# Patient Record
Sex: Female | Born: 1945 | Race: Black or African American
Health system: Southern US, Community
[De-identification: ages and names within clinical notes are randomized; demographics above are authoritative.]

## PROBLEM LIST (undated history)

## (undated) DIAGNOSIS — D649 Anemia, unspecified: Secondary | ICD-10-CM

## (undated) DIAGNOSIS — I499 Cardiac arrhythmia, unspecified: Secondary | ICD-10-CM

## (undated) DIAGNOSIS — J302 Other seasonal allergic rhinitis: Secondary | ICD-10-CM

## (undated) DIAGNOSIS — N289 Disorder of kidney and ureter, unspecified: Secondary | ICD-10-CM

## (undated) DIAGNOSIS — E119 Type 2 diabetes mellitus without complications: Secondary | ICD-10-CM

## (undated) DIAGNOSIS — F419 Anxiety disorder, unspecified: Secondary | ICD-10-CM

## (undated) DIAGNOSIS — M069 Rheumatoid arthritis, unspecified: Secondary | ICD-10-CM

## (undated) DIAGNOSIS — G629 Polyneuropathy, unspecified: Secondary | ICD-10-CM

## (undated) DIAGNOSIS — E079 Disorder of thyroid, unspecified: Secondary | ICD-10-CM

## (undated) DIAGNOSIS — I272 Pulmonary hypertension, unspecified: Secondary | ICD-10-CM

## (undated) DIAGNOSIS — E785 Hyperlipidemia, unspecified: Secondary | ICD-10-CM

## (undated) DIAGNOSIS — J349 Unspecified disorder of nose and nasal sinuses: Secondary | ICD-10-CM

## (undated) DIAGNOSIS — M13811 Other specified arthritis, right shoulder: Secondary | ICD-10-CM

## (undated) DIAGNOSIS — H9390 Unspecified disorder of ear, unspecified ear: Secondary | ICD-10-CM

## (undated) DIAGNOSIS — I10 Essential (primary) hypertension: Secondary | ICD-10-CM

## (undated) DIAGNOSIS — J392 Other diseases of pharynx: Secondary | ICD-10-CM

## (undated) DIAGNOSIS — E039 Hypothyroidism, unspecified: Secondary | ICD-10-CM

## (undated) DIAGNOSIS — F329 Major depressive disorder, single episode, unspecified: Secondary | ICD-10-CM

## (undated) DIAGNOSIS — F32A Depression, unspecified: Secondary | ICD-10-CM

## (undated) HISTORY — DX: Polyneuropathy, unspecified: G62.9

## (undated) HISTORY — DX: Hypothyroidism, unspecified: E03.9

## (undated) HISTORY — DX: Disorder of thyroid, unspecified: E07.9

## (undated) HISTORY — PX: ROTATOR CUFF REPAIR: SHX139

## (undated) HISTORY — DX: Type 2 diabetes mellitus without complications: E11.9

## (undated) HISTORY — DX: Disorder of kidney and ureter, unspecified: N28.9

## (undated) HISTORY — DX: Pulmonary hypertension, unspecified: I27.20

## (undated) HISTORY — PX: EYE SURGERY: SHX253

## (undated) HISTORY — PX: FINGER SURGERY: SHX640

## (undated) HISTORY — DX: Essential (primary) hypertension: I10

## (undated) HISTORY — DX: Anemia, unspecified: D64.9

## (undated) HISTORY — DX: Anxiety disorder, unspecified: F41.9

## (undated) HISTORY — PX: SHOULDER SURGERY: SHX246

## (undated) HISTORY — DX: Hyperlipidemia, unspecified: E78.5

## (undated) HISTORY — DX: Unspecified disorder of ear, unspecified ear: H93.90

## (undated) HISTORY — DX: Cardiac arrhythmia, unspecified: I49.9

## (undated) HISTORY — DX: Rheumatoid arthritis, unspecified: M06.9

## (undated) HISTORY — DX: Other diseases of pharynx: J39.2

## (undated) HISTORY — DX: Other specified arthritis, right shoulder: M13.811

## (undated) HISTORY — DX: Unspecified disorder of nose and nasal sinuses: J34.9

## (undated) HISTORY — PX: HYSTERECTOMY: SHX81

## (undated) HISTORY — DX: Other seasonal allergic rhinitis: J30.2

## (undated) HISTORY — PX: ABDOMINAL HYSTERECTOMY: SHX81

---

## 1994-12-31 ENCOUNTER — Ambulatory Visit: Admission: RE | Admit: 1994-12-31 | Payer: Self-pay | Source: Ambulatory Visit | Admitting: Pain Medicine

## 1995-01-14 ENCOUNTER — Ambulatory Visit: Admission: RE | Admit: 1995-01-14 | Payer: Self-pay | Source: Ambulatory Visit | Admitting: Pain Medicine

## 1995-01-28 ENCOUNTER — Ambulatory Visit: Admission: RE | Admit: 1995-01-28 | Payer: Self-pay | Source: Ambulatory Visit | Admitting: Pain Medicine

## 1996-07-11 ENCOUNTER — Emergency Department: Admit: 1996-07-11 | Disposition: A | Discharge status: Home / Self Care | Payer: Self-pay

## 1998-10-20 ENCOUNTER — Ambulatory Visit: Admit: 1998-10-20 | Disposition: A | Discharge status: Home / Self Care | Payer: Self-pay

## 1999-06-01 ENCOUNTER — Ambulatory Visit: Admit: 1999-06-01 | Disposition: A | Discharge status: Home / Self Care | Payer: Self-pay

## 2000-04-28 ENCOUNTER — Emergency Department: Admit: 2000-04-28 | Payer: Self-pay | Admitting: Emergency Medicine

## 2005-02-10 ENCOUNTER — Ambulatory Visit
Admission: RE | Admit: 2005-02-10 | Disposition: A | Discharge status: Home / Self Care | Payer: Self-pay | Source: Ambulatory Visit | Admitting: Internal Medicine

## 2005-03-06 ENCOUNTER — Ambulatory Visit
Admission: RE | Admit: 2005-03-06 | Disposition: A | Discharge status: Home / Self Care | Payer: Self-pay | Source: Ambulatory Visit | Admitting: Internal Medicine

## 2014-03-09 ENCOUNTER — Emergency Department (HOSPITAL_BASED_OUTPATIENT_CLINIC_OR_DEPARTMENT_OTHER)
Admission: EM | Admit: 2014-03-09 | Discharge: 2014-03-09 | Disposition: A | Discharge status: Home / Self Care | Payer: Medicare Other | Attending: Emergency Medicine | Admitting: Emergency Medicine

## 2014-03-09 ENCOUNTER — Encounter (HOSPITAL_BASED_OUTPATIENT_CLINIC_OR_DEPARTMENT_OTHER): Payer: Self-pay | Admitting: Emergency Medicine

## 2014-03-09 DIAGNOSIS — E119 Type 2 diabetes mellitus without complications: Secondary | ICD-10-CM | POA: Insufficient documentation

## 2014-03-09 DIAGNOSIS — F329 Major depressive disorder, single episode, unspecified: Secondary | ICD-10-CM | POA: Insufficient documentation

## 2014-03-09 DIAGNOSIS — Z79899 Other long term (current) drug therapy: Secondary | ICD-10-CM | POA: Insufficient documentation

## 2014-03-09 DIAGNOSIS — E785 Hyperlipidemia, unspecified: Secondary | ICD-10-CM | POA: Insufficient documentation

## 2014-03-09 DIAGNOSIS — Z7982 Long term (current) use of aspirin: Secondary | ICD-10-CM | POA: Insufficient documentation

## 2014-03-09 DIAGNOSIS — F411 Generalized anxiety disorder: Secondary | ICD-10-CM | POA: Insufficient documentation

## 2014-03-09 DIAGNOSIS — M069 Rheumatoid arthritis, unspecified: Secondary | ICD-10-CM | POA: Insufficient documentation

## 2014-03-09 DIAGNOSIS — J0101 Acute recurrent maxillary sinusitis: Secondary | ICD-10-CM

## 2014-03-09 DIAGNOSIS — H109 Unspecified conjunctivitis: Secondary | ICD-10-CM | POA: Insufficient documentation

## 2014-03-09 DIAGNOSIS — F3289 Other specified depressive episodes: Secondary | ICD-10-CM | POA: Insufficient documentation

## 2014-03-09 DIAGNOSIS — Z880 Allergy status to penicillin: Secondary | ICD-10-CM | POA: Insufficient documentation

## 2014-03-09 DIAGNOSIS — E079 Disorder of thyroid, unspecified: Secondary | ICD-10-CM | POA: Insufficient documentation

## 2014-03-09 DIAGNOSIS — J0100 Acute maxillary sinusitis, unspecified: Secondary | ICD-10-CM | POA: Insufficient documentation

## 2014-03-09 DIAGNOSIS — Z792 Long term (current) use of antibiotics: Secondary | ICD-10-CM | POA: Insufficient documentation

## 2014-03-09 HISTORY — DX: Rheumatoid arthritis, unspecified: M06.9

## 2014-03-09 HISTORY — DX: Type 2 diabetes mellitus without complications: E11.9

## 2014-03-09 HISTORY — DX: Major depressive disorder, single episode, unspecified: F32.9

## 2014-03-09 HISTORY — DX: Hyperlipidemia, unspecified: E78.5

## 2014-03-09 HISTORY — DX: Depression, unspecified: F32.A

## 2014-03-09 HISTORY — DX: Disorder of thyroid, unspecified: E07.9

## 2014-03-09 HISTORY — DX: Anxiety disorder, unspecified: F41.9

## 2014-03-09 MED ORDER — TOBRAMYCIN 0.3 % OP SOLN: 2.0000 [drp] | OPHTHALMIC | Status: DC

## 2014-03-09 MED ORDER — CEPHALEXIN 500 MG PO CAPS: 500.0000 mg | ORAL_CAPSULE | Freq: Four times a day (QID) | ORAL | Status: DC

## 2014-03-09 NOTE — ED Provider Notes (Signed)
CSN: [id number]     Arrival date & time 03/09/14  2131 History   First MD Initiated Contact with Patient 03/09/14 2246     Chief Complaint  Patient presents with  . Eye Drainage     (Consider location/radiation/quality/duration/timing/severity/associated sxs/prior Treatment) Patient is a 68 y.o. female presenting with conjunctivitis. The history is provided by the patient. No language interpreter was used.  Conjunctivitis This is a new problem. The current episode started today. The problem occurs constantly. The problem has been unchanged. Nothing aggravates the symptoms. She has tried nothing for the symptoms. The treatment provided moderate relief.    Past Medical History  Diagnosis Date  . Diabetes mellitus without complication   . Rheumatoid arthritis   . Hyperlipidemia   . Anxiety   . Depression   . Thyroid disease    Past Surgical History  Procedure Laterality Date  . Abdominal hysterectomy     History reviewed. No pertinent family history. History  Substance Use Topics  . Smoking status: Never Smoker   . Smokeless tobacco: Not on file  . Alcohol Use: No   OB History   Grav Para Term Preterm Abortions TAB SAB Ect Mult Living                 Review of Systems  Eyes: Positive for pain, discharge, redness and itching.  All other systems reviewed and are negative.     Allergies  Penicillins and Sulfa antibiotics  Home Medications   Prior to Admission medications   Medication Sig Start Date End Date Taking? Authorizing Provider  aspirin 81 MG tablet Take 81 mg by mouth daily.   Yes Historical Provider, MD  DULoxetine (CYMBALTA) 60 MG capsule Take 60 mg by mouth daily.   Yes Historical Provider, MD  levothyroxine (SYNTHROID, LEVOTHROID) 25 MCG tablet Take 25 mcg by mouth daily before breakfast.   Yes Historical Provider, MD  LORazepam (ATIVAN) 1 MG tablet Take 1 mg by mouth every 6 (six) hours as needed for anxiety.   Yes Historical Provider, MD  Lorcaserin  HCl (BELVIQ) 10 MG TABS Take by mouth daily.   Yes Historical Provider, MD  losartan-hydrochlorothiazide (HYZAAR) 100-25 MG per tablet Take 1 tablet by mouth daily.   Yes Historical Provider, MD  metoprolol succinate (TOPROL-XL) 50 MG 24 hr tablet Take 50 mg by mouth daily. Take with or immediately following a meal.   Yes Historical Provider, MD  simvastatin (ZOCOR) 20 MG tablet Take 20 mg by mouth daily.   Yes Historical Provider, MD  cephALEXin (KEFLEX) 500 MG capsule Take 1 capsule (500 mg total) by mouth 4 (four) times daily. 03/09/14   Elson Areas, PA-C  tobramycin (TOBREX) 0.3 % ophthalmic solution Place 2 drops into the left eye every 4 (four) hours. 03/09/14   Elson Areas, PA-C   BP 173/71  Pulse 84  Temp(Src) 97.7 F (36.5 C) (Oral)  Resp 20  Ht 5\' 1"  (1.549 m)  Wt 155 lb (70.308 kg)  BMI 29.30 kg/m2 Physical Exam  Nursing note and vitals reviewed. Constitutional: She is oriented to person, place, and time. She appears well-developed and well-nourished.  HENT:  Head: Normocephalic and atraumatic.  Eyes: Conjunctivae and EOM are normal. Pupils are equal, round, and reactive to light.  Injected left eye.   Tender maxillary sinuses  Cardiovascular: Normal rate.   Pulmonary/Chest: Effort normal.  Neurological: She is alert and oriented to person, place, and time.  Skin: Skin is warm.  Psychiatric: She  has a normal mood and affect.    ED Course  Procedures (including critical care time) Labs Review Labs Reviewed - No data to display  Imaging Review No results found.   EKG Interpretation None      MDM   Final diagnoses:  Conjunctivitis of left eye  Acute recurrent maxillary sinusitis    Keflex tobrex opth solution    Elson Areas, PA-C 03/09/14 2306

## 2014-03-09 NOTE — Discharge Instructions (Signed)
Bacterial Conjunctivitis Bacterial conjunctivitis, commonly called pink eye, is an inflammation of the clear membrane that covers the white part of the eye (conjunctiva). The inflammation can also happen on the underside of the eyelids. The blood vessels in the conjunctiva become inflamed causing the eye to become red or pink. Bacterial conjunctivitis may spread easily from one eye to another and from person to person (contagious).  CAUSES  Bacterial conjunctivitis is caused by bacteria. The bacteria may come from your own skin, your upper respiratory tract, or from someone else with bacterial conjunctivitis. SYMPTOMS  The normally white color of the eye or the underside of the eyelid is usually pink or red. The pink eye is usually associated with irritation, tearing, and some sensitivity to light. Bacterial conjunctivitis is often associated with a thick, yellowish discharge from the eye. The discharge may turn into a crust on the eyelids overnight, which causes your eyelids to stick together. If a discharge is present, there may also be some blurred vision in the affected eye. DIAGNOSIS  Bacterial conjunctivitis is diagnosed by your caregiver through an eye exam and the symptoms that you report. Your caregiver looks for changes in the surface tissues of your eyes, which may point to the specific type of conjunctivitis. A sample of any discharge may be collected on a cotton-tip swab if you have a severe case of conjunctivitis, if your cornea is affected, or if you keep getting repeat infections that do not respond to treatment. The sample will be sent to a lab to see if the inflammation is caused by a bacterial infection and to see if the infection will respond to antibiotic medicines. TREATMENT   Bacterial conjunctivitis is treated with antibiotics. Antibiotic eyedrops are most often used. However, antibiotic ointments are also available. Antibiotics pills are sometimes used. Artificial tears or eye  washes may ease discomfort. HOME CARE INSTRUCTIONS   To ease discomfort, apply a cool, clean wash cloth to your eye for 10-20 minutes, 3-4 times a day.  Gently wipe away any drainage from your eye with a warm, wet washcloth or a cotton ball.  Wash your hands often with soap and water. Use paper towels to dry your hands.  Do not share towels or wash cloths. This may spread the infection.  Change or wash your pillow case every day.  You should not use eye makeup until the infection is gone.  Do not operate machinery or drive if your vision is blurred.  Stop using contacts lenses. Ask your caregiver how to sterilize or replace your contacts before using them again. This depends on the type of contact lenses that you use.  When applying medicine to the infected eye, do not touch the edge of your eyelid with the eyedrop bottle or ointment tube. SEEK IMMEDIATE MEDICAL CARE IF:   Your infection has not improved within 3 days after beginning treatment.  You had yellow discharge from your eye and it returns.  You have increased eye pain.  Your eye redness is spreading.  Your vision becomes blurred.  You have a fever or persistent symptoms for more than 2-3 days.  You have a fever and your symptoms suddenly get worse.  You have facial pain, redness, or swelling. MAKE SURE YOU:   Understand these instructions.  Will watch your condition.  Will get help right away if you are not doing well or get worse. Document Released: 08/23/2005 Document Revised: 05/17/2012 Document Reviewed: 01/24/2012 [facility] Patient Information 2015 [place], [place]. This information is  not intended to replace advice given to you by your health care provider. Make sure you discuss any questions you have with your health care provider. Sinusitis Sinusitis is redness, soreness, and swelling (inflammation) of the paranasal sinuses. Paranasal sinuses are air pockets within the bones of your face (beneath the  eyes, the middle of the forehead, or above the eyes). In healthy paranasal sinuses, mucus is able to drain out, and air is able to circulate through them by way of your nose. However, when your paranasal sinuses are inflamed, mucus and air can become trapped. This can allow bacteria and other germs to grow and cause infection. Sinusitis can develop quickly and last only a short time (acute) or continue over a long period (chronic). Sinusitis that lasts for more than 12 weeks is considered chronic.  CAUSES  Causes of sinusitis include:  Allergies.  Structural abnormalities, such as displacement of the cartilage that separates your nostrils (deviated septum), which can decrease the air flow through your nose and sinuses and affect sinus drainage.  Functional abnormalities, such as when the small hairs (cilia) that line your sinuses and help remove mucus do not work properly or are not present. SYMPTOMS  Symptoms of acute and chronic sinusitis are the same. The primary symptoms are pain and pressure around the affected sinuses. Other symptoms include:  Upper toothache.  Earache.  Headache.  Bad breath.  Decreased sense of smell and taste.  A cough, which worsens when you are lying flat.  Fatigue.  Fever.  Thick drainage from your nose, which often is green and may contain pus (purulent).  Swelling and warmth over the affected sinuses. DIAGNOSIS  Your caregiver will perform a physical exam. During the exam, your caregiver may:  Look in your nose for signs of abnormal growths in your nostrils (nasal polyps).  Tap over the affected sinus to check for signs of infection.  View the inside of your sinuses (endoscopy) with a special imaging device with a light attached (endoscope), which is inserted into your sinuses. If your caregiver suspects that you have chronic sinusitis, one or more of the following tests may be recommended:  Allergy tests.  Nasal culture--A sample of mucus is  taken from your nose and sent to a lab and screened for bacteria.  Nasal cytology--A sample of mucus is taken from your nose and examined by your caregiver to determine if your sinusitis is related to an allergy. TREATMENT  Most cases of acute sinusitis are related to a viral infection and will resolve on their own within 10 days. Sometimes medicines are prescribed to help relieve symptoms (pain medicine, decongestants, nasal steroid sprays, or saline sprays).  However, for sinusitis related to a bacterial infection, your caregiver will prescribe antibiotic medicines. These are medicines that will help kill the bacteria causing the infection.  Rarely, sinusitis is caused by a fungal infection. In theses cases, your caregiver will prescribe antifungal medicine. For some cases of chronic sinusitis, surgery is needed. Generally, these are cases in which sinusitis recurs more than 3 times per year, despite other treatments. HOME CARE INSTRUCTIONS   Drink plenty of water. Water helps thin the mucus so your sinuses can drain more easily.  Use a humidifier.  Inhale steam 3 to 4 times a day (for example, sit in the bathroom with the shower running).  Apply a warm, moist washcloth to your face 3 to 4 times a day, or as directed by your caregiver.  Use saline nasal sprays to help  moisten and clean your sinuses.  Take over-the-counter or prescription medicines for pain, discomfort, or fever only as directed by your caregiver. SEEK IMMEDIATE MEDICAL CARE IF:  You have increasing pain or severe headaches.  You have nausea, vomiting, or drowsiness.  You have swelling around your face.  You have vision problems.  You have a stiff neck.  You have difficulty breathing. MAKE SURE YOU:   Understand these instructions.  Will watch your condition.  Will get help right away if you are not doing well or get worse. Document Released: 08/23/2005 Document Revised: 11/15/2011 Document Reviewed:  09/07/2011 [facility] Patient Information 2015 [place], [place]. This information is not intended to replace advice given to you by your health care provider. Make sure you discuss any questions you have with your health care provider.

## 2014-03-09 NOTE — ED Notes (Addendum)
Pt reports left eye drainage, irritation, itching, that started this morning. Pt reports decreased vision to left eye.

## 2014-03-09 NOTE — ED Notes (Signed)
Assumed care of patient from Rachel, RN.

## 2014-03-11 NOTE — ED Provider Notes (Signed)
Medical screening examination/treatment/procedure(s) were performed by non-physician practitioner and as supervising physician I was immediately available for consultation/collaboration.   EKG Interpretation None        Audree Camel, MD 03/11/14 1208

## 2014-09-06 ENCOUNTER — Emergency Department (HOSPITAL_BASED_OUTPATIENT_CLINIC_OR_DEPARTMENT_OTHER): Payer: Medicare Other

## 2014-09-06 ENCOUNTER — Inpatient Hospital Stay (HOSPITAL_BASED_OUTPATIENT_CLINIC_OR_DEPARTMENT_OTHER)
Admission: EM | Admit: 2014-09-06 | Discharge: 2014-09-07 | DRG: 563 | Disposition: A | Discharge status: Home / Self Care | Payer: Medicare Other | Attending: Orthopedic Surgery | Admitting: Orthopedic Surgery

## 2014-09-06 ENCOUNTER — Encounter (HOSPITAL_BASED_OUTPATIENT_CLINIC_OR_DEPARTMENT_OTHER): Payer: Self-pay | Admitting: *Deleted

## 2014-09-06 DIAGNOSIS — F329 Major depressive disorder, single episode, unspecified: Secondary | ICD-10-CM | POA: Diagnosis present

## 2014-09-06 DIAGNOSIS — S43004A Unspecified dislocation of right shoulder joint, initial encounter: Secondary | ICD-10-CM

## 2014-09-06 DIAGNOSIS — S43016A Anterior dislocation of unspecified humerus, initial encounter: Secondary | ICD-10-CM | POA: Diagnosis present

## 2014-09-06 DIAGNOSIS — M069 Rheumatoid arthritis, unspecified: Secondary | ICD-10-CM | POA: Diagnosis present

## 2014-09-06 DIAGNOSIS — S43014A Anterior dislocation of right humerus, initial encounter: Principal | ICD-10-CM | POA: Diagnosis present

## 2014-09-06 DIAGNOSIS — E119 Type 2 diabetes mellitus without complications: Secondary | ICD-10-CM | POA: Diagnosis present

## 2014-09-06 DIAGNOSIS — R52 Pain, unspecified: Secondary | ICD-10-CM

## 2014-09-06 DIAGNOSIS — W1830XA Fall on same level, unspecified, initial encounter: Secondary | ICD-10-CM | POA: Diagnosis present

## 2014-09-06 DIAGNOSIS — W19XXXA Unspecified fall, initial encounter: Secondary | ICD-10-CM

## 2014-09-06 DIAGNOSIS — I10 Essential (primary) hypertension: Secondary | ICD-10-CM | POA: Diagnosis present

## 2014-09-06 DIAGNOSIS — M79601 Pain in right arm: Secondary | ICD-10-CM | POA: Diagnosis not present

## 2014-09-06 DIAGNOSIS — Z7982 Long term (current) use of aspirin: Secondary | ICD-10-CM

## 2014-09-06 DIAGNOSIS — F419 Anxiety disorder, unspecified: Secondary | ICD-10-CM | POA: Diagnosis present

## 2014-09-06 DIAGNOSIS — E785 Hyperlipidemia, unspecified: Secondary | ICD-10-CM | POA: Diagnosis present

## 2014-09-06 MED ORDER — KETOROLAC TROMETHAMINE 30 MG/ML IJ SOLN
30.0000 mg | Freq: Once | INTRAMUSCULAR | Status: AC
  Administered 2014-09-06: 30 mg via INTRAVENOUS
  Filled 2014-09-06: qty 1

## 2014-09-06 MED ORDER — FENTANYL CITRATE 0.05 MG/ML IJ SOLN
100.0000 ug | Freq: Once | INTRAMUSCULAR | Status: AC
  Administered 2014-09-06: 100 ug via INTRAVENOUS
  Filled 2014-09-06: qty 2

## 2014-09-06 NOTE — ED Notes (Signed)
Dr. Palumbo at bedside. 

## 2014-09-06 NOTE — ED Notes (Signed)
"  Was throwing a toy for the dog, lost balance fell over furniture into the kitchen". C/o R arm pain. H/o R arm pain d/t RA. Pinpoints pain to posterior shoulder blade area to shoulder and down to FA. Mentions some back soreness. (denies: neck pain, hitting head or nv).  Took an unknown pain pill PTA (1 pill), rates R arm pain 10/10.

## 2014-09-06 NOTE — ED Provider Notes (Signed)
CSN: [id number]     Arrival date & time 09/06/14  2126 History  This chart was scribed for Kayla Case Smitty Cords, MD by Bronson Curb, ED Scribe. This patient was seen in room MH09/MH09 and the patient's care was started at 11:34 PM.   Chief Complaint  Patient presents with  . Fall  . Arm Injury    Patient is a 69 y.o. female presenting with fall and arm injury. The history is provided by the patient. No language interpreter was used.  Fall This is a new problem. The current episode started 1 to 2 hours ago. The problem has not changed since onset.Pertinent negatives include no chest pain, no abdominal pain, no headaches and no shortness of breath. Nothing aggravates the symptoms. Nothing relieves the symptoms. She has tried nothing for the symptoms.  Arm Injury Location:  Shoulder and arm Time since incident:  3 hours Injury: yes   Mechanism of injury: fall   Fall:    Fall occurred:  Tripped and recreating/playing   Height of fall:  From standing   Impact surface:  Furniture and hard floor   Entrapped after fall: no   Shoulder location:  R shoulder Arm location:  R arm Pain details:    Radiates to:  Does not radiate   Severity:  Moderate   Onset quality:  Sudden   Duration:  3 hours   Timing:  Constant   Progression:  Unchanged Chronicity:  New Dislocation: yes   Prior injury to area:  No Relieved by:  None tried Worsened by:  Nothing tried Ineffective treatments:  None tried    HPI Comments: Kayla Case is a 69 y.o. female who presents to the Emergency Department complaining of fall that occurred 2.5 hours ago. Patient states she was throwing a toy to her dog when she lost her balance and fell over the furniture and into the kitchen. She denies head injury or LOC. There is associated constant, moderate right arm and right posterior shoulder pain. She reports that her last time eating was 6 hours ago and last time drinking was about 1 hour ago. Patient has history of  rheumatoid arthritis, DM, thyroid disease, and HTN. She notes compliance with all medications and denies any complications. Patient denies any other injuries.   Past Medical History  Diagnosis Date  . Diabetes mellitus without complication   . Rheumatoid arthritis   . Hyperlipidemia   . Anxiety   . Depression   . Thyroid disease    Past Surgical History  Procedure Laterality Date  . Abdominal hysterectomy     No family history on file. History  Substance Use Topics  . Smoking status: Never Smoker   . Smokeless tobacco: Not on file  . Alcohol Use: No   OB History    No data available     Review of Systems  Respiratory: Negative for shortness of breath.   Cardiovascular: Negative for chest pain.  Gastrointestinal: Negative for abdominal pain.  Musculoskeletal: Positive for myalgias (right arm) and arthralgias (right shoulder).  Neurological: Negative for syncope and headaches.  All other systems reviewed and are negative.     Allergies  Penicillins and Sulfa antibiotics  Home Medications   Prior to Admission medications   Medication Sig Start Date End Date Taking? Authorizing Provider  aspirin 81 MG tablet Take 81 mg by mouth daily.    Historical Provider, MD  cephALEXin (KEFLEX) 500 MG capsule Take 1 capsule (500 mg total) by mouth 4 (four) times  daily. 03/09/14   Elson Areas, PA-C  DULoxetine (CYMBALTA) 60 MG capsule Take 60 mg by mouth daily.    Historical Provider, MD  levothyroxine (SYNTHROID, LEVOTHROID) 25 MCG tablet Take 25 mcg by mouth daily before breakfast.    Historical Provider, MD  LORazepam (ATIVAN) 1 MG tablet Take 1 mg by mouth every 6 (six) hours as needed for anxiety.    Historical Provider, MD  Lorcaserin HCl (BELVIQ) 10 MG TABS Take by mouth daily.    Historical Provider, MD  losartan-hydrochlorothiazide (HYZAAR) 100-25 MG per tablet Take 1 tablet by mouth daily.    Historical Provider, MD  metoprolol succinate (TOPROL-XL) 50 MG 24 hr tablet  Take 50 mg by mouth daily. Take with or immediately following a meal.    Historical Provider, MD  simvastatin (ZOCOR) 20 MG tablet Take 20 mg by mouth daily.    Historical Provider, MD  tobramycin (TOBREX) 0.3 % ophthalmic solution Place 2 drops into the left eye every 4 (four) hours. 03/09/14   Elson Areas, PA-C   Triage Vitals: BP 167/63 mmHg  Pulse 89  Temp(Src) 98.3 F (36.8 C) (Oral)  Resp 20  Ht 5\' 1"  (1.549 m)  Wt 165 lb (74.844 kg)  BMI 31.19 kg/m2  SpO2 97%  Physical Exam  Constitutional: She is oriented to person, place, and time. She appears well-developed and well-nourished. No distress.  HENT:  Head: Normocephalic and atraumatic.  Mouth/Throat: Oropharynx is clear and moist. No oropharyngeal exudate.  Eyes: Conjunctivae and EOM are normal.  Neck: Neck supple. No tracheal deviation present.  Cardiovascular: Normal rate, regular rhythm and normal heart sounds.   Pulmonary/Chest: Effort normal and breath sounds normal. No respiratory distress.  Musculoskeletal: Normal range of motion.  Neurological: She is alert and oriented to person, place, and time.  Skin: Skin is warm and dry.  Psychiatric: She has a normal mood and affect. Her behavior is normal.  Nursing note and vitals reviewed.   ED Course  ORTHOPEDIC INJURY TREATMENT Date/Time: 09/07/2014 2:04 AM Performed by: 11/06/2014 Authorized by: Jasmine Awe Consent: Verbal consent obtained. Patient identity confirmed: arm band Injury location: shoulder Location details: right shoulder Injury type: fracture-dislocation Dislocation type: anterior Pre-procedure distal perfusion: normal Pre-procedure neurological function: normal Pre-procedure range of motion: reduced Patient sedated: no Manipulation performed: yes Skeletal traction used: yes Reduction successful: no Immobilization: sling Post-procedure distal perfusion: normal Post-procedure neurological function: normal Post-procedure  range of motion: unchanged Patient tolerance: Patient tolerated the procedure well with no immediate complications Comments: Weights  Scapula manipulation Countertraction External rotation, transiently in x2    (including critical care time)  DIAGNOSTIC STUDIES: Oxygen Saturation is 97% on room air, adequate by my interpretation.    COORDINATION OF CARE: 11:37 PM- Pt advised of plan for treatment and pt agrees.    Labs Review Labs Reviewed - No data to display  Imaging Review Dg Humerus Right  09/06/2014   CLINICAL DATA:  Fall, landing on shoulder. Complains of pain in the right shoulder and limited range of motion.  EXAM: RIGHT HUMERUS - 2+ VIEW  COMPARISON:  None.  FINDINGS: Anterior inferior dislocation of the right shoulder with Hill-Sachs impaction fracture along the lateral humeral head. Fragmentation of the inferior glenoid likely represents a Bankart fracture. Degenerative changes in the acromioclavicular joint with subacromial spurring.  IMPRESSION: Anterior dislocation of the right shoulder with Hill-Sachs and Bankart lesions suggested. Underlying degenerative changes.   Electronically Signed   By: 11/05/2014.D.  On: 09/06/2014 22:54     EKG Interpretation None      MDM   Final diagnoses:  Fall    Case d/w Dr. Carola Frost admit [facility] under observation.  Obtain CT  I personally performed the services described in this documentation, which was scribed in my presence. The recorded information has been reviewed and is accurate.     Jasmine Awe, MD 09/07/14 [phone]

## 2014-09-07 ENCOUNTER — Encounter (HOSPITAL_COMMUNITY)
Admission: EM | Disposition: A | Discharge status: Home / Self Care | Payer: Self-pay | Source: Home / Self Care | Attending: Orthopedic Surgery

## 2014-09-07 ENCOUNTER — Emergency Department (HOSPITAL_BASED_OUTPATIENT_CLINIC_OR_DEPARTMENT_OTHER): Payer: Medicare Other

## 2014-09-07 ENCOUNTER — Inpatient Hospital Stay (HOSPITAL_COMMUNITY): Payer: Medicare Other | Admitting: Anesthesiology

## 2014-09-07 ENCOUNTER — Encounter (HOSPITAL_COMMUNITY): Payer: Self-pay | Admitting: Orthopedic Surgery

## 2014-09-07 ENCOUNTER — Inpatient Hospital Stay (HOSPITAL_COMMUNITY): Payer: Medicare Other

## 2014-09-07 DIAGNOSIS — E785 Hyperlipidemia, unspecified: Secondary | ICD-10-CM | POA: Diagnosis present

## 2014-09-07 DIAGNOSIS — Z7982 Long term (current) use of aspirin: Secondary | ICD-10-CM | POA: Diagnosis not present

## 2014-09-07 DIAGNOSIS — E119 Type 2 diabetes mellitus without complications: Secondary | ICD-10-CM | POA: Diagnosis present

## 2014-09-07 DIAGNOSIS — S43016A Anterior dislocation of unspecified humerus, initial encounter: Secondary | ICD-10-CM | POA: Diagnosis present

## 2014-09-07 DIAGNOSIS — M069 Rheumatoid arthritis, unspecified: Secondary | ICD-10-CM | POA: Diagnosis present

## 2014-09-07 DIAGNOSIS — M79601 Pain in right arm: Secondary | ICD-10-CM | POA: Diagnosis present

## 2014-09-07 DIAGNOSIS — F419 Anxiety disorder, unspecified: Secondary | ICD-10-CM | POA: Diagnosis present

## 2014-09-07 DIAGNOSIS — F329 Major depressive disorder, single episode, unspecified: Secondary | ICD-10-CM | POA: Diagnosis present

## 2014-09-07 DIAGNOSIS — S43014A Anterior dislocation of right humerus, initial encounter: Secondary | ICD-10-CM | POA: Diagnosis present

## 2014-09-07 DIAGNOSIS — W1830XA Fall on same level, unspecified, initial encounter: Secondary | ICD-10-CM | POA: Diagnosis present

## 2014-09-07 DIAGNOSIS — I10 Essential (primary) hypertension: Secondary | ICD-10-CM | POA: Diagnosis present

## 2014-09-07 HISTORY — PX: SHOULDER CLOSED REDUCTION: SHX1051

## 2014-09-07 LAB — CBC WITH DIFFERENTIAL/PLATELET
Basophils Absolute: 0 10*3/uL (ref 0.0–0.1)
Basophils Relative: 0 % (ref 0–1)
EOS ABS: 0.1 10*3/uL (ref 0.0–0.7)
Eosinophils Relative: 1 % (ref 0–5)
HCT: 34.2 % — ABNORMAL LOW (ref 36.0–46.0)
Hemoglobin: 11.6 g/dL — ABNORMAL LOW (ref 12.0–15.0)
Lymphocytes Relative: 14 % (ref 12–46)
Lymphs Abs: 1.3 10*3/uL (ref 0.7–4.0)
MCH: 29.4 pg (ref 26.0–34.0)
MCHC: 33.9 g/dL (ref 30.0–36.0)
MCV: 86.6 fL (ref 78.0–100.0)
MONOS PCT: 5 % (ref 3–12)
Monocytes Absolute: 0.5 10*3/uL (ref 0.1–1.0)
Neutro Abs: 7.5 10*3/uL (ref 1.7–7.7)
Neutrophils Relative %: 80 % — ABNORMAL HIGH (ref 43–77)
PLATELETS: 212 10*3/uL (ref 150–400)
RBC: 3.95 MIL/uL (ref 3.87–5.11)
RDW: 14 % (ref 11.5–15.5)
WBC: 9.5 10*3/uL (ref 4.0–10.5)

## 2014-09-07 LAB — GLUCOSE, CAPILLARY
GLUCOSE-CAPILLARY: 172 mg/dL — AB (ref 70–99)
GLUCOSE-CAPILLARY: 168 mg/dL — AB (ref 70–99)
Glucose-Capillary: 153 mg/dL — ABNORMAL HIGH (ref 70–99)
Glucose-Capillary: 144 mg/dL — ABNORMAL HIGH (ref 70–99)
Glucose-Capillary: 139 mg/dL — ABNORMAL HIGH (ref 70–99)

## 2014-09-07 LAB — BASIC METABOLIC PANEL
Anion gap: 8 (ref 5–15)
BUN: 28 mg/dL — AB (ref 6–23)
CALCIUM: 9.1 mg/dL (ref 8.4–10.5)
CO2: 30 mmol/L (ref 19–32)
CREATININE: 1.26 mg/dL — AB (ref 0.50–1.10)
Chloride: 102 mEq/L (ref 96–112)
GFR calc Af Amer: 50 mL/min — ABNORMAL LOW (ref >90)
GFR, EST NON AFRICAN AMERICAN: 43 mL/min — AB (ref >90)
GLUCOSE: 349 mg/dL — AB (ref 70–99)
Potassium: 3.5 mmol/L (ref 3.5–5.1)
SODIUM: 140 mmol/L (ref 135–145)

## 2014-09-07 LAB — SURGICAL PCR SCREEN
MRSA, PCR: NEGATIVE
STAPHYLOCOCCUS AUREUS: NEGATIVE

## 2014-09-07 SURGERY — CLOSED REDUCTION, SHOULDER
Anesthesia: General | Site: Shoulder | Laterality: Right

## 2014-09-07 MED ORDER — EPHEDRINE SULFATE 50 MG/ML IJ SOLN
INTRAMUSCULAR | Status: AC
  Filled 2014-09-07: qty 1

## 2014-09-07 MED ORDER — LACTATED RINGERS IV SOLN
INTRAVENOUS | Status: DC | PRN
  Administered 2014-09-07: 17:00:00 via INTRAVENOUS

## 2014-09-07 MED ORDER — HYDROCODONE-ACETAMINOPHEN 5-325 MG PO TABS
1.0000 | ORAL_TABLET | ORAL | Status: DC | PRN
  Administered 2014-09-07: 2 via ORAL
  Filled 2014-09-07: qty 2

## 2014-09-07 MED ORDER — SODIUM CHLORIDE 0.9 % IJ SOLN
3.0000 mL | Freq: Two times a day (BID) | INTRAMUSCULAR | Status: DC
  Administered 2014-09-07 (×2): 3 mL via INTRAVENOUS
  Filled 2014-09-07: qty 3

## 2014-09-07 MED ORDER — FENTANYL CITRATE 0.05 MG/ML IJ SOLN
INTRAMUSCULAR | Status: DC | PRN
  Administered 2014-09-07 (×2): 50 ug via INTRAVENOUS

## 2014-09-07 MED ORDER — PROMETHAZINE HCL 25 MG/ML IJ SOLN: 6.2500 mg | INTRAMUSCULAR | Status: DC | PRN

## 2014-09-07 MED ORDER — EPINEPHRINE HCL 0.1 MG/ML IJ SOSY
PREFILLED_SYRINGE | INTRAMUSCULAR | Status: AC
  Filled 2014-09-07: qty 10

## 2014-09-07 MED ORDER — OXYCODONE-ACETAMINOPHEN 5-325 MG PO TABS: 1.0000 | ORAL_TABLET | ORAL | Status: AC | PRN

## 2014-09-07 MED ORDER — HYDROCODONE-ACETAMINOPHEN 5-325 MG PO TABS: 1.0000 | ORAL_TABLET | Freq: Four times a day (QID) | ORAL | Status: DC | PRN

## 2014-09-07 MED ORDER — PROPOFOL 10 MG/ML IV BOLUS
INTRAVENOUS | Status: DC | PRN
  Administered 2014-09-07: 150 mg via INTRAVENOUS

## 2014-09-07 MED ORDER — LACTATED RINGERS IV SOLN
INTRAVENOUS | Status: DC
  Administered 2014-09-07: 16:00:00 via INTRAVENOUS

## 2014-09-07 MED ORDER — LIDOCAINE HCL (CARDIAC) 20 MG/ML IV SOLN
INTRAVENOUS | Status: DC | PRN
  Administered 2014-09-07: 50 mg via INTRAVENOUS

## 2014-09-07 MED ORDER — ACETAMINOPHEN 650 MG RE SUPP: 650.0000 mg | Freq: Four times a day (QID) | RECTAL | Status: DC | PRN

## 2014-09-07 MED ORDER — ONDANSETRON HCL 4 MG/2ML IJ SOLN: 4.0000 mg | Freq: Four times a day (QID) | INTRAMUSCULAR | Status: DC | PRN

## 2014-09-07 MED ORDER — SODIUM CHLORIDE 0.9 % IJ SOLN
3.0000 mL | INTRAMUSCULAR | Status: DC | PRN
  Filled 2014-09-07: qty 3

## 2014-09-07 MED ORDER — ONDANSETRON HCL 4 MG/2ML IJ SOLN
INTRAMUSCULAR | Status: AC
  Filled 2014-09-07: qty 2

## 2014-09-07 MED ORDER — FENTANYL CITRATE 0.05 MG/ML IJ SOLN
INTRAMUSCULAR | Status: AC
  Filled 2014-09-07: qty 5

## 2014-09-07 MED ORDER — ALUM & MAG HYDROXIDE-SIMETH 200-200-20 MG/5ML PO SUSP: 30.0000 mL | Freq: Four times a day (QID) | ORAL | Status: DC | PRN

## 2014-09-07 MED ORDER — ROCURONIUM BROMIDE 50 MG/5ML IV SOLN
INTRAVENOUS | Status: AC
  Filled 2014-09-07: qty 1

## 2014-09-07 MED ORDER — FENTANYL CITRATE 0.05 MG/ML IJ SOLN
100.0000 ug | Freq: Once | INTRAMUSCULAR | Status: AC
  Administered 2014-09-07: 100 ug via INTRAVENOUS
  Filled 2014-09-07: qty 2

## 2014-09-07 MED ORDER — SODIUM CHLORIDE 0.9 % IV SOLN
250.0000 mL | INTRAVENOUS | Status: DC | PRN
  Administered 2014-09-07: 250 mL via INTRAVENOUS

## 2014-09-07 MED ORDER — LIDOCAINE HCL (CARDIAC) 20 MG/ML IV SOLN
INTRAVENOUS | Status: AC
  Filled 2014-09-07: qty 15

## 2014-09-07 MED ORDER — SUCCINYLCHOLINE CHLORIDE 20 MG/ML IJ SOLN
INTRAMUSCULAR | Status: AC
  Filled 2014-09-07: qty 1

## 2014-09-07 MED ORDER — ONDANSETRON HCL 4 MG PO TABS
4.0000 mg | ORAL_TABLET | Freq: Four times a day (QID) | ORAL | Status: DC | PRN
Start: 2014-09-07 — End: 2014-09-07

## 2014-09-07 MED ORDER — ACETAMINOPHEN 325 MG PO TABS: 650.0000 mg | ORAL_TABLET | Freq: Four times a day (QID) | ORAL | Status: DC | PRN

## 2014-09-07 MED ORDER — HYDROMORPHONE HCL 1 MG/ML IJ SOLN
1.0000 mg | Freq: Once | INTRAMUSCULAR | Status: AC
  Administered 2014-09-07: 1 mg via INTRAVENOUS
  Filled 2014-09-07: qty 1

## 2014-09-07 MED ORDER — FENTANYL CITRATE 0.05 MG/ML IJ SOLN
25.0000 ug | INTRAMUSCULAR | Status: DC | PRN
Start: 2014-09-07 — End: 2014-09-07

## 2014-09-07 MED ORDER — MORPHINE SULFATE 2 MG/ML IJ SOLN
1.0000 mg | INTRAMUSCULAR | Status: DC | PRN
  Administered 2014-09-07 (×4): 2 mg via INTRAVENOUS
  Filled 2014-09-07 (×4): qty 1

## 2014-09-07 NOTE — Discharge Summary (Signed)
Discharged to home in stable condition. Final diagnosis dislocation right shoulder. Surgical procedure closed reduction right shoulder. Follow-up in the office in 2 weeks. Prescription for Vicodin and Percocet for pain.

## 2014-09-07 NOTE — Progress Notes (Signed)
Occupational Therapy Evaluation Patient Details Name: Kayla Case MRN: [id number] DOB: August 17, 1946 Today's Date: 09/07/2014    History of Present Illness  s/p fall with resulting R shoulder dislocation. Per chart, unable to successfully reduce in ED. CT - +inferior dislocation positioned medially on glenoid with apparent Bankhart lesion. Awaiting consult from shoulder MD.    Clinical Impression   PTA, pt lived alone and was independent with ADL and mobility. Works as Statistician. Discussed D/C plan with pt and feel pt will be able to D/C home with intermittent S after shoulder dysfunction is addressed. Pt states she will be able to hire caregivers if needed. Therapy needs will be dependent on management of shoulder. Will plan to see again tomorrow to further assess needs for D/C.     Follow Up Recommendations  Supervision - Intermittent;Home health OT    Equipment Recommendations  None recommended by OT    Recommendations for Other Services       Precautions / Restrictions Precautions Precautions: None Restrictions Weight Bearing Restrictions: Yes RUE Weight Bearing: Non weight bearing      Mobility Bed Mobility Overal bed mobility: Modified Independent                Transfers Overall transfer level: Needs assistance   Transfers: Sit to/from Stand;Stand Pivot Transfers Sit to Stand: Supervision Stand pivot transfers: Supervision       General transfer comment: S due to IV line; first time out of bed    Balance Overall balance assessment: No apparent balance deficits (not formally assessed)                                          ADL Overall ADL's : Needs assistance/impaired Eating/Feeding: Set up   Grooming: Set up;Standing   Upper Body Bathing: Moderate assistance;Sitting   Lower Body Bathing: Moderate assistance;Sit to/from stand   Upper Body Dressing : Maximal assistance;Sitting   Lower Body Dressing: Minimal  assistance;Sit to/from stand   Toilet Transfer: Ambulation;Comfort height toilet;Supervision/safety   Toileting- Clothing Manipulation and Hygiene: Minimal assistance;Sit to/from stand       Functional mobility during ADLs: Supervision/safety General ADL Comments: ADL limited by pain and limited ROM R shoulder at this time. Kept in sling due to shoulder injury.     Vision                     Perception     Praxis      Pertinent Vitals/Pain Pain Assessment: 0-10 Pain Score: 6  Pain Location: R shoulder Pain Descriptors / Indicators: Aching;Constant Pain Intervention(s): Limited activity within patient's tolerance;Monitored during session;Repositioned;Ice applied     Hand Dominance Right   Extremity/Trunk Assessment Upper Extremity Assessment Upper Extremity Assessment: RUE deficits/detail RUE Deficits / Details: RUE kept in slinged position with shoulder and wais strap RUE Coordination: decreased fine motor;decreased gross motor   Lower Extremity Assessment Lower Extremity Assessment: Overall WFL for tasks assessed   Cervical / Trunk Assessment Cervical / Trunk Assessment: Normal   Communication Communication Communication: No difficulties   Cognition Arousal/Alertness: Awake/alert Behavior During Therapy: WFL for tasks assessed/performed Overall Cognitive Status: Within Functional Limits for tasks assessed                     General Comments       Exercises       Shoulder  Instructions      Home Living Family/patient expects to be discharged to:: Private residence Living Arrangements: Alone Available Help at Discharge: Family;Available PRN/intermittently Type of Home: House Home Access: Stairs to enter Entrance Stairs-Number of Steps: 4-5   Home Layout: Two level;Able to live on main level with bedroom/bathroom Alternate Level Stairs-Number of Steps: flight   Bathroom Shower/Tub: Producer, television/film/video: Standard Bathroom  Accessibility: Yes How Accessible: Accessible via walker Home Equipment: Hand held shower head          Prior Functioning/Environment Level of Independence: Independent        Comments: works as Metallurgist Diagnosis: Acute pain;Generalized weakness (RUE)   OT Problem List: Decreased strength;Decreased range of motion;Decreased coordination;Decreased knowledge of precautions;Impaired UE functional use;Pain   OT Treatment/Interventions: Self-care/ADL training;Therapeutic exercise;DME and/or AE instruction;Therapeutic activities;Patient/family education    OT Goals(Current goals can be found in the care plan section) Acute Rehab OT Goals Patient Stated Goal: to have my shoulder fixed OT Goal Formulation: With patient Time For Goal Achievement: 09/21/14 Potential to Achieve Goals: Good  OT Frequency: Min 3X/week   Barriers to D/C:            Co-evaluation              End of Session Nurse Communication: Mobility status;Patient requests pain meds;Precautions;Weight bearing status  Activity Tolerance: Patient tolerated treatment well Patient left: in chair;with call bell/phone within reach;with family/visitor present   Time: 0825-0850 OT Time Calculation (min): 25 min Charges:  OT General Charges $OT Visit: 1 Procedure OT Evaluation $Initial OT Evaluation Tier I: 1 Procedure OT Treatments $Self Care/Home Management : 8-22 mins G-Codes:    Elsi Stelzer,HILLARY 09/29/14, 9:18 AM   Luisa Dago, OTR/L  [phone] 09-29-2014

## 2014-09-07 NOTE — Progress Notes (Signed)
Discharge instructions given. Pt verbalized understanding and all questions were answered.  

## 2014-09-07 NOTE — ED Notes (Signed)
Radiology, Allie, notified for portable XR.

## 2014-09-07 NOTE — H&P (Signed)
Kayla Case is an 69 y.o. female.   Chief Complaint: Anterior inferior right shoulder dislocation HPI: Patient is a 69 year old woman who had a dislocation of her right shoulder last night. 3 attempted reductions were performed at [facility] urgent care and reductions were unsuccessful. Patient presents at this time for closed reduction right shoulder. Patient denies any previous instability of her shoulder. She is status post debridement of left shoulder for rotator cuff pathology.  Past Medical History  Diagnosis Date  . Diabetes mellitus without complication   . Rheumatoid arthritis   . Hyperlipidemia   . Anxiety   . Depression   . Thyroid disease     Past Surgical History  Procedure Laterality Date  . Abdominal hysterectomy      History reviewed. No pertinent family history. Social History:  reports that she has never smoked. She does not have any smokeless tobacco history on file. She reports that she does not drink alcohol or use illicit drugs.  Allergies:  Allergies  Allergen Reactions  . Penicillins   . Sulfa Antibiotics     Medications Prior to Admission  Medication Sig Dispense Refill  . aspirin 81 MG tablet Take 81 mg by mouth daily.    . cephALEXin (KEFLEX) 500 MG capsule Take 1 capsule (500 mg total) by mouth 4 (four) times daily. 40 capsule 0  . DULoxetine (CYMBALTA) 60 MG capsule Take 60 mg by mouth daily.    Marland Kitchen levothyroxine (SYNTHROID, LEVOTHROID) 25 MCG tablet Take 25 mcg by mouth daily before breakfast.    . LORazepam (ATIVAN) 1 MG tablet Take 1 mg by mouth every 6 (six) hours as needed for anxiety.    . Lorcaserin HCl (BELVIQ) 10 MG TABS Take by mouth daily.    Marland Kitchen losartan-hydrochlorothiazide (HYZAAR) 100-25 MG per tablet Take 1 tablet by mouth daily.    . metoprolol succinate (TOPROL-XL) 50 MG 24 hr tablet Take 50 mg by mouth daily. Take with or immediately following a meal.    . simvastatin (ZOCOR) 20 MG tablet Take 20 mg by mouth daily.    Marland Kitchen tobramycin  (TOBREX) 0.3 % ophthalmic solution Place 2 drops into the left eye every 4 (four) hours. 5 mL 0    Results for orders placed or performed during the hospital encounter of 09/06/14 (from the past 48 hour(s))  CBC with Differential     Status: Abnormal   Collection Time: 09/06/14 10:55 PM  Result Value Ref Range   WBC 9.5 4.0 - 10.5 K/uL   RBC 3.95 3.87 - 5.11 MIL/uL   Hemoglobin 11.6 (L) 12.0 - 15.0 g/dL   HCT 34.2 (L) 36.0 - 46.0 %   MCV 86.6 78.0 - 100.0 fL   MCH 29.4 26.0 - 34.0 pg   MCHC 33.9 30.0 - 36.0 g/dL   RDW 14.0 11.5 - 15.5 %   Platelets 212 150 - 400 K/uL   Neutrophils Relative % 80 (H) 43 - 77 %   Neutro Abs 7.5 1.7 - 7.7 K/uL   Lymphocytes Relative 14 12 - 46 %   Lymphs Abs 1.3 0.7 - 4.0 K/uL   Monocytes Relative 5 3 - 12 %   Monocytes Absolute 0.5 0.1 - 1.0 K/uL   Eosinophils Relative 1 0 - 5 %   Eosinophils Absolute 0.1 0.0 - 0.7 K/uL   Basophils Relative 0 0 - 1 %   Basophils Absolute 0.0 0.0 - 0.1 K/uL  Basic metabolic panel     Status: Abnormal   Collection  Time: 09/06/14 10:55 PM  Result Value Ref Range   Sodium 140 135 - 145 mmol/L    Comment: Please note change in reference range.   Potassium 3.5 3.5 - 5.1 mmol/L    Comment: Please note change in reference range.   Chloride 102 96 - 112 mEq/L   CO2 30 19 - 32 mmol/L   Glucose, Bld 349 (H) 70 - 99 mg/dL   BUN 28 (H) 6 - 23 mg/dL   Creatinine, Ser 1.26 (H) 0.50 - 1.10 mg/dL   Calcium 9.1 8.4 - 10.5 mg/dL   GFR calc non Af Amer 43 (L) >90 mL/min   GFR calc Af Amer 50 (L) >90 mL/min    Comment: (NOTE) The eGFR has been calculated using the CKD EPI equation. This calculation has not been validated in all clinical situations. eGFR's persistently <90 mL/min signify possible Chronic Kidney Disease.    Anion gap 8 5 - 15  Glucose, capillary     Status: Abnormal   Collection Time: 09/07/14  3:09 AM  Result Value Ref Range   Glucose-Capillary 153 (H) 70 - 99 mg/dL   Comment 1 Notify RN   Surgical pcr  screen     Status: None   Collection Time: 09/07/14  4:14 AM  Result Value Ref Range   MRSA, PCR NEGATIVE NEGATIVE   Staphylococcus aureus NEGATIVE NEGATIVE    Comment:        The Xpert SA Assay (FDA approved for NASAL specimens in patients over 29 years of age), is one component of a comprehensive surveillance program.  Test performance has been validated by [employer] for patients greater than or equal to 47 year old. It is not intended to diagnose infection nor to guide or monitor treatment.   Glucose, capillary     Status: Abnormal   Collection Time: 09/07/14  6:32 AM  Result Value Ref Range   Glucose-Capillary 172 (H) 70 - 99 mg/dL   Comment 1 Notify RN   Glucose, capillary     Status: Abnormal   Collection Time: 09/07/14 11:16 AM  Result Value Ref Range   Glucose-Capillary 168 (H) 70 - 99 mg/dL   Ct Shoulder Right Wo Contrast  09/07/2014   CLINICAL DATA:  Persistent right humeral head dislocation despite attempted reductions. Further assessment requested. Initial encounter.  EXAM: CT OF THE RIGHT SHOULDER WITHOUT CONTRAST  TECHNIQUE: Multidetector CT imaging was performed according to the standard protocol. Multiplanar CT image reconstructions were also generated.  COMPARISON:  Right shoulder radiograph performed earlier today at 12:58 a.m.  FINDINGS: There is inferior dislocation of the right humeral head, with the humeral head medially displaced and lodged against the inferior glenoid. No definite Hill-Sachs lesion is seen, though there appears to be a small osseous Bankart lesion. An additional osseous fragment near the superior glenoid may reflect a small superior glenoid rim fracture.  A few additional loose bodies are seen about the humeral head. There is prominent subcortical cystic change at the expected location of the distal insertion of the rotator cuff, with a subcortical cyst abutting the inferior glenoid rim.  Blood and fluid are seen filling the right glenohumeral  joint space. The rotator cuff is not well assessed. The biceps tendon is not well characterized.  Mild soft tissue injury is noted at the upper right axilla. The vasculature is not well assessed without contrast.  The visualized portions of the mediastinum are grossly unremarkable. There is a mildly mosaic pattern of parenchymal attenuation within  the lungs. No rib fractures are seen.  IMPRESSION: 1. Inferior dislocation of the right humeral head, with the humeral head medially displaced and lodged against the inferior glenoid. No Hill-Sachs lesion seen, though there appears to be a small osseous Bankart lesion. 2. Additional osseous fragment near the superior glenoid may reflect a small superior glenoid rim fracture, of indeterminate age. 3. Prominent subcortical cystic change at the expected location of the distal insertion of the rotator cuff, with a subcortical cyst abutting the inferior glenoid rim. 4. Blood and fluid seen filling the right glenohumeral joint space. Rotator cuff and biceps tendon not well assessed. 5. Mild soft tissue injury noted at the upper right axilla. 6. Few loose bodies noted about the humeral head, reflecting degenerative change. 7. Mildly mosaic pattern of parenchymal attenuation within the lungs.   Electronically Signed   By: Garald Balding M.D.   On: 09/07/2014 02:16   Dg Shoulder Right Port  09/07/2014   CLINICAL DATA:  Second attempt postreduction of dislocated right shoulder.  EXAM: PORTABLE RIGHT SHOULDER - 2+ VIEW  COMPARISON:  09/07/2014  FINDINGS: Single portable view right shoulder demonstrates persistent anterior dislocation of the right shoulder.  IMPRESSION: Persistent anterior dislocation of the right shoulder.   Electronically Signed   By: Lucienne Capers M.D.   On: 09/07/2014 01:17   Dg Shoulder Right Port  09/07/2014   CLINICAL DATA:  Portable shoulder postreduction of right shoulder dislocation.  EXAM: PORTABLE RIGHT SHOULDER - 2+ VIEW  COMPARISON:  09/06/2014   FINDINGS: Single view right shoulder demonstrates persistent anterior dislocation of the right shoulder.  IMPRESSION: Persistent anterior dislocation of the right shoulder.   Electronically Signed   By: Lucienne Capers M.D.   On: 09/07/2014 00:26   Dg Humerus Right  09/06/2014   CLINICAL DATA:  Fall, landing on shoulder. Complains of pain in the right shoulder and limited range of motion.  EXAM: RIGHT HUMERUS - 2+ VIEW  COMPARISON:  None.  FINDINGS: Anterior inferior dislocation of the right shoulder with Hill-Sachs impaction fracture along the lateral humeral head. Fragmentation of the inferior glenoid likely represents a Bankart fracture. Degenerative changes in the acromioclavicular joint with subacromial spurring.  IMPRESSION: Anterior dislocation of the right shoulder with Hill-Sachs and Bankart lesions suggested. Underlying degenerative changes.   Electronically Signed   By: Lucienne Capers M.D.   On: 09/06/2014 22:54    Review of Systems  All other systems reviewed and are negative.   Blood pressure 158/76, pulse 77, temperature 98.4 F (36.9 C), temperature source Oral, resp. rate 17, height 5' 1"  (1.549 m), weight 74.844 kg (165 lb), SpO2 99 %. Physical Exam  On examination patient's right upper extremity is neurovascularly intact. CT scan shows an inferior dislocation of the right shoulder. There is a Hill-Sachs lesion. Do not see any deficit to the glenoid. Assessment/Plan Assessment: Anterior inferior right shoulder dislocation with Hill-Sachs deformity.  Plan: We'll plan for a general anesthetic with closed reduction. Anticipate discharge to home after reduction.  Jiro Kiester V 09/07/2014, 11:39 AM

## 2014-09-07 NOTE — Op Note (Signed)
09/06/2014 - 09/07/2014  5:03 PM  PATIENT:  Kayla Case    PRE-OPERATIVE DIAGNOSIS:  Right Shoulder Dislocation  POST-OPERATIVE DIAGNOSIS:  Same  PROCEDURE:  CLOSED REDUCTION SHOULDER  SURGEON:  Nadara Mustard, MD  PHYSICIAN ASSISTANT:None ANESTHESIA:   General  PREOPERATIVE INDICATIONS:  Kayla Case is a  69 y.o. female with a diagnosis of Right Shoulder Dislocation who failed conservative measures and elected for surgical management.    The risks benefits and alternatives were discussed with the patient preoperatively including but not limited to the risks of infection, bleeding, nerve injury, cardiopulmonary complications, the need for revision surgery, among others, and the patient was willing to proceed.  OPERATIVE IMPLANTS: None  OPERATIVE FINDINGS: C-arm fluoroscopy verified reduction of right shoulder  OPERATIVE PROCEDURE: Patient was brought to the operating room and underwent a general anesthetic. After adequate levels and anesthesia were obtained a timeout was called. Closed reduction was performed and the shoulder was reduced. Patient had good range of motion shoulder after reduction with both internal and external rotation abduction and flexion and extension. C-arm fluoroscopy verified reduction. Patient was placed back in her sling extubated taken to the PACU in stable condition.

## 2014-09-07 NOTE — Progress Notes (Signed)
ORTHO  I have discussed the case with Dr. Nicanor Alcon.  Patient with multiple medical problems including long standing RA and bilateral shoulder instability.   Right shoulder anterior dislocation tonight that underwent three attempts at, and two actual reductions of, the right shoulder in the ED.  In the two reduced cases both MD and patient reported success but as shoulder was brought out of traction and down to her side for placement into sling immobilizer, recurrent dislocation occurred.    NV examination remains without deficit per EDP.  I have ordered a CT scan to evaluate degree of Hill Sachs deformity and possible glenoid dysplasia or fracture.  She will remain NPO and will be transferred [facility] for further management.  Subsequent plans will be based upon findings but could include closed reduction and a gunslinger/ airplane splint or operative treatment.  I will likely refer to one of my colleagues with shoulder expertise.  Myrene Galas, MD [facility] [phone] (p)

## 2014-09-07 NOTE — ED Notes (Signed)
Report provided to [facility]  

## 2014-09-07 NOTE — Transfer of Care (Signed)
Immediate Anesthesia Transfer of Care Note  Patient: Kayla Case  Procedure(s) Performed: Procedure(s): CLOSED REDUCTION SHOULDER (Right)  Patient Location: PACU  Anesthesia Type:General  Level of Consciousness: awake, alert  and oriented  Airway & Oxygen Therapy: Patient Spontanous Breathing  Post-op Assessment: Report given to PACU RN and Post -op Vital signs reviewed and stable  Post vital signs: Reviewed and stable  Complications: No apparent anesthesia complications

## 2014-09-07 NOTE — Anesthesia Preprocedure Evaluation (Addendum)
Anesthesia Evaluation  Patient identified by MRN, date of birth, ID band Patient awake    Reviewed: Allergy & Precautions, H&P , NPO status   History of Anesthesia Complications Negative for: history of anesthetic complications  Airway Mallampati: II   Neck ROM: Full    Dental  (+) Teeth Intact, Dental Advisory Given   Pulmonary neg pulmonary ROS,  breath sounds clear to auscultation        Cardiovascular negative cardio ROS  Rhythm:Regular Rate:Normal     Neuro/Psych    GI/Hepatic   Endo/Other  diabetes  Renal/GU      Musculoskeletal  (+) Arthritis -, Rheumatoid disorders,    Abdominal   Peds  Hematology   Anesthesia Other Findings   Reproductive/Obstetrics                           Anesthesia Physical Anesthesia Plan  ASA: II  Anesthesia Plan: General   Post-op Pain Management:    Induction: Intravenous  Airway Management Planned: LMA  Additional Equipment:   Intra-op Plan:   Post-operative Plan: Extubation in OR  Informed Consent: I have reviewed the patients History and Physical, chart, labs and discussed the procedure including the risks, benefits and alternatives for the proposed anesthesia with the patient or authorized representative who has indicated his/her understanding and acceptance.   Dental advisory given  Plan Discussed with: CRNA, Surgeon and Anesthesiologist  Anesthesia Plan Comments:        Anesthesia Quick Evaluation

## 2014-09-07 NOTE — Progress Notes (Signed)
PT Cancellation Note  Patient Details Name: Kayla Case MRN: [id number] DOB: 21-Dec-1945   Cancelled Treatment:    Reason Eval/Treat Not Completed: PT screened, no needs identified, will sign off. Per OT patient indep with mobility. Pt with no skilled PT needs at this time. PT signing off.   Marcene Brawn 09/07/2014, 11:51 AM  Lewis Shock, PT, DPT Pager #: [phone] Office #: [phone]

## 2014-09-07 NOTE — Clinical Social Work Note (Signed)
CSW received consult for SNF. CSW spoke with PT Leatrice Jewels) who states pt has not skilled PT need at this time. CSW reviewed patient's chart and per OT patient lives alone and was independent with ADL and mobility prior to admission. CSW met with patient who states she is to follow-up with ortho in 2 weeks.  No further needs. CSW signing off.  [place], [place] Weekend Clinical Social Worker [phone]

## 2014-09-07 NOTE — Anesthesia Postprocedure Evaluation (Signed)
  Anesthesia Post-op Note  Patient: Kayla Case  Procedure(s) Performed: Procedure(s): CLOSED REDUCTION SHOULDER (Right)  Patient Location: PACU  Anesthesia Type:General  Level of Consciousness: awake and alert   Airway and Oxygen Therapy: Patient Spontanous Breathing  Post-op Pain: none  Post-op Assessment: Post-op Vital signs reviewed  Post-op Vital Signs: stable  Last Vitals:  Filed Vitals:   09/07/14 1748  BP: 141/49  Pulse: 79  Temp:   Resp: 21    Complications: No apparent anesthesia complications

## 2014-09-09 ENCOUNTER — Encounter (HOSPITAL_COMMUNITY): Payer: Self-pay | Admitting: Orthopedic Surgery

## 2014-09-23 ENCOUNTER — Ambulatory Visit: Payer: Medicare Other | Admitting: Rehabilitation

## 2014-09-30 ENCOUNTER — Ambulatory Visit: Payer: Medicare Other | Attending: Orthopedic Surgery | Admitting: Rehabilitation

## 2014-09-30 DIAGNOSIS — M25611 Stiffness of right shoulder, not elsewhere classified: Secondary | ICD-10-CM | POA: Diagnosis not present

## 2014-09-30 DIAGNOSIS — Z9889 Other specified postprocedural states: Secondary | ICD-10-CM | POA: Diagnosis not present

## 2014-09-30 DIAGNOSIS — M25511 Pain in right shoulder: Secondary | ICD-10-CM | POA: Diagnosis not present

## 2014-10-03 ENCOUNTER — Ambulatory Visit: Payer: Medicare Other | Admitting: Rehabilitation

## 2014-10-03 DIAGNOSIS — M25511 Pain in right shoulder: Secondary | ICD-10-CM | POA: Diagnosis not present

## 2014-10-07 ENCOUNTER — Ambulatory Visit: Payer: Medicare Other | Attending: Orthopedic Surgery | Admitting: Rehabilitation

## 2014-10-07 DIAGNOSIS — M069 Rheumatoid arthritis, unspecified: Secondary | ICD-10-CM | POA: Diagnosis not present

## 2014-10-07 DIAGNOSIS — E079 Disorder of thyroid, unspecified: Secondary | ICD-10-CM | POA: Diagnosis not present

## 2014-10-07 DIAGNOSIS — X58XXXD Exposure to other specified factors, subsequent encounter: Secondary | ICD-10-CM | POA: Insufficient documentation

## 2014-10-07 DIAGNOSIS — S43004D Unspecified dislocation of right shoulder joint, subsequent encounter: Secondary | ICD-10-CM | POA: Diagnosis present

## 2014-10-07 DIAGNOSIS — E119 Type 2 diabetes mellitus without complications: Secondary | ICD-10-CM | POA: Diagnosis not present

## 2014-10-07 DIAGNOSIS — E785 Hyperlipidemia, unspecified: Secondary | ICD-10-CM | POA: Diagnosis not present

## 2014-10-07 DIAGNOSIS — Z9889 Other specified postprocedural states: Secondary | ICD-10-CM | POA: Insufficient documentation

## 2014-10-10 ENCOUNTER — Ambulatory Visit: Payer: Medicare Other | Admitting: Rehabilitation

## 2014-10-10 DIAGNOSIS — S43004D Unspecified dislocation of right shoulder joint, subsequent encounter: Secondary | ICD-10-CM | POA: Diagnosis not present

## 2014-10-14 ENCOUNTER — Ambulatory Visit: Payer: Medicare Other | Admitting: Rehabilitation

## 2014-10-14 DIAGNOSIS — S43004D Unspecified dislocation of right shoulder joint, subsequent encounter: Secondary | ICD-10-CM | POA: Diagnosis not present

## 2014-10-17 ENCOUNTER — Ambulatory Visit: Payer: Medicare Other | Admitting: Rehabilitation

## 2014-10-17 DIAGNOSIS — S43004D Unspecified dislocation of right shoulder joint, subsequent encounter: Secondary | ICD-10-CM | POA: Diagnosis not present

## 2014-10-21 ENCOUNTER — Ambulatory Visit: Payer: Medicare Other | Admitting: Rehabilitation

## 2014-10-21 DIAGNOSIS — S43004D Unspecified dislocation of right shoulder joint, subsequent encounter: Secondary | ICD-10-CM | POA: Diagnosis not present

## 2014-10-24 ENCOUNTER — Encounter: Payer: Self-pay | Admitting: Rehabilitation

## 2014-10-24 ENCOUNTER — Ambulatory Visit: Payer: Medicare Other | Admitting: Rehabilitation

## 2014-10-24 DIAGNOSIS — S43004D Unspecified dislocation of right shoulder joint, subsequent encounter: Secondary | ICD-10-CM | POA: Diagnosis not present

## 2014-10-24 DIAGNOSIS — M25611 Stiffness of right shoulder, not elsewhere classified: Secondary | ICD-10-CM

## 2014-10-24 DIAGNOSIS — M25511 Pain in right shoulder: Secondary | ICD-10-CM

## 2014-10-24 NOTE — Therapy (Signed)
[facility] Outpatient Rehabilitation [facility] [address]  [address] [place], [place], [address] Phone: [phone]   Fax:  [phone]  Physical Therapy Treatment  Patient Details  Name: Kayla Case MRN: [id number] Date of Birth: February 21, 1946 Referring Provider:  Nadara Mustard, MD  Encounter Date: 10/24/2014      PT End of Session - 10/24/14 1401    Visit Number 8   Number of Visits 12   Date for PT Re-Evaluation 11/11/14   PT Start Time 1315   PT Stop Time 1404   PT Time Calculation (min) 49 min   Activity Tolerance Patient tolerated treatment well      Past Medical History  Diagnosis Date  . Diabetes mellitus without complication   . Rheumatoid arthritis   . Hyperlipidemia   . Anxiety   . Depression   . Thyroid disease     Past Surgical History  Procedure Laterality Date  . Abdominal hysterectomy    . Shoulder closed reduction Right 09/07/2014    Procedure: CLOSED REDUCTION SHOULDER;  Surgeon: Nadara Mustard, MD;  [facility];  Service: Orthopedics;  Laterality: Right;    There were no vitals taken for this visit.  Visit Diagnosis:  Right shoulder pain  Shoulder joint stiffness, right      Subjective Assessment - 10/24/14 1317    Symptoms no significant complaints today. denies pain today   Currently in Pain? No/denies          [facility] PT Assessment - 10/24/14 0001    Assessment   Medical Diagnosis R shoulder dislocation   Onset Date 09/06/14   Next MD Visit none   Prior Therapy no   Precautions   Precautions Other (comment)  no extreme ER   Balance Screen   Has the patient fallen in the past 6 months No   Prior Function   Level of Independence Independent with basic ADLs   Observation/Other Assessments   Focus on Therapeutic Outcomes (FOTO)  59%   AROM   Right Shoulder Flexion 100 Degrees  pn   Right Shoulder ABduction 90 Degrees  pn   Right Shoulder Internal Rotation --  to glute pn   Right Shoulder External  Rotation --  90% pn   PROM   Right Shoulder Flexion 140 Degrees   Right Shoulder ABduction 150 Degrees   Right Shoulder External Rotation 55 Degrees                  [facility] Adult PT Treatment/Exercise - 10/24/14 0001    Exercises   Exercises Shoulder   Shoulder Exercises: Supine   Flexion 10 reps;AAROM   Shoulder Exercises: Sidelying   External Rotation AROM;10 reps  x2 sets   ABduction AROM;10 reps  x2 sets   Shoulder Exercises: ROM/Strengthening   UBE (Upper Arm Bike) level 2.5 x forward   Shoulder Exercises: Isometric Strengthening   Flexion 5X10"   External Rotation 5X10"   Internal Rotation 5X10"   ABduction 5X10"   ADduction 5X10"   Modalities   Modalities --  vasopneumatic compression R shoulder x   Manual Therapy   Manual Therapy Passive ROM   Passive ROM to the R shoulder all direction to tolerance                     PT Long Term Goals - 10/24/14 1406    PT LONG TERM GOAL #1   Title be independent with HEP 11/11/14  Time 6   Period Weeks   Status On-going   PT LONG TERM GOAL #2   Title improve shoulder AROM to [facility] to carry out ADL   Time 6   Period Weeks   Status Achieved   PT LONG TERM GOAL #3   Title demonstrate MMT at the shoulder of 4/5 or greater  11/11/14   Time 6   Period Weeks   Status On-going   PT LONG TERM GOAL #4   Title don bra without increased pain  11/11/14   Time 6   Period Weeks   Status On-going   PT LONG TERM GOAL #5   Title return to driving without limitations   Time 6   Period Weeks   Status Achieved               Plan - 10/24/14 1402    Clinical Impression Statement Pt demonstrates improving shoulder active and passive ROM and demonstrated by measurements today.  Continues to have decreased tolerance to therapeutic exericse due to low tolerance to pain   Pt will benefit from skilled therapeutic intervention in order to improve on the following deficits Decreased range of  motion;Decreased strength   Rehab Potential Excellent   Clinical Impairments Affecting Rehab Potential none        Problem List Patient Active Problem List   Diagnosis Date Noted  . Anterior shoulder dislocation 09/07/2014  . Hyperlipidemia     Idamae Lusher, DPT, CMP 10/24/2014, 2:11 PM  [facility] [address]  [address] [place], [place], [address] Phone: [phone]   Fax:  [phone]

## 2014-10-28 ENCOUNTER — Ambulatory Visit: Payer: Medicare Other | Admitting: Rehabilitation

## 2014-10-28 DIAGNOSIS — S43004D Unspecified dislocation of right shoulder joint, subsequent encounter: Secondary | ICD-10-CM | POA: Diagnosis not present

## 2014-10-28 DIAGNOSIS — M25511 Pain in right shoulder: Secondary | ICD-10-CM

## 2014-10-28 DIAGNOSIS — M25611 Stiffness of right shoulder, not elsewhere classified: Secondary | ICD-10-CM

## 2014-10-28 NOTE — Therapy (Signed)
[facility] Outpatient Rehabilitation [facility] [address]  [address] [place], [place], [address] Phone: [phone]   Fax:  [phone]  Physical Therapy Treatment  Patient Details  Name: Kayla Case MRN: [id number] Date of Birth: 1946-08-09 Referring Provider:  Nadara Mustard, MD  Encounter Date: 10/28/2014      PT End of Session - 10/28/14 1353    Visit Number 9   Number of Visits 12   Date for PT Re-Evaluation 11/11/14   PT Start Time 1317   PT Stop Time 1405   PT Time Calculation (min) 48 min   Activity Tolerance Patient tolerated treatment well   Behavior During Therapy [facility] for tasks assessed/performed      Past Medical History  Diagnosis Date  . Diabetes mellitus without complication   . Rheumatoid arthritis   . Hyperlipidemia   . Anxiety   . Depression   . Thyroid disease     Past Surgical History  Procedure Laterality Date  . Abdominal hysterectomy    . Shoulder closed reduction Right 09/07/2014    Procedure: CLOSED REDUCTION SHOULDER;  Surgeon: Nadara Mustard, MD;  Location: [facility] OR;  Service: Orthopedics;  Laterality: Right;    There were no vitals taken for this visit.  Visit Diagnosis:  Right shoulder pain  Shoulder joint stiffness, right      Subjective Assessment - 10/28/14 1320    Symptoms Reports needing to take a pain pill over the weekend. Not sure if the weather is affecting her pain but says it can also be from her rheumatoid arthiritis. Also stated that she cleaned out her garage over the weekend.    Currently in Pain? Yes   Pain Score 3    Pain Location Shoulder   Pain Orientation Right   Pain Descriptors / Indicators Heaviness                    [facility] Adult PT Treatment/Exercise - 10/28/14 0001    Shoulder Exercises: Supine   Flexion 10 reps;AAROM   Other Supine Exercises Circles at 90 flexion x 15 each cw/ccw, AROM   Shoulder Exercises: Sidelying   External Rotation AROM;10 reps  2 sets   ABduction AROM;10 reps  2 sets   Other Sidelying Exercises Horizontal Abduction x 10 AAROM   Shoulder Exercises: Standing   Other Standing Exercises wall push ups x 10   Shoulder Exercises: ROM/Strengthening   UBE (Upper Arm Bike) level 2.5 x forward, 2 minutes backward   Rhythmic Stabilization, Supine x60 seconds at 90 flexion   Shoulder Exercises: Isometric Strengthening   External Rotation 5X10"  manual resistance in supine   Internal Rotation 5X10"  manual resistance in supine   Modalities   Modalities --  Vasopneumatic compressions Rt shoulder x 15 minutes   Manual Therapy   Manual Therapy Passive ROM   Passive ROM to the R shoulder all direction to tolerance                     PT Long Term Goals - 10/24/14 1406    PT LONG TERM GOAL #1   Title be independent with HEP 11/11/14   Time 6   Period Weeks   Status On-going   PT LONG TERM GOAL #2   Title improve shoulder AROM to [facility] to carry out ADL   Time 6   Period Weeks   Status Achieved   PT LONG TERM GOAL #3   Title  demonstrate MMT at the shoulder of 4/5 or greater  11/11/14   Time 6   Period Weeks   Status On-going   PT LONG TERM GOAL #4   Title don bra without increased pain  11/11/14   Time 6   Period Weeks   Status On-going   PT LONG TERM GOAL #5   Title return to driving without limitations   Time 6   Period Weeks   Status Achieved               Plan - 10/28/14 1354    Clinical Impression Statement Patient continues to demonstrate improved motion in her shoulder and was able to hold more resistance against Rhythmic stab exercises. Tolerating new exercises well but continues to have decreased over al tolerance with therapy.    Pt will benefit from skilled therapeutic intervention in order to improve on the following deficits Decreased range of motion;Decreased strength   Rehab Potential Excellent        Problem List Patient Active Problem List   Diagnosis Date Noted  . Anterior  shoulder dislocation 09/07/2014  . Hyperlipidemia     Ronney Lion 10/28/2014, 2:08 PM  [facility] [address]  [address] [place], [place], [address] Phone: [phone]   Fax:  [phone]

## 2014-10-31 ENCOUNTER — Encounter: Payer: Self-pay | Admitting: Rehabilitation

## 2014-10-31 ENCOUNTER — Ambulatory Visit: Payer: Medicare Other | Admitting: Rehabilitation

## 2014-10-31 DIAGNOSIS — M25611 Stiffness of right shoulder, not elsewhere classified: Secondary | ICD-10-CM

## 2014-10-31 DIAGNOSIS — S43004D Unspecified dislocation of right shoulder joint, subsequent encounter: Secondary | ICD-10-CM | POA: Diagnosis not present

## 2014-10-31 DIAGNOSIS — M25511 Pain in right shoulder: Secondary | ICD-10-CM

## 2014-10-31 NOTE — Therapy (Signed)
[facility] Outpatient Rehabilitation [facility] [address]  [address] [place], [place], [address] Phone: [phone]   Fax:  [phone]  Physical Therapy Treatment  Patient Details  Name: Monnie Gudgel MRN: [id number] Date of Birth: 08/14/1946 Referring Provider:  Nadara Mustard, MD  Encounter Date: 10/31/2014      PT End of Session - 10/31/14 1357    Visit Number 10   Number of Visits 12   Date for PT Re-Evaluation 11/11/14   PT Start Time 1315   PT Stop Time 1358   PT Time Calculation (min) 43 min   Activity Tolerance Patient tolerated treatment well      Past Medical History  Diagnosis Date  . Diabetes mellitus without complication   . Rheumatoid arthritis   . Hyperlipidemia   . Anxiety   . Depression   . Thyroid disease     Past Surgical History  Procedure Laterality Date  . Abdominal hysterectomy    . Shoulder closed reduction Right 09/07/2014    Procedure: CLOSED REDUCTION SHOULDER;  Surgeon: Nadara Mustard, MD;  Location: [facility] OR;  Service: Orthopedics;  Laterality: Right;    There were no vitals taken for this visit.  Visit Diagnosis:  Right shoulder pain  Shoulder joint stiffness, right      Subjective Assessment - 10/31/14 1337    Symptoms reports no problems with ADLs but does continue to have pain.  wants to finish out plan of care.    Currently in Pain? Yes   Pain Score 3    Pain Location Shoulder   Pain Orientation Right   Aggravating Factors  use   Pain Relieving Factors rest, medication          [facility] PT Assessment - 10/31/14 0001    ROM / Strength   AROM / PROM / Strength Strength   AROM   Right Shoulder Flexion 115 Degrees  pain   Right Shoulder ABduction 120 Degrees  pain   Right Shoulder Internal Rotation --  to L5 with pain   Right Shoulder External Rotation --  90% with pain   Strength   Overall Strength Comments all with pain   Strength Assessment Site Shoulder   Right/Left Shoulder Right   Right  Shoulder Flexion 3+/5   Right Shoulder ABduction 3+/5   Right Shoulder Internal Rotation 4-/5   Right Shoulder External Rotation 4/5                  [facility] Adult PT Treatment/Exercise - 10/31/14 0001    Shoulder Exercises: Standing   External Rotation 10 reps  2 sets   Theraband Level (Shoulder External Rotation) Level 1 (Yellow)   Internal Rotation 10 reps  2 sets   Theraband Level (Shoulder Internal Rotation) Level 1 (Yellow)   Flexion 10 reps  2 sets   Theraband Level (Shoulder Flexion) Level 1 (Yellow)   Extension 10 reps  2 sets   Theraband Level (Shoulder Extension) Level 1 (Yellow)   Row 10 reps  2 sets   Theraband Level (Shoulder Row) Level 1 (Yellow)   Shoulder Exercises: Pulleys   Flexion 1 minute   ABduction 1 minute   Shoulder Exercises: Therapy Ball   Flexion 10 reps  2 sets   Other Therapy Ball Exercises circles orange ball, 2x20   Manual Therapy   Passive ROM R shoulder all directions to tolerance                PT  Education - 10/31/14 1339    Education provided No   Person(s) Educated --   Methods --   Comprehension --             PT Long Term Goals - 10/24/14 1406    PT LONG TERM GOAL #1   Title be independent with HEP 11/11/14   Time 6   Period Weeks   Status On-going   PT LONG TERM GOAL #2   Title improve shoulder AROM to [facility] to carry out ADL   Time 6   Period Weeks   Status Achieved   PT LONG TERM GOAL #3   Title demonstrate MMT at the shoulder of 4/5 or greater  11/11/14   Time 6   Period Weeks   Status On-going   PT LONG TERM GOAL #4   Title don bra without increased pain  11/11/14   Time 6   Period Weeks   Status On-going   PT LONG TERM GOAL #5   Title return to driving without limitations   Time 6   Period Weeks   Status Achieved               Plan - 10/31/14 1358    Clinical Impression Statement Pt progressing well   PT Next Visit Plan GCODE*, FOTO, update HEP with 4way shoulder handout and red  and yellow bands        Problem List Patient Active Problem List   Diagnosis Date Noted  . Anterior shoulder dislocation 09/07/2014  . Hyperlipidemia     Idamae Lusher, DPT, CMP 10/31/2014, 2:00 PM  [facility] [address]  [address] [place], [place], [address] Phone: [phone]   Fax:  [phone]

## 2014-11-04 ENCOUNTER — Ambulatory Visit: Payer: Medicare Other | Admitting: Rehabilitation

## 2014-11-04 DIAGNOSIS — M25611 Stiffness of right shoulder, not elsewhere classified: Secondary | ICD-10-CM

## 2014-11-04 DIAGNOSIS — M25511 Pain in right shoulder: Secondary | ICD-10-CM

## 2014-11-04 DIAGNOSIS — S43004D Unspecified dislocation of right shoulder joint, subsequent encounter: Secondary | ICD-10-CM | POA: Diagnosis not present

## 2014-11-04 NOTE — Therapy (Signed)
[facility] [address]  [place], [place], [address] Phone: [phone]   Fax:  [phone]  Physical Therapy Treatment  Patient Details  Name: Kayla Case MRN: [id number] Date of Birth: 10-02-1945 Referring Provider:  Newt Minion, MD  Encounter Date: 11/04/2014      PT End of Session - 11/04/14 1355    Visit Number 11   Number of Visits 12   Date for PT Re-Evaluation 11/11/14   PT Start Time 6222   PT Stop Time 1355   PT Time Calculation (min) 38 min   Activity Tolerance Patient tolerated treatment well   Behavior During Therapy [facility] for tasks assessed/performed      Past Medical History  Diagnosis Date  . Diabetes mellitus without complication   . Rheumatoid arthritis   . Hyperlipidemia   . Anxiety   . Depression   . Thyroid disease     Past Surgical History  Procedure Laterality Date  . Abdominal hysterectomy    . Shoulder closed reduction Right 09/07/2014    Procedure: CLOSED REDUCTION SHOULDER;  Surgeon: Newt Minion, MD;  [facility];  Service: Orthopedics;  Laterality: Right;    There were no vitals taken for this visit.  Visit Diagnosis:  Right shoulder pain  Shoulder joint stiffness, right      Subjective Assessment - 11/04/14 1323    Symptoms Reports still having pain but isn't sure if its the rheumatiod arthritis or her shoulder. Started some new medication, unsure of names per patient, for her rheumatoid.    Currently in Pain? Yes   Pain Score 1    Pain Location Shoulder   Pain Orientation Right                    [facility] Adult PT Treatment/Exercise - 11/04/14 1325    Shoulder Exercises: Standing   External Rotation 10 reps   Theraband Level (Shoulder External Rotation) Level 1 (Yellow);Level 2 (Red)   Internal Rotation 10 reps   Theraband Level (Shoulder Internal Rotation) Level 2 (Red)   Flexion 10 reps   Theraband Level (Shoulder Flexion) Level 2 (Red)    Extension 10 reps   Theraband Level (Shoulder Extension) Level 2 (Red)   Row 10 reps   Theraband Level (Shoulder Row) Level 2 (Red)   Shoulder Exercises: Pulleys   Flexion 1 minute  Seated   ABduction 1 minute  Seated   Shoulder Exercises: Therapy Ball   Flexion 10 reps  flexion up wall   Other Therapy Ball Exercises circles orange ball, x20 flexion, x10 abduction   Other Therapy Ball Exercises push ups on wall with orange pball x 10   Shoulder Exercises: ROM/Strengthening   UBE (Upper Arm Bike) level 2.5 x 2 minutes forward, 2 minutes backward   Rhythmic Stabilization, Supine x60 seconds at 90 flexion   Manual Therapy   Manual Therapy Passive ROM   Passive ROM R shoulder all directions to tolerance                PT Education - 11/04/14 1333    Education provided Yes   Education Details 4 way shoulder with theraband, ext and row with red theraband   Person(s) Educated Patient   Methods Explanation;Demonstration;Handout   Comprehension Verbalized understanding;Returned demonstration             PT Long Term Goals - 10/24/14 1406    PT LONG TERM GOAL #1  Title be independent with HEP 11/11/14   Time 6   Period Weeks   Status On-going   PT LONG TERM GOAL #2   Title improve shoulder AROM to [facility] to carry out ADL   Time 6   Period Weeks   Status Achieved   PT LONG TERM GOAL #3   Title demonstrate MMT at the shoulder of 4/5 or greater  11/11/14   Time 6   Period Weeks   Status On-going   PT LONG TERM GOAL #4   Title don bra without increased pain  11/11/14   Time 6   Period Weeks   Status On-going   PT LONG TERM GOAL #5   Title return to driving without limitations   Time 6   Period Weeks   Status Achieved               Plan - 11/28/2014 1355    Clinical Impression Statement Progressing well, continue 1 more week per patient request   Rehab Potential Excellent   PT Next Visit Plan Continue with strengthening          G-Codes - 28-Nov-2014  1321    Functional Limitation Mobility: Walking and moving around   Mobility: Walking and Moving Around Current Status [phone]) At least 40 percent but less than 60 percent impaired, limited or restricted   Mobility: Walking and Moving Around Goal Status [phone]) At least 20 percent but less than 40 percent impaired, limited or restricted      Problem List Patient Active Problem List   Diagnosis Date Noted  . Anterior shoulder dislocation 09/07/2014  . Hyperlipidemia     Ronney Lion, PTA 11/28/14, 1:57 PM  [facility] [address]  [address] [place], [place], [address] Phone: [phone]   Fax:  [phone]

## 2014-11-04 NOTE — Therapy (Addendum)
[facility] [address]  [place], [place], [address] Phone: [phone]   Fax:  [phone]  Physical Therapy Treatment  Patient Details  Name: Kayla Case MRN: [id number] Date of Birth: 10-02-1945 Referring Provider:  Newt Minion, MD  Encounter Date: 11/04/2014      PT End of Session - 11/04/14 1355    Visit Number 11   Number of Visits 12   Date for PT Re-Evaluation 11/11/14   PT Start Time 6222   PT Stop Time 1355   PT Time Calculation (min) 38 min   Activity Tolerance Patient tolerated treatment well   Behavior During Therapy [facility] for tasks assessed/performed      Past Medical History  Diagnosis Date  . Diabetes mellitus without complication   . Rheumatoid arthritis   . Hyperlipidemia   . Anxiety   . Depression   . Thyroid disease     Past Surgical History  Procedure Laterality Date  . Abdominal hysterectomy    . Shoulder closed reduction Right 09/07/2014    Procedure: CLOSED REDUCTION SHOULDER;  Surgeon: Newt Minion, MD;  Location: [facility];  Service: Orthopedics;  Laterality: Right;    There were no vitals taken for this visit.  Visit Diagnosis:  Right shoulder pain  Shoulder joint stiffness, right      Subjective Assessment - 11/04/14 1323    Symptoms Reports still having pain but isn't sure if its the rheumatiod arthritis or her shoulder. Started some new medication, unsure of names per patient, for her rheumatoid.    Currently in Pain? Yes   Pain Score 1    Pain Location Shoulder   Pain Orientation Right                    [facility] Adult PT Treatment/Exercise - 11/04/14 1325    Shoulder Exercises: Standing   External Rotation 10 reps   Theraband Level (Shoulder External Rotation) Level 1 (Yellow);Level 2 (Red)   Internal Rotation 10 reps   Theraband Level (Shoulder Internal Rotation) Level 2 (Red)   Flexion 10 reps   Theraband Level (Shoulder Flexion) Level 2 (Red)    Extension 10 reps   Theraband Level (Shoulder Extension) Level 2 (Red)   Row 10 reps   Theraband Level (Shoulder Row) Level 2 (Red)   Shoulder Exercises: Pulleys   Flexion 1 minute  Seated   ABduction 1 minute  Seated   Shoulder Exercises: Therapy Ball   Flexion 10 reps  flexion up wall   Other Therapy Ball Exercises circles orange ball, x20 flexion, x10 abduction   Other Therapy Ball Exercises push ups on wall with orange pball x 10   Shoulder Exercises: ROM/Strengthening   UBE (Upper Arm Bike) level 2.5 x 2 minutes forward, 2 minutes backward   Rhythmic Stabilization, Supine x60 seconds at 90 flexion   Manual Therapy   Manual Therapy Passive ROM   Passive ROM R shoulder all directions to tolerance                PT Education - 11/04/14 1333    Education provided Yes   Education Details 4 way shoulder with theraband, ext and row with red theraband   Person(s) Educated Patient   Methods Explanation;Demonstration;Handout   Comprehension Verbalized understanding;Returned demonstration             PT Long Term Goals - 10/24/14 1406    PT LONG TERM GOAL #1  Title be independent with HEP 11/11/14   Time 6   Period Weeks   Status On-going   PT LONG TERM GOAL #2   Title improve shoulder AROM to [facility] to carry out ADL   Time 6   Period Weeks   Status Achieved   PT LONG TERM GOAL #3   Title demonstrate MMT at the shoulder of 4/5 or greater  11/11/14   Time 6   Period Weeks   Status On-going   PT LONG TERM GOAL #4   Title don bra without increased pain  11/11/14   Time 6   Period Weeks   Status On-going   PT LONG TERM GOAL #5   Title return to driving without limitations   Time 6   Period Weeks   Status Achieved               Plan - 11-22-2014 1355    Clinical Impression Statement Progressing well, continue 1 more week per patient request   Rehab Potential Excellent   PT Next Visit Plan Continue with strengthening          G-Codes - 22-Nov-2014  1321    Functional Limitation Mobility: Walking and moving around   Mobility: Walking and Moving Around Current Status [phone]) At least 40 percent but less than 60 percent impaired, limited or restricted   Mobility: Walking and Moving Around Goal Status [phone]) At least 20 percent but less than 40 percent impaired, limited or restricted    This Gcode should now reflect Discharge status instead of Current Status  Problem List Patient Active Problem List   Diagnosis Date Noted  . Anterior shoulder dislocation 09/07/2014  . Hyperlipidemia    [place], [place], PTA  Krizia Flight, Adrian Prince, DPT, CMP  (Gcode co-sign) 11-22-14, 2:52 PM  [facility] [address]  [place], [place], [address] Phone: [phone]   Fax:  [phone]   PHYSICAL THERAPY DISCHARGE SUMMARY  Visits from Start of Care: 11  Current functional level related to goals / functional outcomes: [name] reports that she is doing all that she needs to do at home regarding ADLs and recreational activities despite continued pain and decreased AROM.  Feels Independent with her HEP at this time to continue at home.   Goal Update:  Refer to LTGs from above:  LTGs 1,2,4,and 5 met LTG 3 progressing well    Remaining deficits: Decreased AROM and strength, pain   Education / Equipment: HEP Plan: Patient agrees to discharge.  Patient goals were partially met. Patient is being discharged due to being pleased with the current functional level.  ?????       Shan Levans, DPT, [facility] 11/11/2014

## 2014-11-07 ENCOUNTER — Ambulatory Visit: Payer: Medicare Other | Admitting: Rehabilitation

## 2015-10-14 DIAGNOSIS — R7612 Nonspecific reaction to cell mediated immunity measurement of gamma interferon antigen response without active tuberculosis: Secondary | ICD-10-CM | POA: Insufficient documentation

## 2015-10-14 DIAGNOSIS — I10 Essential (primary) hypertension: Secondary | ICD-10-CM | POA: Insufficient documentation

## 2016-06-10 DIAGNOSIS — G8929 Other chronic pain: Secondary | ICD-10-CM | POA: Insufficient documentation

## 2017-09-06 HISTORY — PX: SHOULDER SURGERY: SHX246

## 2017-12-29 ENCOUNTER — Ambulatory Visit
Admission: RE | Admit: 2017-12-29 | Discharge: 2017-12-29 | Disposition: A | Discharge status: Home / Self Care | Payer: Medicare Other | Source: Ambulatory Visit | Attending: Anesthesiology | Admitting: Anesthesiology

## 2017-12-29 ENCOUNTER — Other Ambulatory Visit: Payer: Self-pay | Admitting: Anesthesiology

## 2017-12-29 DIAGNOSIS — Z01811 Encounter for preprocedural respiratory examination: Secondary | ICD-10-CM

## 2018-01-18 ENCOUNTER — Encounter: Payer: Self-pay | Admitting: Physical Therapy

## 2018-01-18 ENCOUNTER — Ambulatory Visit: Payer: Medicare Other | Attending: Specialist | Admitting: Physical Therapy

## 2018-01-18 ENCOUNTER — Other Ambulatory Visit: Payer: Self-pay

## 2018-01-18 DIAGNOSIS — R29898 Other symptoms and signs involving the musculoskeletal system: Secondary | ICD-10-CM | POA: Insufficient documentation

## 2018-01-18 DIAGNOSIS — M25511 Pain in right shoulder: Secondary | ICD-10-CM | POA: Diagnosis present

## 2018-01-18 DIAGNOSIS — M6281 Muscle weakness (generalized): Secondary | ICD-10-CM | POA: Diagnosis present

## 2018-01-18 DIAGNOSIS — M25611 Stiffness of right shoulder, not elsewhere classified: Secondary | ICD-10-CM | POA: Insufficient documentation

## 2018-01-18 NOTE — Therapy (Signed)
[facility] Outpatient Rehabilitation [facility] [address]  [address] [place], [place], [address] Phone: [phone]   Fax:  [phone]  Physical Therapy Evaluation  Patient Details  Name: Kayla Case MRN: [id number] Date of Birth: Dec 06, 1945 Referring Provider: Jene Every   Encounter Date: 01/18/2018  PT End of Session - 01/18/18 1458    Visit Number  1    Number of Visits  17    Date for PT Re-Evaluation  03/15/18    Authorization Type  Medicare + Federal BCBS- VL 75    PT Start Time  1400    PT Stop Time  1450    PT Time Calculation (min)  50 min    Activity Tolerance  Patient tolerated treatment well    Behavior During Therapy  [facility] for tasks assessed/performed       Past Medical History:  Diagnosis Date  . Anxiety   . Depression   . Diabetes mellitus without complication (HCC)   . Hyperlipidemia   . Rheumatoid arthritis (HCC)   . Thyroid disease     Past Surgical History:  Procedure Laterality Date  . ABDOMINAL HYSTERECTOMY    . SHOULDER CLOSED REDUCTION Right 09/07/2014   Procedure: CLOSED REDUCTION SHOULDER;  Surgeon: Nadara Mustard, MD;  Location: [facility] OR;  Service: Orthopedics;  Laterality: Right;    There were no vitals filed for this visit.   Subjective Assessment - 01/18/18 1358    Subjective  Patient had R RTC repair January 02, 2018. Had follow up appointment 01/16/18 when stitches were taken out and told to wear sling for 1 more week. Patient reports that precautions include: wearing sling, no lifting, no driving. Patient has been cleaning with L hand only and has had family/friends bring her food since she cannot cook. Reports moderate discomfort in R arm, but does not take pain meds. Has a hx of L RTC repair 2 years ago.    Pertinent History  RA, thyroid problems, DM, hx of falls    Limitations  Lifting;Writing;House hold activities    Diagnostic tests  None since surgery    Patient Stated Goals  to get this arm moving and get  strength and mobility back, put bra on    Currently in Pain?  Yes    Pain Score  4     Pain Location  Shoulder    Pain Orientation  Right    Pain Descriptors / Indicators  Discomfort    Pain Type  Acute pain    Pain Relieving Factors  icing         [facility] PT Assessment - 01/18/18 0001      Assessment   Medical Diagnosis  Encounter for other specified surgical aftercare (R RTC Repair)    Referring Provider  Jene Every    Onset Date/Surgical Date  01/02/18    Hand Dominance  Right    Next MD Visit  Not scheduled    Prior Therapy  Yes, L RTC repair      Precautions   Precautions  Shoulder    Type of Shoulder Precautions  no lifting, excessive extension, WBing through hands    Shoulder Interventions  Shoulder sling/immobilizer      Restrictions   Weight Bearing Restrictions  Yes NBWing through R UE      Balance Screen   Has the patient fallen in the past 6 months  No    Has the patient had a decrease in activity level because of  a fear of falling?   Yes    Is the patient reluctant to leave their home because of a fear of falling?   No      Home Nurse, mental health  Private residence    Living Arrangements  Alone    Available Help at Discharge  Friend(s)    Type of Home  House    Home Access  Stairs to enter    Entrance Stairs-Number of Steps  3    Entrance Stairs-Rails  Right    Home Layout  Two level    Alternate Level Stairs-Number of Steps  12    Alternate Level Stairs-Rails  Left    Home Equipment  None      Prior Function   Level of Independence  Independent    Vocation  Retired    Leisure  Walking Engineer, materials   Overall Cognitive Status  Within Functional Limits for tasks assessed      Observation/Other Assessments   Focus on Therapeutic Outcomes (FOTO)   Shoulder: 28 (72% limited, 36% predicted)      Sensation   Light Touch  Appears Intact diabetic neuropathy in B feet      Coordination   Gross Motor Movements are Fluid and  Coordinated  Yes      Posture/Postural Control   Posture/Postural Control  Postural limitations    Postural Limitations  Rounded Shoulders;Forward head;Weight shift right    Posture Comments  slouched in sitting      ROM / Strength   AROM / PROM / Strength  AROM;PROM;Strength      AROM   AROM Assessment Site  Shoulder    Right/Left Shoulder  Left    Left Shoulder Flexion  140 Degrees    Left Shoulder ABduction  160 Degrees      PROM   PROM Assessment Site  Shoulder    Right/Left Shoulder  Right;Left    Right Shoulder Flexion  115 Degrees    Right Shoulder ABduction  74 Degrees    Right Shoulder Internal Rotation  60 Degrees    Right Shoulder External Rotation  42 Degrees    Left Shoulder Flexion  170 Degrees    Left Shoulder ABduction  176 Degrees    Left Shoulder Internal Rotation  85 Degrees    Left Shoulder External Rotation  62 Degrees      Strength   Strength Assessment Site  Shoulder    Right/Left Shoulder  Left    Left Shoulder Flexion  4+/5    Left Shoulder ABduction  4+/5    Left Shoulder Internal Rotation  4/5    Left Shoulder External Rotation  4-/5                Objective measurements completed on examination: See above findings.              PT Education - 01/18/18 1458    Education provided  Yes    Education Details  prognosis, POC, HEP    Person(s) Educated  Patient    Methods  Explanation;Demonstration;Tactile cues;Verbal cues;Handout    Comprehension  Verbalized understanding;Returned demonstration       PT Short Term Goals - 01/18/18 1509      PT SHORT TERM GOAL #1   Title  Patient to be independent with initial HEP.    Time  4    Period  Weeks    Status  New  Target Date  02/15/18        PT Long Term Goals - 01/18/18 1510      PT LONG TERM GOAL #1   Title  Patient to be independent with advanced HEP.    Time  8    Period  Weeks    Status  New    Target Date  03/15/18      PT LONG TERM GOAL #2   Title   Patient to demonstrate [facility] R shoulder AROM in order to perform ADLs at home.    Time  8    Period  Weeks    Status  New    Target Date  03/15/18      PT LONG TERM GOAL #3   Title  Patient to demonstrate >=4+/5 R UE strength in order to be able to walk dog on leash with dominant hand.     Time  8    Period  Weeks    Status  New    Target Date  03/15/18      PT LONG TERM GOAL #4   Title  Patient to report 0/10 pain when donning bra.    Time  8    Period  Weeks    Status  New    Target Date  03/15/18             Plan - 01/18/18 1500    Clinical Impression Statement  Patient is a 71y/o F presenting to OPPT after R RTC repair on 12/19/17. Patient arrived with R arm in sling; able to recall most of her precautions Reports she had follow-up appointment earlier this week when MD took out stitches and advised her to keep sling on for 1 week longer. Reminded patient of precautions and advised her to wear sling at night for protection; patient agreeable. Patient has RA and hx of fall resulting in fracture, would benefit from additional balance training to decreased fall risk. Presents with the following impairments: decreased ROM, decreased strength, impaired functional use of UE, pain. Would benefit from skilled PT services in order to address aforementioned impairments. Patient educated on, demonstrated HEP exercises, and received handout. Patient educated on importance of performing passive rather than active exercises at this stage of recovery; patient reported understanding.    History and Personal Factors relevant to plan of care:  L UE RTC repair, RA, DM, hx of falls    Clinical Presentation  Stable    Clinical Decision Making  Low    Rehab Potential  Good    Clinical Impairments Affecting Rehab Potential  previous experience with PT    PT Frequency  2x / week    PT Duration  8 weeks    PT Treatment/Interventions  ADLs/Self Care Home Management;Cryotherapy;Electrical  Stimulation;Iontophoresis 4mg /ml Dexamethasone;Moist Heat;Ultrasound;Gait training;Stair training;Functional mobility training;Balance training;Neuromuscular re-education;Therapeutic activities;Therapeutic exercise;Patient/family education;Manual techniques;Scar mobilization;Passive range of motion;Dry needling;Energy conservation;Taping;Vasopneumatic Device;Splinting    PT Next Visit Plan  Reassess HEP, progress per protocol    Consulted and Agree with Plan of Care  Patient       Patient will benefit from skilled therapeutic intervention in order to improve the following deficits and impairments:  Hypomobility, Decreased activity tolerance, Decreased strength, Impaired UE functional use, Pain, Decreased mobility, Decreased balance, Decreased range of motion, Impaired flexibility, Postural dysfunction  Visit Diagnosis: Acute pain of right shoulder  Stiffness of right shoulder, not elsewhere classified  Muscle weakness (generalized)  Other symptoms and signs involving the musculoskeletal system  Problem List Patient Active Problem List   Diagnosis Date Noted  . Anterior shoulder dislocation 09/07/2014  . Hyperlipidemia     Anette Guarneri, PT, DPT 01/18/18 3:16 PM   [facility] [address]  [address] [place], [place], [address] Phone: [phone]   Fax:  [phone]  Name: Kayla Case MRN: [id number] Date of Birth: 02-14-1946

## 2018-01-23 ENCOUNTER — Ambulatory Visit: Payer: Medicare Other | Admitting: Physical Therapy

## 2018-01-23 DIAGNOSIS — M6281 Muscle weakness (generalized): Secondary | ICD-10-CM

## 2018-01-23 DIAGNOSIS — R29898 Other symptoms and signs involving the musculoskeletal system: Secondary | ICD-10-CM

## 2018-01-23 DIAGNOSIS — M25611 Stiffness of right shoulder, not elsewhere classified: Secondary | ICD-10-CM

## 2018-01-23 DIAGNOSIS — M25511 Pain in right shoulder: Secondary | ICD-10-CM

## 2018-01-23 NOTE — Therapy (Signed)
[facility] Outpatient Rehabilitation [facility] [address]  [address] [place], [place], [address] Phone: [phone]   Fax:  [phone]  Physical Therapy Treatment  Patient Details  Name: Kayla Case MRN: [id number] Date of Birth: [birth year] Referring Provider: Jene Every   Encounter Date: 01/23/2018  PT End of Session - 01/23/18 1505    Visit Number  2    Number of Visits  17    Date for PT Re-Evaluation  03/15/18    Authorization Type  Medicare + Federal BCBS- VL 75    PT Start Time  1400    PT Stop Time  1449    PT Time Calculation (min)  49 min    Activity Tolerance  Patient tolerated treatment well    Behavior During Therapy  [facility] for tasks assessed/performed       Past Medical History:  Diagnosis Date  . Anxiety   . Depression   . Diabetes mellitus without complication (HCC)   . Hyperlipidemia   . Rheumatoid arthritis (HCC)   . Thyroid disease     Past Surgical History:  Procedure Laterality Date  . ABDOMINAL HYSTERECTOMY    . SHOULDER CLOSED REDUCTION Right 09/07/2014   Procedure: CLOSED REDUCTION SHOULDER;  Surgeon: Nadara Mustard, MD;  Location: [facility] OR;  Service: Orthopedics;  Laterality: Right;    There were no vitals filed for this visit.  Subjective Assessment - 01/23/18 1404    Subjective  Patient not wearing sling but using intermittently. Reports she had a busy weekend and having a little bit of soreness. Reports she tries not to over-do it. Having some trouble with the cane exercise.    Pertinent History  RA, thyroid problems, DM, hx of falls    Diagnostic tests  None since surgery    Patient Stated Goals  to get this arm moving and get strength and mobility back, put bra on    Currently in Pain?  Yes    Pain Score  3     Pain Location  Shoulder    Pain Orientation  Right    Pain Descriptors / Indicators  Discomfort    Pain Type  Acute pain                       [facility] Adult PT Treatment/Exercise -  01/23/18 0001      Exercises   Exercises  Shoulder;Elbow      Elbow Exercises   Elbow Flexion  AROM;Right;20 reps;Seated      Shoulder Exercises: Supine   Other Supine Exercises  Wand PROM IR/ER with elbows at sides; 2x10x3 sec hold B UE to tolerance      Shoulder Exercises: Seated   Other Seated Exercises  R UE pinch grip dynamometer squeeze; 10x      Shoulder Exercises: Standing   Other Standing Exercises  Pendulum R UE leaning over counter; 3x 30 sec      Modalities   Modalities  Vasopneumatic      Vasopneumatic   Number Minutes Vasopneumatic   10 minutes    Vasopnuematic Location   Shoulder R    Vasopneumatic Pressure  Low    Vasopneumatic Temperature   Coldest      Manual Therapy   Manual Therapy  Joint mobilization;Soft tissue mobilization;Passive ROM    Joint Mobilization  grade I/II posterior/inferior mobs to R shoulder    Soft tissue mobilization  R deltoid    Passive ROM  R shoulder  flex, ABD, IR, ER to pt tolerance pt with intermittent pain in deltoid               PT Short Term Goals - 01/18/18 1509      PT SHORT TERM GOAL #1   Title  Patient to be independent with initial HEP.    Time  4    Period  Weeks    Status  New    Target Date  02/15/18        PT Long Term Goals - 01/18/18 1510      PT LONG TERM GOAL #1   Title  Patient to be independent with advanced HEP.    Time  8    Period  Weeks    Status  New    Target Date  03/15/18      PT LONG TERM GOAL #2   Title  Patient to demonstrate [facility] R shoulder AROM in order to perform ADLs at home.    Time  8    Period  Weeks    Status  New    Target Date  03/15/18      PT LONG TERM GOAL #3   Title  Patient to demonstrate >=4+/5 R UE strength in order to be able to walk dog on leash with dominant hand.     Time  8    Period  Weeks    Status  New    Target Date  03/15/18      PT LONG TERM GOAL #4   Title  Patient to report 0/10 pain when donning bra.    Time  8    Period  Weeks     Status  New    Target Date  03/15/18            Plan - 01/23/18 1505    Clinical Impression Statement  Patient arrived to session with report of mild soreness in R shoulder. Reports trying not to over-do it since she has not been using a sling. Using it intermittently d/t shoulder soreness. Patient tolerated R shoulder PROM in flexion, abduction, and IR/ER in 45 degrees of abduction to tolerance. Patient with intermittent pain in R deltoid with flexion PROM. Also tolerated R shoulder joint mobs grade I/II in posterior and inferior directions and STM to R deltoid. Patient requiring significant VC/TCs to avoid active motion of R shoulder during pendulum and wand exercises, as well as reminders to avoid pushing up with R UE during STS and transferring positions on treatment mat. Advised patient to continue using sling if she notices that she is over-using her R UE to avoid injury; patient reported understanding. Received Gameready to R UE; normal integumentary response at end of session.    PT Treatment/Interventions  ADLs/Self Care Home Management;Cryotherapy;Electrical Stimulation;Iontophoresis 4mg /ml Dexamethasone;Moist Heat;Ultrasound;Gait training;Stair training;Functional mobility training;Balance training;Neuromuscular re-education;Therapeutic activities;Therapeutic exercise;Patient/family education;Manual techniques;Scar mobilization;Passive range of motion;Dry needling;Energy conservation;Taping;Vasopneumatic Device;Splinting    PT Next Visit Plan  Reassess HEP, progress per protocol    Consulted and Agree with Plan of Care  Patient       Patient will benefit from skilled therapeutic intervention in order to improve the following deficits and impairments:  Hypomobility, Decreased activity tolerance, Decreased strength, Impaired UE functional use, Pain, Decreased mobility, Decreased balance, Decreased range of motion, Impaired flexibility, Postural dysfunction  Visit Diagnosis: Acute pain  of right shoulder  Stiffness of right shoulder, not elsewhere classified  Muscle weakness (generalized)  Other symptoms and signs involving the musculoskeletal  system     Problem List Patient Active Problem List   Diagnosis Date Noted  . Anterior shoulder dislocation 09/07/2014  . Hyperlipidemia     Anette Guarneri, PT, DPT 01/23/18 3:08 PM   [facility] Outpatient Rehabilitation [facility] [address]  [address] [place], [place], [address] Phone: [phone]   Fax:  [phone]  Name: Toria Monte MRN: [id number] Date of Birth: 01-09-1946

## 2018-01-26 ENCOUNTER — Encounter: Payer: Self-pay | Admitting: Physical Therapy

## 2018-01-26 ENCOUNTER — Ambulatory Visit: Payer: Medicare Other | Admitting: Physical Therapy

## 2018-01-26 DIAGNOSIS — M6281 Muscle weakness (generalized): Secondary | ICD-10-CM

## 2018-01-26 DIAGNOSIS — R29898 Other symptoms and signs involving the musculoskeletal system: Secondary | ICD-10-CM

## 2018-01-26 DIAGNOSIS — M25511 Pain in right shoulder: Secondary | ICD-10-CM | POA: Diagnosis not present

## 2018-01-26 DIAGNOSIS — M25611 Stiffness of right shoulder, not elsewhere classified: Secondary | ICD-10-CM

## 2018-01-26 NOTE — Therapy (Signed)
[facility] Outpatient Rehabilitation [facility] [address]  [address] [place], [place], [address] Phone: [phone]   Fax:  [phone]  Physical Therapy Treatment  Patient Details  Name: Kayla Case MRN: [id number] Date of Birth: May 21, 1946 Referring Provider: Jene Every   Encounter Date: 01/26/2018  PT End of Session - 01/26/18 1350    Visit Number  3    Number of Visits  17    Date for PT Re-Evaluation  03/15/18    Authorization Type  Medicare + Federal BCBS- VL 75    PT Start Time  1309    PT Stop Time  1358    PT Time Calculation (min)  49 min    Activity Tolerance  Patient tolerated treatment well    Behavior During Therapy  [facility] for tasks assessed/performed       Past Medical History:  Diagnosis Date  . Anxiety   . Depression   . Diabetes mellitus without complication (HCC)   . Hyperlipidemia   . Rheumatoid arthritis (HCC)   . Thyroid disease     Past Surgical History:  Procedure Laterality Date  . ABDOMINAL HYSTERECTOMY    . SHOULDER CLOSED REDUCTION Right 09/07/2014   Procedure: CLOSED REDUCTION SHOULDER;  Surgeon: Nadara Mustard, MD;  Location: [facility] OR;  Service: Orthopedics;  Laterality: Right;    There were no vitals filed for this visit.  Subjective Assessment - 01/26/18 1310    Subjective  Patient reports some discomfort in R shoulder. Has been wearing sling intermittently for support.    Pertinent History  RA, thyroid problems, DM, hx of falls    Diagnostic tests  None since surgery    Patient Stated Goals  to get this arm moving and get strength and mobility back, put bra on    Currently in Pain?  Yes    Pain Score  3     Pain Location  Shoulder    Pain Orientation  Right    Pain Descriptors / Indicators  Discomfort    Pain Type  Acute pain                       [facility] Adult PT Treatment/Exercise - 01/26/18 0001      Elbow Exercises   Elbow Flexion  AROM;Right;20 reps;Seated      Shoulder Exercises:  Supine   Other Supine Exercises  Wand PROM IR/ER with elbows at sides; 2x10x3 sec hold B UE to tolerance    Other Supine Exercises  B scapular retraction 10x3 sec      Shoulder Exercises: Standing   Other Standing Exercises  Pendulum R UE leaning over counter; 2x 30 sec reports back pain- discontinued      Shoulder Exercises: Isometric Strengthening   Flexion  5X10";Limitations    Flexion Limitations  R UE; 10% effort      Vasopneumatic   Number Minutes Vasopneumatic   10 minutes    Vasopnuematic Location   Shoulder R    Vasopneumatic Pressure  Medium    Vasopneumatic Temperature   Coldest      Manual Therapy   Manual Therapy  Joint mobilization;Soft tissue mobilization;Passive ROM    Joint Mobilization  grade I/II posterior/inferior mobs to R shoulder    Soft tissue mobilization  R UT, deltoid,     Passive ROM  R shoulder flex, ABD, IR, ER to pt tolerance  PT Short Term Goals - 01/18/18 1509      PT SHORT TERM GOAL #1   Title  Patient to be independent with initial HEP.    Time  4    Period  Weeks    Status  New    Target Date  02/15/18        PT Long Term Goals - 01/18/18 1510      PT LONG TERM GOAL #1   Title  Patient to be independent with advanced HEP.    Time  8    Period  Weeks    Status  New    Target Date  03/15/18      PT LONG TERM GOAL #2   Title  Patient to demonstrate [facility] R shoulder AROM in order to perform ADLs at home.    Time  8    Period  Weeks    Status  New    Target Date  03/15/18      PT LONG TERM GOAL #3   Title  Patient to demonstrate >=4+/5 R UE strength in order to be able to walk dog on leash with dominant hand.     Time  8    Period  Weeks    Status  New    Target Date  03/15/18      PT LONG TERM GOAL #4   Title  Patient to report 0/10 pain when donning bra.    Time  8    Period  Weeks    Status  New    Target Date  03/15/18            Plan - 01/26/18 1404    Clinical Impression Statement   Patient arrived to session reporting no new complaints; still some discomfort in R shoulder and has been using sling intermittently. Tolerated PROM to R shoulder into flex ,abd, IR, ER- mild catching sensation in deltoid on return from flexion. Tolerated STM to R upper trap, deltoid, suboccipitals without pain and R shoulder grade I/II joint mobs in posterior and inferior directions. Patient performed standing pendulums on R shoulder- better technique and carryover this date, still requiring VC/TCs to ensure relaxed posture. Patient limited by back pain this date- discontinued pendulums d/t back pain. Received Gameready to R shoulder at end of session- normal integumentary response observed. Patient seen lifting R UE to lift bag; reminded patient to avoid lifting and follow surgical precautions- patient agreeable.    PT Treatment/Interventions  ADLs/Self Care Home Management;Cryotherapy;Electrical Stimulation;Iontophoresis 4mg /ml Dexamethasone;Moist Heat;Ultrasound;Gait training;Stair training;Functional mobility training;Balance training;Neuromuscular re-education;Therapeutic activities;Therapeutic exercise;Patient/family education;Manual techniques;Scar mobilization;Passive range of motion;Dry needling;Energy conservation;Taping;Vasopneumatic Device;Splinting    PT Next Visit Plan  Patient will be 4 weeks post-op at next visit- progress per protocol    Consulted and Agree with Plan of Care  Patient       Patient will benefit from skilled therapeutic intervention in order to improve the following deficits and impairments:  Hypomobility, Decreased activity tolerance, Decreased strength, Impaired UE functional use, Pain, Decreased mobility, Decreased balance, Decreased range of motion, Impaired flexibility, Postural dysfunction  Visit Diagnosis: Acute pain of right shoulder  Stiffness of right shoulder, not elsewhere classified  Muscle weakness (generalized)  Other symptoms and signs involving the  musculoskeletal system     Problem List Patient Active Problem List   Diagnosis Date Noted  . Anterior shoulder dislocation 09/07/2014  . Hyperlipidemia      11/06/2014, PT, DPT 01/26/18 2:07 PM   [place]  Outpatient Rehabilitation [facility] [address]  [address] [place], [place], [address] Phone: [phone]   Fax:  [phone]  Name: Kayla Case MRN: [id number] Date of Birth: 1945-12-04

## 2018-01-31 ENCOUNTER — Ambulatory Visit: Payer: Medicare Other

## 2018-01-31 DIAGNOSIS — M25511 Pain in right shoulder: Secondary | ICD-10-CM | POA: Diagnosis not present

## 2018-01-31 DIAGNOSIS — M25611 Stiffness of right shoulder, not elsewhere classified: Secondary | ICD-10-CM

## 2018-01-31 DIAGNOSIS — M6281 Muscle weakness (generalized): Secondary | ICD-10-CM

## 2018-01-31 DIAGNOSIS — R29898 Other symptoms and signs involving the musculoskeletal system: Secondary | ICD-10-CM

## 2018-01-31 NOTE — Therapy (Signed)
[facility] [address]  [address] [place], [place], [address] Phone: [phone]   Fax:  [phone]  Physical Therapy Treatment  Patient Details  Name: Kayla Case MRN: [id number] Date of Birth: 1946/02/23 Referring Provider: Jene Every   Encounter Date: 01/31/2018  PT End of Session - 01/31/18 1104    Visit Number  4    Number of Visits  17    Date for PT Re-Evaluation  03/15/18    Authorization Type  Medicare + Federal BCBS- VL 75    PT Start Time  1102    PT Stop Time  1204    PT Time Calculation (min)  62 min    Activity Tolerance  Patient tolerated treatment well    Behavior During Therapy  WFL for tasks assessed/performed       Past Medical History:  Diagnosis Date  . Anxiety   . Depression   . Diabetes mellitus without complication (HCC)   . Hyperlipidemia   . Rheumatoid arthritis (HCC)   . Thyroid disease     Past Surgical History:  Procedure Laterality Date  . ABDOMINAL HYSTERECTOMY    . SHOULDER CLOSED REDUCTION Right 09/07/2014   Procedure: CLOSED REDUCTION SHOULDER;  Surgeon: Nadara Mustard, MD;  Location: [facility] OR;  Service: Orthopedics;  Laterality: Right;    There were no vitals filed for this visit.  Subjective Assessment - 01/31/18 1103    Subjective  Pt. noting she may be gone for granddaughters graduation from 6.8.19 - 6.15.19.      Pertinent History  RA, thyroid problems, DM, hx of falls    Diagnostic tests  None since surgery    Patient Stated Goals  to get this arm moving and get strength and mobility back, put bra on    Currently in Pain?  Yes    Pain Score  3     Pain Location  Shoulder    Pain Orientation  Right    Pain Descriptors / Indicators  Discomfort    Pain Type  Acute pain    Pain Relieving Factors  Icing     Multiple Pain Sites  No                       [facility] Adult PT Treatment/Exercise - 01/31/18 1126      Elbow Exercises   Elbow Flexion   AROM;Right;20 reps;Seated      Shoulder Exercises: Supine   External Rotation  Right;AAROM;10 reps    External Rotation Limitations  wand; two pillows to maintain scapular plane under elbow    Other Supine Exercises  Wand PROM IR/ER with elbows at sides and pillow to elevated into scapular plane; 2x15x3 sec hold B UE to tolerance    Other Supine Exercises  B scapular retraction 15 x 5 sec Mirror feedback to prevent scap. elevation       Shoulder Exercises: Standing   Other Standing Exercises  Pendulum R UE leaning over counter; 2x 30 sec      Shoulder Exercises: Stretch   Other Shoulder Stretches  R UT stretch in seated 2 x 30 sec       Modalities   Modalities  Electrical Stimulation      Electrical Stimulation   Electrical Stimulation Location  R shoulder complex     Electrical Stimulation Action  IFC    Electrical Stimulation Parameters  intensity to pt. tolerance, 15'     Statistician  Goals  Pain      Vasopneumatic   Number Minutes Vasopneumatic   15 minutes    Vasopnuematic Location   Shoulder R    Vasopneumatic Pressure  Medium    Vasopneumatic Temperature   Coldest      Manual Therapy   Manual Therapy  Soft tissue mobilization;Passive ROM    Soft tissue mobilization  R anterior/lateral deltoid - some tenderness, palpable TP    Passive ROM  R shoulder flex, ABD, IR, ER to pt tolerance             PT Education - 01/31/18 1220    Education provided  Yes    Education Details  HEP update     Person(s) Educated  Patient    Methods  Explanation;Demonstration;Verbal cues;Handout    Comprehension  Verbalized understanding;Returned demonstration;Verbal cues required;Need further instruction       PT Short Term Goals - 01/31/18 1126      PT SHORT TERM GOAL #1   Title  Patient to be independent with initial HEP.    Time  4    Period  Weeks    Status  On-going        PT Long Term Goals - 01/31/18 1126      PT LONG TERM GOAL #1   Title  Patient to be  independent with advanced HEP.    Time  8    Period  Weeks    Status  On-going      PT LONG TERM GOAL #2   Title  Patient to demonstrate [facility] R shoulder AROM in order to perform ADLs at home.    Time  8    Period  Weeks    Status  On-going      PT LONG TERM GOAL #3   Title  Patient to demonstrate >=4+/5 R UE strength in order to be able to walk dog on leash with dominant hand.     Time  8    Period  Weeks    Status  On-going      PT LONG TERM GOAL #4   Title  Patient to report 0/10 pain when donning bra.    Time  8    Period  Weeks    Status  On-going            Plan - 01/31/18 1105    Clinical Impression Statement  Pt. tolerated session well today with minor progression in reps with scapular retraction and elbow AROM activities per protocol.  Pt. noting improvement in tenderness in R UT however did have tenderness/TP in R anterior/lateral deltoid today which was addresed with manual therapy.  Pt. requesting ice/E-stim combo to end treatment and reporting she was pain free to end session.  Will continue to progress per protocol.      PT Treatment/Interventions  ADLs/Self Care Home Management;Cryotherapy;Electrical Stimulation;Iontophoresis 4mg /ml Dexamethasone;Moist Heat;Ultrasound;Gait training;Stair training;Functional mobility training;Balance training;Neuromuscular re-education;Therapeutic activities;Therapeutic exercise;Patient/family education;Manual techniques;Scar mobilization;Passive range of motion;Dry needling;Energy conservation;Taping;Vasopneumatic Device;Splinting    PT Next Visit Plan  Progress per protocol    Consulted and Agree with Plan of Care  Patient       Patient will benefit from skilled therapeutic intervention in order to improve the following deficits and impairments:  Hypomobility, Decreased activity tolerance, Decreased strength, Impaired UE functional use, Pain, Decreased mobility, Decreased balance, Decreased range of motion, Impaired flexibility,  Postural dysfunction  Visit Diagnosis: Acute pain of right shoulder  Stiffness of right shoulder, not elsewhere classified  Muscle weakness (generalized)  Other symptoms and signs involving the musculoskeletal system     Problem List Patient Active Problem List   Diagnosis Date Noted  . Anterior shoulder dislocation 09/07/2014  . Hyperlipidemia     Kermit Balo, PTA 01/31/18 12:20 PM  [facility] [address]  [address] [place], [place], [address] Phone: [phone]   Fax:  [phone]  Name: Kayla Case MRN: [id number] Date of Birth: Aug 26, 1946

## 2018-02-02 ENCOUNTER — Ambulatory Visit: Payer: Medicare Other

## 2018-02-02 DIAGNOSIS — M6281 Muscle weakness (generalized): Secondary | ICD-10-CM

## 2018-02-02 DIAGNOSIS — R29898 Other symptoms and signs involving the musculoskeletal system: Secondary | ICD-10-CM

## 2018-02-02 DIAGNOSIS — M25511 Pain in right shoulder: Secondary | ICD-10-CM | POA: Diagnosis not present

## 2018-02-02 DIAGNOSIS — M25611 Stiffness of right shoulder, not elsewhere classified: Secondary | ICD-10-CM

## 2018-02-02 NOTE — Therapy (Signed)
[facility] Outpatient Rehabilitation [facility] [address]  [address] [place], [place], [address] Phone: [phone]   Fax:  [phone]  Physical Therapy Treatment  Patient Details  Name: Kayla Case MRN: [id number] Date of Birth: 11/28/1945 Referring Provider: Jene Case   Encounter Date: 02/02/2018  PT End of Session - 02/02/18 1455    Visit Number  5    Number of Visits  17    Date for PT Re-Evaluation  03/15/18    Authorization Type  Medicare + Federal BCBS- VL 75    PT Start Time  1448    PT Stop Time  1530    PT Time Calculation (min)  42 min    Activity Tolerance  Patient tolerated treatment well    Behavior During Therapy  [facility] for tasks assessed/performed       Past Medical History:  Diagnosis Date  . Anxiety   . Depression   . Diabetes mellitus without complication (HCC)   . Hyperlipidemia   . Rheumatoid arthritis (HCC)   . Thyroid disease     Past Surgical History:  Procedure Laterality Date  . ABDOMINAL HYSTERECTOMY    . SHOULDER CLOSED REDUCTION Right 09/07/2014   Procedure: CLOSED REDUCTION SHOULDER;  Surgeon: Kayla Mustard, MD;  Location: [facility] OR;  Service: Orthopedics;  Laterality: Right;    There were no vitals filed for this visit.  Subjective Assessment - 02/02/18 1453    Subjective  Pt. noting she had some soreness in R shoulder which she attributes to doing wand exercises at home.      Pertinent History  RA, thyroid problems, DM, hx of falls    Diagnostic tests  None since surgery    Patient Stated Goals  to get this arm moving and get strength and mobility back, put bra on    Currently in Pain?  Yes    Pain Score  1     Pain Location  Shoulder    Pain Orientation  Right    Pain Descriptors / Indicators  Sore    Pain Type  Acute pain    Pain Relieving Factors  icing     Multiple Pain Sites  No                       [facility] Adult PT Treatment/Exercise - 02/02/18 1504      Elbow Exercises   Elbow  Flexion  AROM;Right;20 reps;Seated      Shoulder Exercises: Supine   External Rotation  Right;AAROM;10 reps    External Rotation Limitations  wand; two pillows to maintain scapular plane under elbow    Other Supine Exercises  Wand PROM IR/ER with elbows at sides and pillow to elevated into scapular plane; 2x15x3 sec hold B UE to tolerance    Other Supine Exercises  B scapular retraction 15 x 5 sec      Shoulder Exercises: Seated   Retraction  Both;15 reps 5" hold     Other Seated Exercises  R UE pinch grip dynamometer squeeze x 15 reps       Shoulder Exercises: Stretch   Other Shoulder Stretches  R UT stretch in seated 2 x 30 sec       Manual Therapy   Manual Therapy  Soft tissue mobilization;Passive ROM    Soft tissue mobilization  R anterior/lateral deltoid - some tenderness, palpable TP    Passive ROM  R shoulder flex, ABD, IR, ER to pt tolerance  PT Short Term Goals - 01/31/18 1126      PT SHORT TERM GOAL #1   Title  Patient to be independent with initial HEP.    Time  4    Period  Weeks    Status  On-going        PT Long Term Goals - 01/31/18 1126      PT LONG TERM GOAL #1   Title  Patient to be independent with advanced HEP.    Time  8    Period  Weeks    Status  On-going      PT LONG TERM GOAL #2   Title  Patient to demonstrate [facility] R shoulder AROM in order to perform ADLs at home.    Time  8    Period  Weeks    Status  On-going      PT LONG TERM GOAL #3   Title  Patient to demonstrate >=4+/5 R UE strength in order to be able to walk dog on leash with dominant hand.     Time  8    Period  Weeks    Status  On-going      PT LONG TERM GOAL #4   Title  Patient to report 0/10 pain when donning bra.    Time  8    Period  Weeks    Status  On-going            Plan - 02/02/18 1456    Clinical Impression Statement  Kayla Case reporting some soreness in R lateral shoulder today however pain free at rest to begin treatment.  Tolerated  mild progression in wrist, elbow AROM activities well today per protocol.  R shoulder PROM visibly improved however not formally measured today.  Tenderness in R lateral deltoid addressed with manual STM with some relief following.  Tolerated al activities well in session and seems to be progressing well at this point.      Clinical Impairments Affecting Rehab Potential  previous experience with PT    PT Treatment/Interventions  ADLs/Self Care Home Management;Cryotherapy;Electrical Stimulation;Iontophoresis 4mg /ml Dexamethasone;Moist Heat;Ultrasound;Gait training;Stair training;Functional mobility training;Balance training;Neuromuscular re-education;Therapeutic activities;Therapeutic exercise;Patient/family education;Manual techniques;Scar mobilization;Passive range of motion;Dry needling;Energy conservation;Taping;Vasopneumatic Device;Splinting    PT Next Visit Plan  Progress per protocol    Consulted and Agree with Plan of Care  Patient       Patient will benefit from skilled therapeutic intervention in order to improve the following deficits and impairments:  Hypomobility, Decreased activity tolerance, Decreased strength, Impaired UE functional use, Pain, Decreased mobility, Decreased balance, Decreased range of motion, Impaired flexibility, Postural dysfunction  Visit Diagnosis: Acute pain of right shoulder  Stiffness of right shoulder, not elsewhere classified  Muscle weakness (generalized)  Other symptoms and signs involving the musculoskeletal system     Problem List Patient Active Problem List   Diagnosis Date Noted  . Anterior shoulder dislocation 09/07/2014  . Hyperlipidemia     11/06/2014, PTA 02/02/18 5:35 PM  [facility] [address]  [address] [place], [place], [place] Phone: [phone]   Fax:  [phone]  Name: Kayla Case MRN: Kayla Case Date of Birth: 05/04/1946

## 2018-02-06 ENCOUNTER — Ambulatory Visit: Payer: Medicare Other | Admitting: Physical Therapy

## 2018-02-09 ENCOUNTER — Ambulatory Visit: Payer: Medicare Other | Attending: Specialist | Admitting: Physical Therapy

## 2018-02-09 ENCOUNTER — Encounter: Payer: Self-pay | Admitting: Physical Therapy

## 2018-02-09 DIAGNOSIS — M25511 Pain in right shoulder: Secondary | ICD-10-CM | POA: Diagnosis present

## 2018-02-09 DIAGNOSIS — M6281 Muscle weakness (generalized): Secondary | ICD-10-CM | POA: Insufficient documentation

## 2018-02-09 DIAGNOSIS — M25611 Stiffness of right shoulder, not elsewhere classified: Secondary | ICD-10-CM | POA: Insufficient documentation

## 2018-02-09 DIAGNOSIS — R29898 Other symptoms and signs involving the musculoskeletal system: Secondary | ICD-10-CM | POA: Diagnosis present

## 2018-02-09 NOTE — Therapy (Signed)
[facility] [address]  [address] [place], [place], [address] Phone: [phone]   Fax:  [phone]  Physical Therapy Treatment  Patient Details  Name: Kayla Case MRN: [id number] Date of Birth: 02-12-1946 Referring Provider: Jene Every   Encounter Date: 02/09/2018  PT End of Session - 02/09/18 1545    Visit Number  6    Number of Visits  17    Date for PT Re-Evaluation  03/15/18    Authorization Type  Medicare + Federal BCBS- VL 75    PT Start Time  1358    PT Stop Time  1451    PT Time Calculation (min)  53 min    Activity Tolerance  Patient tolerated treatment well    Behavior During Therapy  [facility] for tasks assessed/performed       Past Medical History:  Diagnosis Date  . Anxiety   . Depression   . Diabetes mellitus without complication (HCC)   . Hyperlipidemia   . Rheumatoid arthritis (HCC)   . Thyroid disease     Past Surgical History:  Procedure Laterality Date  . ABDOMINAL HYSTERECTOMY    . SHOULDER CLOSED REDUCTION Right 09/07/2014   Procedure: CLOSED REDUCTION SHOULDER;  Surgeon: Nadara Mustard, MD;  Location: [facility] OR;  Service: Orthopedics;  Laterality: Right;    There were no vitals filed for this visit.  Subjective Assessment - 02/09/18 1359    Subjective  Patient reports that her R shoulder is okay until she does something unconsciously and then it hurts.     Pertinent History  RA, thyroid problems, DM, hx of falls    Diagnostic tests  None since surgery    Patient Stated Goals  to get this arm moving and get strength and mobility back, put bra on    Currently in Pain?  No/denies    Pain Location  Shoulder    Pain Orientation  Right                       [facility] Adult PT Treatment/Exercise - 02/09/18 0001      Elbow Exercises   Elbow Flexion  Strengthening;Right;10 reps;Theraband;Standing yellow TB; 2x10 R UE      Shoulder Exercises: Supine   External Rotation   Right;AAROM;10 reps    External Rotation Limitations  wand; elbows at sides VCs to avoid pushing into pain    Flexion  AAROM;Right;Left;10 reps patient only able to tolerate ~7 reps with limited ROM    Other Supine Exercises  serratus punch with R UE supported by PT; 10x patient unable to tolerate      Shoulder Exercises: Prone   Other Prone Exercises  row with scapular retraction; 15x    Other Prone Exercises  extension with scapular retraction; 15x      Shoulder Exercises: Pulleys   Flexion  3 minutes VCs to avoid pulling too hard      Vasopneumatic   Number Minutes Vasopneumatic   15 minutes    Vasopnuematic Location   Shoulder R    Vasopneumatic Pressure  Medium    Vasopneumatic Temperature   Coldest      Manual Therapy   Manual Therapy  Soft tissue mobilization;Joint mobilization    Joint Mobilization  Grade III R shoulder inferior/posterior mobs to tolerance    Soft tissue mobilization  R UT- TTP    Passive ROM  R shoulder flex, ABD, IR, ER to pt tolerance  PT Education - 02/09/18 1439    Education provided  Yes    Education Details  Addition to HEP and given yellow TB    Person(s) Educated  Patient    Methods  Explanation;Demonstration;Tactile cues;Verbal cues;Handout    Comprehension  Verbalized understanding;Returned demonstration       PT Short Term Goals - 02/09/18 1544      PT SHORT TERM GOAL #1   Title  Patient to be independent with initial HEP.    Time  4    Period  Weeks    Status  Achieved        PT Long Term Goals - 01/31/18 1126      PT LONG TERM GOAL #1   Title  Patient to be independent with advanced HEP.    Time  8    Period  Weeks    Status  On-going      PT LONG TERM GOAL #2   Title  Patient to demonstrate [facility] R shoulder AROM in order to perform ADLs at home.    Time  8    Period  Weeks    Status  On-going      PT LONG TERM GOAL #3   Title  Patient to demonstrate >=4+/5 R UE strength in order to be able to walk dog  on leash with dominant hand.     Time  8    Period  Weeks    Status  On-going      PT LONG TERM GOAL #4   Title  Patient to report 0/10 pain when donning bra.    Time  8    Period  Weeks    Status  On-going            Plan - 02/09/18 1450    Clinical Impression Statement  Patient arrived to appointment with report that R shoulder feels okay until she over-does something subconsciously. Patient performed pulley into flexion on B UEs- report of moderate pain and advised not to push into the range of pain. Tolerated R shoulder PROM, joint mobs, and STM to R shoulder- tolerated well but c/o mild pain with end range PROM. Attempted supine AAROM with wand into flexion this date- patient with c/o pain and not able to tolerate 10 reps- discontinued. Also attempted supine serratus punches with support on R UE- unable to tolerate and discontinued. Patient able to progress to prone rows and prone extension without pain. Also able to progress to resisted bicep curls this date. New HEP given to patient- reported understanding. Received [place] to R shoulder at end of session- relief of symptoms reported.    PT Treatment/Interventions  ADLs/Self Care Home Management;Cryotherapy;Electrical Stimulation;Iontophoresis 4mg /ml Dexamethasone;Moist Heat;Ultrasound;Gait training;Stair training;Functional mobility training;Balance training;Neuromuscular re-education;Therapeutic activities;Therapeutic exercise;Patient/family education;Manual techniques;Scar mobilization;Passive range of motion;Dry needling;Energy conservation;Taping;Vasopneumatic Device;Splinting    PT Next Visit Plan  reassess supine AAROM, pulley, serratus punches       Patient will benefit from skilled therapeutic intervention in order to improve the following deficits and impairments:  Hypomobility, Decreased activity tolerance, Decreased strength, Impaired UE functional use, Pain, Decreased mobility, Decreased balance, Decreased range of  motion, Impaired flexibility, Postural dysfunction  Visit Diagnosis: Acute pain of right shoulder  Stiffness of right shoulder, not elsewhere classified  Muscle weakness (generalized)  Other symptoms and signs involving the musculoskeletal system     Problem List Patient Active Problem List   Diagnosis Date Noted  . Anterior shoulder dislocation 09/07/2014  . Hyperlipidemia  Anette Guarneri, PT, DPT 02/09/18 3:47 PM  [facility] [address]  [address] [place], [place], [address] Phone: [phone]   Fax:  [phone]  Name: Kayla Case MRN: [id number] Date of Birth: [birth year]

## 2018-02-21 ENCOUNTER — Ambulatory Visit: Payer: Medicare Other | Admitting: Physical Therapy

## 2018-02-23 ENCOUNTER — Ambulatory Visit: Payer: Medicare Other

## 2018-02-27 ENCOUNTER — Ambulatory Visit: Payer: Medicare Other

## 2018-02-27 DIAGNOSIS — M25511 Pain in right shoulder: Secondary | ICD-10-CM

## 2018-02-27 DIAGNOSIS — M25611 Stiffness of right shoulder, not elsewhere classified: Secondary | ICD-10-CM

## 2018-02-27 DIAGNOSIS — R29898 Other symptoms and signs involving the musculoskeletal system: Secondary | ICD-10-CM

## 2018-02-27 DIAGNOSIS — M6281 Muscle weakness (generalized): Secondary | ICD-10-CM

## 2018-02-27 NOTE — Therapy (Signed)
[facility] [address]  [address] [place], [place], [address] Phone: [phone]   Fax:  [phone]  Physical Therapy Treatment  Patient Details  Name: Kayla Case MRN: [id number] Date of Birth: 1946-07-25 Referring Provider: Jene Every   Encounter Date: 02/27/2018  PT End of Session - 02/27/18 1403    Visit Number  7    Number of Visits  17    Date for PT Re-Evaluation  03/15/18    Authorization Type  Medicare + Federal BCBS- VL 75    PT Start Time  1402    PT Stop Time  1445    PT Time Calculation (min)  43 min    Activity Tolerance  Patient tolerated treatment well    Behavior During Therapy  [facility] for tasks assessed/performed       Past Medical History:  Diagnosis Date  . Anxiety   . Depression   . Diabetes mellitus without complication (HCC)   . Hyperlipidemia   . Rheumatoid arthritis (HCC)   . Thyroid disease     Past Surgical History:  Procedure Laterality Date  . ABDOMINAL HYSTERECTOMY    . SHOULDER CLOSED REDUCTION Right 09/07/2014   Procedure: CLOSED REDUCTION SHOULDER;  Surgeon: Nadara Mustard, MD;  Location: [facility] OR;  Service: Orthopedics;  Laterality: Right;    There were no vitals filed for this visit.  Subjective Assessment - 02/27/18 1409    Subjective  Pt. reporting she has had increased R shoulder pain over the last few.      Pertinent History  RA, thyroid problems, DM, hx of falls    Diagnostic tests  None since surgery    Patient Stated Goals  to get this arm moving and get strength and mobility back, put bra on    Currently in Pain?  Yes    Pain Score  4     Pain Location  Shoulder    Pain Orientation  Right    Pain Descriptors / Indicators  Sore    Pain Type  Acute pain    Pain Relieving Factors  Icing     Multiple Pain Sites  No                       [facility] Adult PT Treatment/Exercise - 02/27/18 1413      Shoulder Exercises: Supine   Protraction  Right;10 reps     External Rotation  Right;AAROM;10 reps    External Rotation Limitations  wand; 90/90 position     Flexion  AAROM;Right;Left;10 reps good tolerance after cueing to use L UE to support R     ABduction  Right;AAROM terminated at pt. reporting pain and having difficulty     Other Supine Exercises  R shoulder circles x 10 reps CW, CCW without assistance       Shoulder Exercises: Seated   Flexion  Right;10 reps;AAROM    Flexion Limitations  seated p-ball roll-outs       Shoulder Exercises: Pulleys   Flexion  3 minutes Cues to avoid R shoulder guarding     Scaption  3 minutes      Shoulder Exercises: ROM/Strengthening   UBE (Upper Arm Bike)  Lvl 1.0, 3 min each way       Vasopneumatic   Number Minutes Vasopneumatic   15 minutes    Vasopnuematic Location   Shoulder R    Vasopneumatic Pressure  Medium    Vasopneumatic Temperature  Coldest               PT Short Term Goals - 02/09/18 1544      PT SHORT TERM GOAL #1   Title  Patient to be independent with initial HEP.    Time  4    Period  Weeks    Status  Achieved        PT Long Term Goals - 01/31/18 1126      PT LONG TERM GOAL #1   Title  Patient to be independent with advanced HEP.    Time  8    Period  Weeks    Status  On-going      PT LONG TERM GOAL #2   Title  Patient to demonstrate [facility] R shoulder AROM in order to perform ADLs at home.    Time  8    Period  Weeks    Status  On-going      PT LONG TERM GOAL #3   Title  Patient to demonstrate >=4+/5 R UE strength in order to be able to walk dog on leash with dominant hand.     Time  8    Period  Weeks    Status  On-going      PT LONG TERM GOAL #4   Title  Patient to report 0/10 pain when donning bra.    Time  8    Period  Weeks    Status  On-going            Plan - 02/27/18 1407    Clinical Impression Statement  Pt. reporting increased R shoulder pain over last few days however notes she has been vacuuming and performing more reaching  activities for items recently.  Pt. encouraged to continue to be mindful of reducing strain on R shoulder.  Session focusing on review of previous HEP and encouragement for pt. to perform these daily with pt. requiring cueing for multiple wand ROM activities in session for proper technique.  Pt. tends to guard at R shoulder and perform AAROM with full gravity resistance and without opposite UE assistance requiring cueing to correct.  Previous HEP re-issue to pt. with focus on wand ROM activities and addition of supine flexion AAROM for pt. to perform at home.  Will monitor response and continue to progress in future visits.    PT Treatment/Interventions  ADLs/Self Care Home Management;Cryotherapy;Electrical Stimulation;Iontophoresis 4mg /ml Dexamethasone;Moist Heat;Ultrasound;Gait training;Stair training;Functional mobility training;Balance training;Neuromuscular re-education;Therapeutic activities;Therapeutic exercise;Patient/family education;Manual techniques;Scar mobilization;Passive range of motion;Dry needling;Energy conservation;Taping;Vasopneumatic Device;Splinting    Consulted and Agree with Plan of Care  Patient       Patient will benefit from skilled therapeutic intervention in order to improve the following deficits and impairments:  Hypomobility, Decreased activity tolerance, Decreased strength, Impaired UE functional use, Pain, Decreased mobility, Decreased balance, Decreased range of motion, Impaired flexibility, Postural dysfunction  Visit Diagnosis: Acute pain of right shoulder  Stiffness of right shoulder, not elsewhere classified  Muscle weakness (generalized)  Other symptoms and signs involving the musculoskeletal system     Problem List Patient Active Problem List   Diagnosis Date Noted  . Anterior shoulder dislocation 09/07/2014  . Hyperlipidemia     11/06/2014, Kermit Balo 02/27/18 6:32 PM   [facility] [address]  [address] [place], [place], [place] Phone: [phone]   Fax:  [phone]  Name: Kayla Case MRN: [name] Date of Birth: 1946/06/09

## 2018-03-02 ENCOUNTER — Ambulatory Visit: Payer: Medicare Other | Admitting: Physical Therapy

## 2018-03-02 ENCOUNTER — Encounter: Payer: Self-pay | Admitting: Physical Therapy

## 2018-03-02 DIAGNOSIS — M25611 Stiffness of right shoulder, not elsewhere classified: Secondary | ICD-10-CM

## 2018-03-02 DIAGNOSIS — M6281 Muscle weakness (generalized): Secondary | ICD-10-CM

## 2018-03-02 DIAGNOSIS — M25511 Pain in right shoulder: Secondary | ICD-10-CM

## 2018-03-02 DIAGNOSIS — R29898 Other symptoms and signs involving the musculoskeletal system: Secondary | ICD-10-CM

## 2018-03-02 NOTE — Therapy (Signed)
[facility] [address]  [address] [place], [place], [address] Phone: [phone]   Fax:  [phone]  Physical Therapy Treatment  Patient Details  Name: Kayla Case MRN: [id number] Date of Birth: 1945-09-28 Referring Provider: Jene Every   Encounter Date: 03/02/2018  PT End of Session - 03/02/18 1452    Visit Number  8    Number of Visits  17    Date for PT Re-Evaluation  03/15/18    Authorization Type  Medicare + Federal BCBS- VL 75    PT Start Time  1357    PT Stop Time  1456    PT Time Calculation (min)  59 min    Activity Tolerance  Patient tolerated treatment well;Patient limited by pain    Behavior During Therapy  [facility] for tasks assessed/performed       Past Medical History:  Diagnosis Date  . Anxiety   . Depression   . Diabetes mellitus without complication (HCC)   . Hyperlipidemia   . Rheumatoid arthritis (HCC)   . Thyroid disease     Past Surgical History:  Procedure Laterality Date  . ABDOMINAL HYSTERECTOMY    . SHOULDER CLOSED REDUCTION Right 09/07/2014   Procedure: CLOSED REDUCTION SHOULDER;  Surgeon: Nadara Mustard, MD;  Location: [facility] OR;  Service: Orthopedics;  Laterality: Right;    There were no vitals filed for this visit.  Subjective Assessment - 03/02/18 1359    Subjective  Patient reports R shoulder has been okay since last session. Ordered pulley for home use. "Losing 2 weeks of PT has affected me."     Pertinent History  RA, thyroid problems, DM, hx of falls    Diagnostic tests  None since surgery    Patient Stated Goals  to get this arm moving and get strength and mobility back, put bra on    Currently in Pain?  Yes    Pain Score  3     Pain Location  Shoulder    Pain Orientation  Right    Pain Descriptors / Indicators  Discomfort    Pain Type  Acute pain;Surgical pain                       [facility] Adult PT Treatment/Exercise - 03/02/18 0001      Elbow Exercises   Elbow Flexion  Strengthening;Right;10 reps;Standing yellow TB; 2x10    Elbow Extension  Strengthening;Both;10 reps;Standing;Theraband yellow TB      Shoulder Exercises: Supine   Protraction  Both;AAROM;10 reps;Limitations    Protraction Limitations  wand    External Rotation  AAROM;Both;5 reps;Limitations    External Rotation Limitations  wand; 90/90    Flexion  AAROM;Both;5 reps;Limitations    Flexion Limitations  wand    ABduction  AAROM;Both;5 reps;Limitations    ABduction Limitations  wand; heavy VC/TCs for form      Shoulder Exercises: Prone   Other Prone Exercises  R UE row and midrow; 10x each VC/TCs to correct form      Shoulder Exercises: Sidelying   External Rotation  Right;Strengthening;10 reps;Limitations    External Rotation Limitations  90/90 with dowel under elbow      Shoulder Exercises: Standing   Other Standing Exercises  Walking ball up wall into flexion; 10x patient c/o pain, advised not to roll so high      Shoulder Exercises: ROM/Strengthening   UBE (Upper Arm Bike)  L1 3/3  Vasopneumatic   Number Minutes Vasopneumatic   15 minutes    Vasopnuematic Location   Shoulder R    Vasopneumatic Pressure  Medium    Vasopneumatic Temperature   Coldest      Manual Therapy   Manual Therapy  Soft tissue mobilization    Joint Mobilization  --    Soft tissue mobilization  R UT and deltoid- TTP in UT    Passive ROM  R shoulder flex, ABD, IR, ER to pt tolerance               PT Short Term Goals - 02/09/18 1544      PT SHORT TERM GOAL #1   Title  Patient to be independent with initial HEP.    Time  4    Period  Weeks    Status  Achieved        PT Long Term Goals - 01/31/18 1126      PT LONG TERM GOAL #1   Title  Patient to be independent with advanced HEP.    Time  8    Period  Weeks    Status  On-going      PT LONG TERM GOAL #2   Title  Patient to demonstrate [facility] R shoulder AROM in order to perform ADLs at home.    Time  8    Period   Weeks    Status  On-going      PT LONG TERM GOAL #3   Title  Patient to demonstrate >=4+/5 R UE strength in order to be able to walk dog on leash with dominant hand.     Time  8    Period  Weeks    Status  On-going      PT LONG TERM GOAL #4   Title  Patient to report 0/10 pain when donning bra.    Time  8    Period  Weeks    Status  On-going            Plan - 03/02/18 1452    Clinical Impression Statement  Patient arrived to session with report that R shoulder was slightly more sore after last session, noting that skipping 2 weeks of PT affected her shoulder. Tolerated R shoulder PROM in all planes and STM to R UT and deltoid- TTP in UT this session. Patient performed R shoulder AAROM- difficulty performing into abduction; patient given heavy VC/TCs to correct form. Able to perform wand serratus punches and SL ER with good form and decrease in reported pain. Patient also able to perform bicep curls/tricep pulldown with light banded resistance without c/o pain. Attempted rolling ball on wall into flexion- patient advised not to push into pain but with c/o R shoulder pain. Patient still with Intermittent catching sensation in R deltoid with ther-ex this session. Received Gameready to R shoulder at end of session to ease symptoms- decrease in reported symptoms.     PT Treatment/Interventions  ADLs/Self Care Home Management;Cryotherapy;Electrical Stimulation;Iontophoresis 4mg /ml Dexamethasone;Moist Heat;Ultrasound;Gait training;Stair training;Functional mobility training;Balance training;Neuromuscular re-education;Therapeutic activities;Therapeutic exercise;Patient/family education;Manual techniques;Scar mobilization;Passive range of motion;Dry needling;Energy conservation;Taping;Vasopneumatic Device;Splinting    PT Next Visit Plan  Reassess ball on wall AAROM flexion    Consulted and Agree with Plan of Care  Patient       Patient will benefit from skilled therapeutic intervention in order  to improve the following deficits and impairments:  Hypomobility, Decreased activity tolerance, Decreased strength, Impaired UE functional use, Pain, Decreased mobility, Decreased balance, Decreased  range of motion, Impaired flexibility, Postural dysfunction  Visit Diagnosis: Acute pain of right shoulder  Stiffness of right shoulder, not elsewhere classified  Muscle weakness (generalized)  Other symptoms and signs involving the musculoskeletal system     Problem List Patient Active Problem List   Diagnosis Date Noted  . Anterior shoulder dislocation 09/07/2014  . Hyperlipidemia     Anette Guarneri, PT, DPT 03/02/18 2:58 PM  [facility] [address]  [address] [place], [place], [address] Phone: [phone]   Fax:  [phone]  Name: Kayla Case MRN: [id number] Date of Birth: November 28, 1945

## 2018-03-07 ENCOUNTER — Encounter: Payer: Self-pay | Admitting: Physical Therapy

## 2018-03-07 ENCOUNTER — Ambulatory Visit: Payer: Medicare Other | Attending: Specialist | Admitting: Physical Therapy

## 2018-03-07 DIAGNOSIS — R29898 Other symptoms and signs involving the musculoskeletal system: Secondary | ICD-10-CM | POA: Insufficient documentation

## 2018-03-07 DIAGNOSIS — M25611 Stiffness of right shoulder, not elsewhere classified: Secondary | ICD-10-CM | POA: Insufficient documentation

## 2018-03-07 DIAGNOSIS — M25511 Pain in right shoulder: Secondary | ICD-10-CM | POA: Diagnosis not present

## 2018-03-07 DIAGNOSIS — M6281 Muscle weakness (generalized): Secondary | ICD-10-CM | POA: Insufficient documentation

## 2018-03-07 NOTE — Therapy (Addendum)
[facility] [address]  [address] [place], [place], [address] Phone: [phone]   Fax:  [phone]  Physical Therapy Progress Note  Patient Details  Name: Kayla Case MRN: [id number] Date of Birth: [birth year] Referring Provider: Jene Every   Progress Note Reporting Period 01/18/18 to 03/07/18  See note below for Objective Data and Assessment of Progress/Goals.    Encounter Date: 03/07/2018  PT End of Session - 03/07/18 1401    Visit Number  9    Number of Visits  21    Date for PT Re-Evaluation  04/18/18    Authorization Type  Medicare + Federal BCBS- VL 75    PT Start Time  1313    PT Stop Time  1410    PT Time Calculation (min)  57 min    Activity Tolerance  Patient tolerated treatment well;Patient limited by pain    Behavior During Therapy  [facility] for tasks assessed/performed       Past Medical History:  Diagnosis Date  . Anxiety   . Depression   . Diabetes mellitus without complication (HCC)   . Hyperlipidemia   . Rheumatoid arthritis (HCC)   . Thyroid disease     Past Surgical History:  Procedure Laterality Date  . ABDOMINAL HYSTERECTOMY    . SHOULDER CLOSED REDUCTION Right 09/07/2014   Procedure: CLOSED REDUCTION SHOULDER;  Surgeon: Nadara Mustard, MD;  [facility]: [facility] OR;  Service: Orthopedics;  Laterality: Right;    There were no vitals filed for this visit.  Subjective Assessment - 03/07/18 1312    Subjective  Patient reports she got her pulley today. Reports 50% improvement in R shoulder since inital eval. Still cannot use her R hand to rotate the wheel when backing into a spot, cannot vaccum, doing her hair, putting shirt on.     Pertinent History  RA, thyroid problems, DM, hx of falls    Diagnostic tests  None since surgery    Patient Stated Goals  to get this arm moving and get strength and mobility back, put bra on    Currently in Pain?  No/denies         [facility] PT Assessment - 03/07/18  0001      PROM   PROM Assessment Site  Shoulder    Right/Left Shoulder  Right    Right Shoulder Flexion  119 Degrees    Right Shoulder ABduction  104 Degrees    Right Shoulder Internal Rotation  73 Degrees    Right Shoulder External Rotation  48 Degrees                   [facility] Adult PT Treatment/Exercise - 03/07/18 0001      Shoulder Exercises: Supine   Flexion  AAROM;Both;5 reps;Limitations    Flexion Limitations  attempted with wand- could not toleratre this session; have R deltoid  pain      Shoulder Exercises: Seated   External Rotation  AAROM;Both;10 reps;Limitations    External Rotation Limitations  wand; to tolerance      Shoulder Exercises: Prone   Other Prone Exercises  R UE row; 15x    Other Prone Exercises  extension with scapular retraction; 15x      Shoulder Exercises: Sidelying   External Rotation  Right;Strengthening;Limitations;15 reps    External Rotation Limitations  90/90 with dowel under elbow    ABduction  AROM;Right;10 reps;Limitations    ABduction Limitations  to tolerance with thumb up  Shoulder Exercises: Pulleys   Flexion  3 minutes      Shoulder Exercises: ROM/Strengthening   UBE (Upper Arm Bike)  L1 3/3      Vasopneumatic   Number Minutes Vasopneumatic   15 minutes    Vasopnuematic Location   Shoulder R    Vasopneumatic Pressure  Low    Vasopneumatic Temperature   Coldest      Manual Therapy   Manual Therapy  Soft tissue mobilization;Passive ROM    Soft tissue mobilization  R lateral and anterior deltoid- TTP and mild soft tissue restriction    Passive ROM  R shoulder flex, ABD, IR, ER to pt tolerance             PT Education - 03/07/18 1400    Education provided  Yes    Education Details  Addition to [employer]) Educated  Patient    Methods  Explanation;Demonstration;Tactile cues;Verbal cues;Handout    Comprehension  Returned demonstration;Verbalized understanding       PT Short Term Goals - 03/07/18  1318      PT SHORT TERM GOAL #1   Title  Patient to be independent with initial HEP.    Time  4    Period  Weeks    Status  Achieved        PT Long Term Goals - 03/07/18 1318      PT LONG TERM GOAL #1   Title  Patient to be independent with advanced HEP.    Time  6    Period  Weeks    Status  On-going reports compliance with HEP    Target Date  04/18/18      PT LONG TERM GOAL #2   Title  Patient to demonstrate [facility] R shoulder AROM in order to perform ADLs at home.    Time  6    Period  Weeks    Status  On-going    Target Date  04/18/18      PT LONG TERM GOAL #3   Title  Patient to demonstrate >=4+/5 R UE strength in order to be able to walk dog on leash with dominant hand.     Time  6    Period  Weeks    Status  On-going NT d/t surgical precautions this date    Target Date  04/18/18      PT LONG TERM GOAL #4   Title  Patient to report 0/10 pain when donning bra.    Time  6    Period  Weeks    Status  On-going pain up to 10/10 with donning bra at this time    Target Date  04/18/18            Plan - 03/07/18 1401    Clinical Impression Statement  Patient arrived to session with no new complaints. Notes 50% improvement in R shoulder since initial eval but reports she is disappointed that she is not further along, although acknowledges that she is older now than her last RTC repair and understands that healing will take a bit longer this time around. Updated goals- patient has improved R shoulder PROM in all planes with most limitation in flexion and ER. Patient now able to don undergarments but reports that pain can go up to 10/10 at times- cites that she lives alone and does not have anyone to help her with this activity. Advised patient to try wearing garments that do not have a clasp  to avoid end range IR. Patient tolerated STM and PROM to R shoulder- noted soft tissue restriction and tenderness to R deltoid. Unable to perform AAROM flexion with wand this date d/t  deltoid pain. Opted for flexion with pulley which was better tolerated. Patient able to perform sidelying ER and abduction with no complaints. Added these exercises to HEP. Received Gameready to R shoulder at end of session to ease post-exercise soreness. Patient has shown improvements in R shoulder ROM, plan to continue for 2x/week for 6 weeks to address strength and functional activities to promote independence at home.     PT Treatment/Interventions  ADLs/Self Care Home Management;Cryotherapy;Electrical Stimulation;Iontophoresis 4mg /ml Dexamethasone;Moist Heat;Ultrasound;Gait training;Stair training;Functional mobility training;Balance training;Neuromuscular re-education;Therapeutic activities;Therapeutic exercise;Patient/family education;Manual techniques;Scar mobilization;Passive range of motion;Dry needling;Energy conservation;Taping;Vasopneumatic Device;Splinting    PT Next Visit Plan  reassess supine flexion AROM/AAROM    Consulted and Agree with Plan of Care  Patient       Patient will benefit from skilled therapeutic intervention in order to improve the following deficits and impairments:  Hypomobility, Decreased activity tolerance, Decreased strength, Impaired UE functional use, Pain, Decreased mobility, Decreased balance, Decreased range of motion, Impaired flexibility, Postural dysfunction  Visit Diagnosis: Acute pain of right shoulder  Stiffness of right shoulder, not elsewhere classified  Muscle weakness (generalized)  Other symptoms and signs involving the musculoskeletal system     Problem List Patient Active Problem List   Diagnosis Date Noted  . Anterior shoulder dislocation 09/07/2014  . Hyperlipidemia     Anette Guarneri, PT, DPT 03/13/18 12:31 PM   [facility] [address]  [address] [place], [place], [address] Phone: [phone]   Fax:  [phone]  Name: Kayla Case MRN: [id number] Date of  Birth: Jan 31, 1946

## 2018-03-14 ENCOUNTER — Ambulatory Visit: Payer: Medicare Other

## 2018-03-14 DIAGNOSIS — M6281 Muscle weakness (generalized): Secondary | ICD-10-CM

## 2018-03-14 DIAGNOSIS — M25611 Stiffness of right shoulder, not elsewhere classified: Secondary | ICD-10-CM

## 2018-03-14 DIAGNOSIS — M25511 Pain in right shoulder: Secondary | ICD-10-CM | POA: Diagnosis not present

## 2018-03-14 DIAGNOSIS — R29898 Other symptoms and signs involving the musculoskeletal system: Secondary | ICD-10-CM

## 2018-03-14 NOTE — Therapy (Signed)
[facility] [address]  [address] [place], [place], [address] Phone: [phone]   Fax:  [phone]  Physical Therapy Treatment  Patient Details  Name: Kayla Case MRN: [id number] Date of Birth: 05-19-1946 Referring Provider: Jene Every   Encounter Date: 03/14/2018  PT End of Session - 03/14/18 1620    Visit Number  10    Number of Visits  21    Date for PT Re-Evaluation  04/18/18    Authorization Type  Medicare + Federal BCBS- VL 75    PT Start Time  1616    PT Stop Time  1708    PT Time Calculation (min)  52 min    Activity Tolerance  Patient tolerated treatment well;Patient limited by pain    Behavior During Therapy  [facility] for tasks assessed/performed       Past Medical History:  Diagnosis Date  . Anxiety   . Depression   . Diabetes mellitus without complication (HCC)   . Hyperlipidemia   . Rheumatoid arthritis (HCC)   . Thyroid disease     Past Surgical History:  Procedure Laterality Date  . ABDOMINAL HYSTERECTOMY    . SHOULDER CLOSED REDUCTION Right 09/07/2014   Procedure: CLOSED REDUCTION SHOULDER;  Surgeon: Nadara Mustard, MD;  Location: [facility] OR;  Service: Orthopedics;  Laterality: Right;    There were no vitals filed for this visit.  Subjective Assessment - 03/14/18 1619    Subjective  Pt. noting she mopped floor yesterday and had increased pain.      Pertinent History  RA, thyroid problems, DM, hx of falls    Diagnostic tests  None since surgery    Patient Stated Goals  to get this arm moving and get strength and mobility back, put bra on    Currently in Pain?  No/denies    Pain Score  0-No pain    Multiple Pain Sites  No         [facility] PT Assessment - 03/14/18 1638      Observation/Other Assessments   Focus on Therapeutic Outcomes (FOTO)   48% (52% limitation)      PROM   PROM Assessment Site  Shoulder    Right/Left Shoulder  Right    Right Shoulder Flexion  130 Degrees    Right Shoulder  ABduction  130 Degrees    Right Shoulder Internal Rotation  83 Degrees    Right Shoulder External Rotation  70 Degrees                   [facility] Adult PT Treatment/Exercise - 03/14/18 1644      Shoulder Exercises: Supine   Protraction  Right;15 reps Some complaint of pain however good tolerance     External Rotation  AAROM;Both;Limitations;15 reps    External Rotation Limitations  wand 90/90    Other Supine Exercises  R shoulder circles x 15 reps CW, CCW without assistance  Some complaint of pain however good tolerance       Shoulder Exercises: Standing   Flexion  Right;AAROM;10 reps    Flexion Limitations  assistance from therapist working on avoiding excessive scap. elevation  with mirror feedback       Shoulder Exercises: ROM/Strengthening   UBE (Upper Arm Bike)  L1 3/3    Wall Wash  R flexion wall wash x 10 reps with some L UE assistance       Vasopneumatic   Number Minutes Vasopneumatic   8  minutes pt. asking to terminate early    Vasopnuematic Location   Shoulder    Vasopneumatic Pressure  Low    Vasopneumatic Temperature   Coldest      Manual Therapy   Manual Therapy  Soft tissue mobilization;Passive ROM    Soft tissue mobilization  R posterior/inferior shoulder in area of tenderness; increased tone     Passive ROM  R shoulder flex, ABD, IR, ER to pt tolerance               PT Short Term Goals - 03/07/18 1318      PT SHORT TERM GOAL #1   Title  Patient to be independent with initial HEP.    Time  4    Period  Weeks    Status  Achieved        PT Long Term Goals - 03/07/18 1318      PT LONG TERM GOAL #1   Title  Patient to be independent with advanced HEP.    Time  6    Period  Weeks    Status  On-going reports compliance with HEP    Target Date  04/18/18      PT LONG TERM GOAL #2   Title  Patient to demonstrate [facility] R shoulder AROM in order to perform ADLs at home.    Time  6    Period  Weeks    Status  On-going    Target Date   04/18/18      PT LONG TERM GOAL #3   Title  Patient to demonstrate >=4+/5 R UE strength in order to be able to walk dog on leash with dominant hand.     Time  6    Period  Weeks    Status  On-going NT d/t surgical precautions this date    Target Date  04/18/18      PT LONG TERM GOAL #4   Title  Patient to report 0/10 pain when donning bra.    Time  6    Period  Weeks    Status  On-going pain up to 10/10 with donning bra at this time    Target Date  04/18/18            Plan - 03/14/18 1631    Clinical Impression Statement  Pt. noting she mopped floor using R UE yesterday and has felt increased pain all of yesterday, which improved today.  Much improved PROM today with less muscle guarding following prolonged PROM holds.  Tolerated all AAROM and AROM activities well today.  Ended with ice/compression to R shoulder to decrease post-exercise swelling and pain.      PT Treatment/Interventions  ADLs/Self Care Home Management;Cryotherapy;Electrical Stimulation;Iontophoresis 4mg /ml Dexamethasone;Moist Heat;Ultrasound;Gait training;Stair training;Functional mobility training;Balance training;Neuromuscular re-education;Therapeutic activities;Therapeutic exercise;Patient/family education;Manual techniques;Scar mobilization;Passive range of motion;Dry needling;Energy conservation;Taping;Vasopneumatic Device;Splinting    Consulted and Agree with Plan of Care  Patient       Patient will benefit from skilled therapeutic intervention in order to improve the following deficits and impairments:  Hypomobility, Decreased activity tolerance, Decreased strength, Impaired UE functional use, Pain, Decreased mobility, Decreased balance, Decreased range of motion, Impaired flexibility, Postural dysfunction  Visit Diagnosis: Acute pain of right shoulder  Stiffness of right shoulder, not elsewhere classified  Muscle weakness (generalized)  Other symptoms and signs involving the musculoskeletal  system     Problem List Patient Active Problem List   Diagnosis Date Noted  . Anterior shoulder dislocation 09/07/2014  . Hyperlipidemia  Kermit Balo, PTA 03/14/18 6:17 PM   [facility] [address]  [address] [place], [place], [address] Phone: [phone]   Fax:  [phone]  Name: Kayla Case MRN: [id number] Date of Birth: 29-Jun-1946

## 2018-03-20 ENCOUNTER — Ambulatory Visit: Payer: Medicare Other

## 2018-03-20 DIAGNOSIS — M6281 Muscle weakness (generalized): Secondary | ICD-10-CM

## 2018-03-20 DIAGNOSIS — M25511 Pain in right shoulder: Secondary | ICD-10-CM | POA: Diagnosis not present

## 2018-03-20 DIAGNOSIS — R29898 Other symptoms and signs involving the musculoskeletal system: Secondary | ICD-10-CM

## 2018-03-20 DIAGNOSIS — M25611 Stiffness of right shoulder, not elsewhere classified: Secondary | ICD-10-CM

## 2018-03-20 NOTE — Therapy (Signed)
[facility] Outpatient Rehabilitation [facility] [address]  [address] [place], [place], [address] Phone: [phone]   Fax:  [phone]  Physical Therapy Treatment  Patient Details  Name: Kayla Case MRN: [id number] Date of Birth: 1945/10/21 Referring Provider: Jene Every   Encounter Date: 03/20/2018  PT End of Session - 03/20/18 1314    Visit Number  11    Number of Visits  21    Date for PT Re-Evaluation  04/18/18    Authorization Type  Medicare + Federal BCBS- VL 75    PT Start Time  1310    PT Stop Time  1355    PT Time Calculation (min)  45 min    Activity Tolerance  Patient tolerated treatment well;Patient limited by pain    Behavior During Therapy  [facility] for tasks assessed/performed       Past Medical History:  Diagnosis Date  . Anxiety   . Depression   . Diabetes mellitus without complication (HCC)   . Hyperlipidemia   . Rheumatoid arthritis (HCC)   . Thyroid disease     Past Surgical History:  Procedure Laterality Date  . ABDOMINAL HYSTERECTOMY    . SHOULDER CLOSED REDUCTION Right 09/07/2014   Procedure: CLOSED REDUCTION SHOULDER;  Surgeon: Nadara Mustard, MD;  Location: [facility] OR;  Service: Orthopedics;  Laterality: Right;    There were no vitals filed for this visit.  Subjective Assessment - 03/20/18 1313    Subjective  Doing well today.      Pertinent History  RA, thyroid problems, DM, hx of falls    Diagnostic tests  None since surgery    Patient Stated Goals  to get this arm moving and get strength and mobility back, put bra on    Currently in Pain?  No/denies    Pain Score  0-No pain    Multiple Pain Sites  No                       [facility] Adult PT Treatment/Exercise - 03/20/18 1321      Shoulder Exercises: Supine   Protraction  Right;15 reps    Protraction Limitations  1       Shoulder Exercises: Standing   External Rotation  Right;10 reps;Theraband;Strengthening    Theraband Level (Shoulder External  Rotation)  Level 1 (Yellow)    Internal Rotation  Right;10 reps;Strengthening;Theraband    Theraband Level (Shoulder Internal Rotation)  Level 1 (Yellow)    Flexion  Right;10 reps;AAROM    Flexion Limitations  orange p-ball wall roll some L UE assistance to R elbow     Extension  10 reps;Both;Theraband    Theraband Level (Shoulder Extension)  Level 2 (Red)    Extension Limitations  cues for scapular squeeze     Row  15 reps;Strengthening;Theraband    Theraband Level (Shoulder Row)  Level 2 (Red)    Other Standing Exercises  B shoulder flexion with assistance from therapist for R UE flexion between ROM arc 60-100 dg to improve eccentric control in this range x 10 reps at wall       Shoulder Exercises: ROM/Strengthening   UBE (Upper Arm Bike)  L2.5, 3/3    Cybex Row  15 reps    Cybex Row Limitations  10#; Cues required for scapular retraction       Shoulder Exercises: Stretch   Corner Stretch  2 reps;30 seconds    Corner Stretch Limitations  low version  Manual Therapy   Manual Therapy  Passive ROM    Passive ROM  R shoulder flex, ABD, IR, ER to pt tolerance             PT Education - 03/20/18 1506    Education provided  Yes    Education Details  HEP update     Person(s) Educated  Patient    Methods  Explanation;Demonstration;Verbal cues;Handout    Comprehension  Verbalized understanding;Returned demonstration;Verbal cues required;Need further instruction       PT Short Term Goals - 03/07/18 1318      PT SHORT TERM GOAL #1   Title  Patient to be independent with initial HEP.    Time  4    Period  Weeks    Status  Achieved        PT Long Term Goals - 03/07/18 1318      PT LONG TERM GOAL #1   Title  Patient to be independent with advanced HEP.    Time  6    Period  Weeks    Status  On-going reports compliance with HEP    Target Date  04/18/18      PT LONG TERM GOAL #2   Title  Patient to demonstrate [facility] R shoulder AROM in order to perform ADLs at home.     Time  6    Period  Weeks    Status  On-going    Target Date  04/18/18      PT LONG TERM GOAL #3   Title  Patient to demonstrate >=4+/5 R UE strength in order to be able to walk dog on leash with dominant hand.     Time  6    Period  Weeks    Status  On-going NT d/t surgical precautions this date    Target Date  04/18/18      PT LONG TERM GOAL #4   Title  Patient to report 0/10 pain when donning bra.    Time  6    Period  Weeks    Status  On-going pain up to 10/10 with donning bra at this time    Target Date  04/18/18            Plan - 03/20/18 1315    Clinical Impression Statement  Pt. noted R shoulder soreness following last session, which lasted two days then subsided.  Renea Ee with improved tolerance for RTC/scapular strengthening activities in today's session per protocol.  Tolerated addition of L shoulder yellow TB resisted ER/IR, red TB row, machine row, and conservative wall pushup well today.  Ended session pain free thus modalities deferred.     PT Treatment/Interventions  ADLs/Self Care Home Management;Cryotherapy;Electrical Stimulation;Iontophoresis 4mg /ml Dexamethasone;Moist Heat;Ultrasound;Gait training;Stair training;Functional mobility training;Balance training;Neuromuscular re-education;Therapeutic activities;Therapeutic exercise;Patient/family education;Manual techniques;Scar mobilization;Passive range of motion;Dry needling;Energy conservation;Taping;Vasopneumatic Device;Splinting    Consulted and Agree with Plan of Care  Patient       Patient will benefit from skilled therapeutic intervention in order to improve the following deficits and impairments:  Hypomobility, Decreased activity tolerance, Decreased strength, Impaired UE functional use, Pain, Decreased mobility, Decreased balance, Decreased range of motion, Impaired flexibility, Postural dysfunction  Visit Diagnosis: Acute pain of right shoulder  Stiffness of right shoulder, not elsewhere  classified  Muscle weakness (generalized)  Other symptoms and signs involving the musculoskeletal system     Problem List Patient Active Problem List   Diagnosis Date Noted  . Anterior shoulder dislocation 09/07/2014  . Hyperlipidemia  Kermit Balo, PTA 03/20/18 3:29 PM   [facility] [address]  [address] [place], [place], [address] Phone: [phone]   Fax:  [phone]  Name: Kayla Case MRN: [id number] Date of Birth: 28-Oct-1945

## 2018-03-22 ENCOUNTER — Ambulatory Visit: Payer: Medicare Other | Admitting: Physical Therapy

## 2018-03-22 ENCOUNTER — Encounter (HOSPITAL_BASED_OUTPATIENT_CLINIC_OR_DEPARTMENT_OTHER): Payer: Self-pay | Admitting: *Deleted

## 2018-03-22 ENCOUNTER — Emergency Department (HOSPITAL_BASED_OUTPATIENT_CLINIC_OR_DEPARTMENT_OTHER)
Admission: EM | Admit: 2018-03-22 | Discharge: 2018-03-22 | Disposition: A | Discharge status: Home / Self Care | Payer: Medicare Other | Attending: Emergency Medicine | Admitting: Emergency Medicine

## 2018-03-22 ENCOUNTER — Encounter: Payer: Self-pay | Admitting: Physical Therapy

## 2018-03-22 ENCOUNTER — Other Ambulatory Visit: Payer: Self-pay

## 2018-03-22 ENCOUNTER — Emergency Department (HOSPITAL_BASED_OUTPATIENT_CLINIC_OR_DEPARTMENT_OTHER): Payer: Medicare Other

## 2018-03-22 DIAGNOSIS — F419 Anxiety disorder, unspecified: Secondary | ICD-10-CM | POA: Insufficient documentation

## 2018-03-22 DIAGNOSIS — F329 Major depressive disorder, single episode, unspecified: Secondary | ICD-10-CM | POA: Insufficient documentation

## 2018-03-22 DIAGNOSIS — W010XXA Fall on same level from slipping, tripping and stumbling without subsequent striking against object, initial encounter: Secondary | ICD-10-CM | POA: Diagnosis not present

## 2018-03-22 DIAGNOSIS — E119 Type 2 diabetes mellitus without complications: Secondary | ICD-10-CM | POA: Diagnosis not present

## 2018-03-22 DIAGNOSIS — Y929 Unspecified place or not applicable: Secondary | ICD-10-CM | POA: Diagnosis not present

## 2018-03-22 DIAGNOSIS — M6281 Muscle weakness (generalized): Secondary | ICD-10-CM

## 2018-03-22 DIAGNOSIS — Z79899 Other long term (current) drug therapy: Secondary | ICD-10-CM | POA: Insufficient documentation

## 2018-03-22 DIAGNOSIS — S6991XA Unspecified injury of right wrist, hand and finger(s), initial encounter: Secondary | ICD-10-CM | POA: Diagnosis present

## 2018-03-22 DIAGNOSIS — Y9389 Activity, other specified: Secondary | ICD-10-CM | POA: Insufficient documentation

## 2018-03-22 DIAGNOSIS — R29898 Other symptoms and signs involving the musculoskeletal system: Secondary | ICD-10-CM

## 2018-03-22 DIAGNOSIS — M25611 Stiffness of right shoulder, not elsewhere classified: Secondary | ICD-10-CM

## 2018-03-22 DIAGNOSIS — S62642A Nondisplaced fracture of proximal phalanx of right middle finger, initial encounter for closed fracture: Secondary | ICD-10-CM | POA: Diagnosis not present

## 2018-03-22 DIAGNOSIS — Z7982 Long term (current) use of aspirin: Secondary | ICD-10-CM | POA: Diagnosis not present

## 2018-03-22 DIAGNOSIS — M25511 Pain in right shoulder: Secondary | ICD-10-CM | POA: Diagnosis not present

## 2018-03-22 DIAGNOSIS — Y998 Other external cause status: Secondary | ICD-10-CM | POA: Insufficient documentation

## 2018-03-22 DIAGNOSIS — Z7984 Long term (current) use of oral hypoglycemic drugs: Secondary | ICD-10-CM | POA: Insufficient documentation

## 2018-03-22 MED ORDER — IBUPROFEN 400 MG PO TABS
400.0000 mg | ORAL_TABLET | Freq: Once | ORAL | Status: AC
  Administered 2018-03-22: 400 mg via ORAL
  Filled 2018-03-22: qty 1

## 2018-03-22 NOTE — ED Triage Notes (Signed)
Pt c/o right hand injury x 2 days ago  

## 2018-03-22 NOTE — Therapy (Signed)
[facility] Outpatient Rehabilitation [facility] [address]  [address] [place], [place], [address] Phone: [phone]   Fax:  [phone]  Physical Therapy Treatment  Patient Details  Name: Kayla Case MRN: [id number] Date of Birth: May 04, 1946 Referring Provider: Jene Every   Encounter Date: 03/22/2018  PT End of Session - 03/22/18 1454    Visit Number  12    Number of Visits  21    Date for PT Re-Evaluation  04/18/18    Authorization Type  Medicare + Federal BCBS- VL 75    PT Start Time  1301    PT Stop Time  1353    PT Time Calculation (min)  52 min    Activity Tolerance  Patient tolerated treatment well;Patient limited by pain    Behavior During Therapy  [facility] for tasks assessed/performed       Past Medical History:  Diagnosis Date  . Anxiety   . Depression   . Diabetes mellitus without complication (HCC)   . Hyperlipidemia   . Rheumatoid arthritis (HCC)   . Thyroid disease     Past Surgical History:  Procedure Laterality Date  . ABDOMINAL HYSTERECTOMY    . SHOULDER CLOSED REDUCTION Right 09/07/2014   Procedure: CLOSED REDUCTION SHOULDER;  Surgeon: Nadara Mustard, MD;  Location: [facility] OR;  Service: Orthopedics;  Laterality: Right;    There were no vitals filed for this visit.  Subjective Assessment - 03/22/18 1305    Subjective  Patient reports on Monday she fell trying to close her blinds. Fell on bottom and hurt her R middle finger. Can't move her finger now and very painful. Did not hurt her shoulder. Plans to go to ED after this appointment for an xray.    Pertinent History  RA, thyroid problems, DM, hx of falls    Diagnostic tests  None since surgery    Patient Stated Goals  to get this arm moving and get strength and mobility back, put bra on    Currently in Pain?  Yes    Multiple Pain Sites  Yes    Pain Score  10    Pain Location  Finger (Comment which one) 3rd digit    Pain Orientation  Right    Pain Descriptors / Indicators   Throbbing    Pain Type  Acute pain                       [facility] Adult PT Treatment/Exercise - 03/22/18 0001      Shoulder Exercises: Supine   Protraction  15 reps;Both    Protraction Limitations  1  good tolerance    Flexion  AROM;Right;10 reps;Limitations    Flexion Limitations  pt able to tolerate concentric flexion, assistance for eccentric lowering from PT      Shoulder Exercises: Prone   Other Prone Exercises  R UE row; 10x 2#    Other Prone Exercises  extension with scapular retraction; 10x 1# VCs to control eccentric lower to avoid R arm swinging      Shoulder Exercises: Sidelying   External Rotation  Right;Strengthening;Limitations;Weights;15 reps    External Rotation Weight (lbs)  1#    External Rotation Limitations  90/90 with dowel under elbow    ABduction  AROM;Right;10 reps;Limitations    ABduction Limitations  to tolerance with thumb up; TCs to avoid shoulder elevation      Shoulder Exercises: Standing   External Rotation  Right;10 reps;Theraband;Strengthening    Theraband  Level (Shoulder External Rotation)  Level 1 (Yellow)    Internal Rotation  Right;10 reps;Strengthening;Theraband    Theraband Level (Shoulder Internal Rotation)  Level 1 (Yellow)    Flexion  Right;10 reps;AAROM    Flexion Limitations  beach ball wall roll; VC/TCs to keep R shoulder depressed    Row  15 reps;Strengthening;Theraband;Limitations    Theraband Level (Shoulder Row)  Level 2 (Red)    Row Limitations  VCs to maintain bent elbow      Shoulder Exercises: ROM/Strengthening   UBE (Upper Arm Bike)  L2.5, 3/3      Modalities   Modalities  Cryotherapy      Cryotherapy   Number Minutes Cryotherapy  10 Minutes    Cryotherapy Location  Hand R 3rd digit    Type of Cryotherapy  Ice pack      Vasopneumatic   Number Minutes Vasopneumatic   13 minutes    Vasopnuematic Location   Shoulder R    Vasopneumatic Pressure  Low    Vasopneumatic Temperature   Coldest      Manual  Therapy   Manual Therapy  Soft tissue mobilization    Soft tissue mobilization  R deltoid, pec, bicep, tricep- no tenderness; mild soft tissue restriction in bicep               PT Short Term Goals - 03/07/18 1318      PT SHORT TERM GOAL #1   Title  Patient to be independent with initial HEP.    Time  4    Period  Weeks    Status  Achieved        PT Long Term Goals - 03/07/18 1318      PT LONG TERM GOAL #1   Title  Patient to be independent with advanced HEP.    Time  6    Period  Weeks    Status  On-going reports compliance with HEP    Target Date  04/18/18      PT LONG TERM GOAL #2   Title  Patient to demonstrate [facility] R shoulder AROM in order to perform ADLs at home.    Time  6    Period  Weeks    Status  On-going    Target Date  04/18/18      PT LONG TERM GOAL #3   Title  Patient to demonstrate >=4+/5 R UE strength in order to be able to walk dog on leash with dominant hand.     Time  6    Period  Weeks    Status  On-going NT d/t surgical precautions this date    Target Date  04/18/18      PT LONG TERM GOAL #4   Title  Patient to report 0/10 pain when donning bra.    Time  6    Period  Weeks    Status  On-going pain up to 10/10 with donning bra at this time    Target Date  04/18/18            Plan - 03/22/18 1455    Clinical Impression Statement  Patient arrived to session with report that she fell on Monday, trying to close her blinds and slipped on a cushion on the floor. R 3rd digit is now edematous and restricted in motion. Patient reports she will go to the ED downstairs after her PT appointment to get it checked out. Patient tolerated STM to R shoulder without tenderness. Able to  perform serratus punches and sidelying ER with weighted resistance, good form, and no c/o pain. Patient with difficulty tolerated supine flexion and sidelying abduction d/t pain with eccentric lowering- performed these exercises to patient's tolerance. Patient able to  perform R shoulder IR/ER with cues for form, but good tolerance. Reports she is doing these exercises at home. Patient reporting pain in finger but not limited by this pain during session and agreeable to continue with exercises. Received [place] to R shoulder and ice pack to R finger at end of session for pain relief. Normal integumentary response observed and mild decrease in R finger edema. Patient without R shoulder pain at session.    PT Treatment/Interventions  ADLs/Self Care Home Management;Cryotherapy;Electrical Stimulation;Iontophoresis 4mg /ml Dexamethasone;Moist Heat;Ultrasound;Gait training;Stair training;Functional mobility training;Balance training;Neuromuscular re-education;Therapeutic activities;Therapeutic exercise;Patient/family education;Manual techniques;Scar mobilization;Passive range of motion;Dry needling;Energy conservation;Taping;Vasopneumatic Device;Splinting    Consulted and Agree with Plan of Care  Patient       Patient will benefit from skilled therapeutic intervention in order to improve the following deficits and impairments:  Hypomobility, Decreased activity tolerance, Decreased strength, Impaired UE functional use, Pain, Decreased mobility, Decreased balance, Decreased range of motion, Impaired flexibility, Postural dysfunction  Visit Diagnosis: Acute pain of right shoulder  Stiffness of right shoulder, not elsewhere classified  Muscle weakness (generalized)  Other symptoms and signs involving the musculoskeletal system     Problem List Patient Active Problem List   Diagnosis Date Noted  . Anterior shoulder dislocation 09/07/2014  . Hyperlipidemia     Anette Guarneri, PT, DPT 03/22/18 3:02 PM   [facility] [address]  [address] [place], [place], [address] Phone: [phone]   Fax:  [phone]  Name: Kayla Case MRN: [id number] Date of Birth: 1946/07/23

## 2018-03-22 NOTE — ED Provider Notes (Signed)
[facility] EMERGENCY DEPARTMENT Provider Note   CSN: [id number] Arrival date & time: 03/22/18  1404     History   Chief Complaint Chief Complaint  Patient presents with  . Hand Injury    HPI Kayla Case is a 72 y.o. female.  HPI 72 year old female who presents to the emergency department complaints of pain at her PIP joint of her right middle finger after a fall and slip 2 days ago.  She was at physical therapy today and was recommended that she come to the ER for x-ray and evaluation.  She reports pain with range of motion of her PIP and her DIP joint on her right middle finger.  No other complaints.  No wrist or elbow pain.  Symptoms are mild in severity.   Past Medical History:  Diagnosis Date  . Anxiety   . Depression   . Diabetes mellitus without complication (HCC)   . Hyperlipidemia   . Rheumatoid arthritis (HCC)   . Thyroid disease     Patient Active Problem List   Diagnosis Date Noted  . Anterior shoulder dislocation 09/07/2014  . Hyperlipidemia     Past Surgical History:  Procedure Laterality Date  . ABDOMINAL HYSTERECTOMY    . SHOULDER CLOSED REDUCTION Right 09/07/2014   Procedure: CLOSED REDUCTION SHOULDER;  Surgeon: Nadara Mustard, MD;  Location: [facility] OR;  Service: Orthopedics;  Laterality: Right;     OB History   None      Home Medications    Prior to Admission medications   Medication Sig Start Date End Date Taking? Authorizing Provider  sitaGLIPtin (JANUVIA) 100 MG tablet Take 1 tablet by mouth daily. 01/22/18  Yes [provider]  aspirin 81 MG tablet Take 81 mg by mouth daily.    [provider]  DULoxetine (CYMBALTA) 60 MG capsule Take 60 mg by mouth daily.    [provider]  KLOR-CON M10 10 MEQ tablet Take 10 mEq by mouth daily. 02/06/18   [provider]  levothyroxine (SYNTHROID, LEVOTHROID) 25 MCG tablet Take 25 mcg by mouth daily before breakfast.    [provider]  LORazepam  (ATIVAN) 1 MG tablet Take 1 mg by mouth every 6 (six) hours as needed for anxiety.    [provider]  Lorcaserin HCl (BELVIQ) 10 MG TABS Take by mouth daily.    [provider]  losartan-hydrochlorothiazide (HYZAAR) 100-25 MG per tablet Take 1 tablet by mouth daily.    [provider]  metoprolol succinate (TOPROL-XL) 50 MG 24 hr tablet Take 50 mg by mouth daily. Take with or immediately following a meal.    [provider]  oxyCODONE-acetaminophen (ROXICET) 5-325 MG per tablet Take 1 tablet by mouth every 4 (four) hours as needed for severe pain. 09/07/14   Nadara Mustard, MD  simvastatin (ZOCOR) 20 MG tablet Take 20 mg by mouth daily.    [provider]    Family History History reviewed. No pertinent family history.  Social History Social History   Tobacco Use  . Smoking status: Never Smoker  . Smokeless tobacco: Never Used  Substance Use Topics  . Alcohol use: No  . Drug use: No     Allergies   Penicillins and Sulfa antibiotics   Review of Systems Review of Systems  All other systems reviewed and are negative.    Physical Exam Updated Vital Signs BP (!) 151/62 (BP Location: Left Arm)   Pulse 81   Temp 98.4 F (36.9  C) (Oral)   Resp 16   Ht 5\' 1"  (1.549 m)   Wt 72.6 kg (160 lb)   SpO2 99%   BMI 30.23 kg/m   Physical Exam  Constitutional: She is oriented to person, place, and time. She appears well-developed and well-nourished.  HENT:  Head: Normocephalic.  Eyes: EOM are normal.  Neck: Normal range of motion.  Pulmonary/Chest: Effort normal.  Abdominal: She exhibits no distension.  Musculoskeletal: Normal range of motion.  Mild swelling of the right middle PIP joint with some pain with range of motion.  No significant tenderness of the right middle finger DIP joint.  No tenderness over the third metacarpal.  Full range of motion of right wrist.  Normal right radial pulse.  Normal perfusion of the middle finger on the  right  Neurological: She is alert and oriented to person, place, and time.  Psychiatric: She has a normal mood and affect.  Nursing note and vitals reviewed.    ED Treatments / Results  Labs (all labs ordered are listed, but only abnormal results are displayed) Labs Reviewed - No data to display  EKG None  Radiology Dg Finger Middle Right  Result Date: 03/22/2018 CLINICAL DATA:  Fall with hand pain EXAM: RIGHT MIDDLE FINGER 2+V COMPARISON:  None. FINDINGS: There is a mildly displaced fracture of the lateral aspect of the base of the third middle phalanx. Fracture fragment is mildly laterally and volarly displaced. 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 IMPRESSION: Laterally in volarly displaced fracture of the lateral aspect of the third middle phalangeal base. Electronically Signed   By: 03/24/2018 M.D.   On: 03/22/2018 15:09    Procedures Procedures (including critical care time)  SPLINT APPLICATION Authorized by: 03/24/2018 Consent: Verbal consent obtained. Risks and benefits: risks, benefits and alternatives were discussed Consent given by: patient Splint applied by: nurse Location details: middle finger Splint type: buddy tape Supplies used: tape Post-procedure: The splinted body part was neurovascularly unchanged following the procedure. Patient tolerance: Patient tolerated the procedure well with no immediate complications.     Medications Ordered in ED Medications  ibuprofen (ADVIL,MOTRIN) tablet 400 mg (400 mg Oral Given 03/22/18 1446)     Initial Impression / Assessment and Plan / ED Course  I have reviewed the triage vital signs and the nursing notes.  Pertinent labs & imaging results that were available during my care of the patient were reviewed by me and considered in my medical decision making (see chart for details).     Middle phalanx fracture.  Discharged home in good condition.  She has an orthopedic surgeon.  She will  follow-up with him.  Buddy tape for comfort  Final Clinical Impressions(s) / ED Diagnoses   Final diagnoses:  None    ED Discharge Orders    None       03/24/18, MD 03/22/18 1535

## 2018-03-27 ENCOUNTER — Ambulatory Visit: Payer: Medicare Other

## 2018-03-27 DIAGNOSIS — M25611 Stiffness of right shoulder, not elsewhere classified: Secondary | ICD-10-CM

## 2018-03-27 DIAGNOSIS — M25511 Pain in right shoulder: Secondary | ICD-10-CM | POA: Diagnosis not present

## 2018-03-27 DIAGNOSIS — M6281 Muscle weakness (generalized): Secondary | ICD-10-CM

## 2018-03-27 DIAGNOSIS — R29898 Other symptoms and signs involving the musculoskeletal system: Secondary | ICD-10-CM

## 2018-03-27 NOTE — Therapy (Signed)
[facility] [address]  [address] [place], [place], [address] Phone: [phone]   Fax:  [phone]  Physical Therapy Treatment  Patient Details  Name: Kayla Case MRN: [id number] Date of Birth: [birth year] Referring Provider: Jene Every   Encounter Date: 03/27/2018  PT End of Session - 03/27/18 1108    Visit Number  13    Number of Visits  21    Date for PT Re-Evaluation  04/18/18    Authorization Type  Medicare + Federal BCBS- VL 75    PT Start Time  1100    PT Stop Time  1144    PT Time Calculation (min)  44 min    Activity Tolerance  Patient tolerated treatment well    Behavior During Therapy  [facility] for tasks assessed/performed       Past Medical History:  Diagnosis Date  . Anxiety   . Depression   . Diabetes mellitus without complication (HCC)   . Hyperlipidemia   . Rheumatoid arthritis (HCC)   . Thyroid disease     Past Surgical History:  Procedure Laterality Date  . ABDOMINAL HYSTERECTOMY    . SHOULDER CLOSED REDUCTION Right 09/07/2014   Procedure: CLOSED REDUCTION SHOULDER;  Surgeon: Nadara Mustard, MD;  Location: [facility] OR;  Service: Orthopedics;  Laterality: Right;    There were no vitals filed for this visit.  Subjective Assessment - 03/27/18 1107    Subjective  Pt. noting she went to ED after falling prior to last visit and X-ray confirmed finger fracture.      Pertinent History  RA, thyroid problems, DM, hx of falls    Diagnostic tests  None since surgery    Patient Stated Goals  to get this arm moving and get strength and mobility back, put bra on    Currently in Pain?  Yes    Pain Score  2     Pain Location  Hand    Pain Orientation  Right    Pain Descriptors / Indicators  Throbbing    Pain Type  Acute pain    Pain Frequency  Constant    Aggravating Factors   gripping    Pain Relieving Factors  Icing                        [facility] Adult PT Treatment/Exercise - 03/27/18 1110       Shoulder Exercises: Standing   External Rotation  10 reps;Both;Theraband    Theraband Level (Shoulder External Rotation)  Level 2 (Red)    External Rotation Limitations  leaning on 1/2 foam on wall     Internal Rotation  Right;10 reps;Theraband    Theraband Level (Shoulder Internal Rotation)  Level 2 (Red)    Flexion  Both;15 reps;AAROM    Flexion Limitations  mirror feedback and light assistance from therapist to decrease excessive scapular elevation     Other Standing Exercises  R shoulder flexion, scaption 1st shelf at cabinet x 30 sec  Required cueing for pacing; pt. reporting "catching" pain       Shoulder Exercises: ROM/Strengthening   UBE (Upper Arm Bike)  L2.5, 3/3    Lat Pull  15 reps    Lat Pull Limitations  15#    Cybex Row  15 reps    Cybex Row Limitations  15#; Cues required for scapular retraction       Manual Therapy   Manual Therapy  Passive ROM  Passive ROM  R shoulder flex, ABD, IR, ER to pt tolerance             PT Education - 03/27/18 1147    Education provided  Yes    Education Details  HEP update     Person(s) Educated  Patient    Methods  Explanation;Demonstration;Verbal cues;Handout    Comprehension  Verbalized understanding;Returned demonstration;Verbal cues required;Need further instruction       PT Short Term Goals - 03/07/18 1318      PT SHORT TERM GOAL #1   Title  Patient to be independent with initial HEP.    Time  4    Period  Weeks    Status  Achieved        PT Long Term Goals - 03/07/18 1318      PT LONG TERM GOAL #1   Title  Patient to be independent with advanced HEP.    Time  6    Period  Weeks    Status  On-going reports compliance with HEP    Target Date  04/18/18      PT LONG TERM GOAL #2   Title  Patient to demonstrate [facility] R shoulder AROM in order to perform ADLs at home.    Time  6    Period  Weeks    Status  On-going    Target Date  04/18/18      PT LONG TERM GOAL #3   Title  Patient to demonstrate  >=4+/5 R UE strength in order to be able to walk dog on leash with dominant hand.     Time  6    Period  Weeks    Status  On-going NT d/t surgical precautions this date    Target Date  04/18/18      PT LONG TERM GOAL #4   Title  Patient to report 0/10 pain when donning bra.    Time  6    Period  Weeks    Status  On-going pain up to 10/10 with donning bra at this time    Target Date  04/18/18            Plan - 03/27/18 1148    Clinical Impression Statement  Pt. seen to start session reporting x-ray confirmed R middle finger fracture following trip to ED from fall ~ week ago.  Therex today avoiding stress to R hand as pt. seen not wearing splint, which was issued to her by ED.  Supervising PT made aware of fracture.  Luceil progressing with therapy able to tolerated increased resistance with IR, ER strengthening today however still showing greatest strength deficit with flexion, scaption motions.  Kadia still requiring heavy cueing to improve scapulohumeral rhythm with overhead motions and will likely require further skilled instruction with this.  Ended session pain free today thus.      PT Treatment/Interventions  ADLs/Self Care Home Management;Cryotherapy;Electrical Stimulation;Iontophoresis 4mg /ml Dexamethasone;Moist Heat;Ultrasound;Gait training;Stair training;Functional mobility training;Balance training;Neuromuscular re-education;Therapeutic activities;Therapeutic exercise;Patient/family education;Manual techniques;Scar mobilization;Passive range of motion;Dry needling;Energy conservation;Taping;Vasopneumatic Device;Splinting    Consulted and Agree with Plan of Care  Patient       Patient will benefit from skilled therapeutic intervention in order to improve the following deficits and impairments:  Hypomobility, Decreased activity tolerance, Decreased strength, Impaired UE functional use, Pain, Decreased mobility, Decreased balance, Decreased range of motion, Impaired flexibility,  Postural dysfunction  Visit Diagnosis: Acute pain of right shoulder  Stiffness of right shoulder, not elsewhere classified  Muscle weakness (  generalized)  Other symptoms and signs involving the musculoskeletal system     Problem List Patient Active Problem List   Diagnosis Date Noted  . Anterior shoulder dislocation 09/07/2014  . Hyperlipidemia     Kermit Balo, PTA 03/27/18 12:01 PM    [facility] [address]  [address] [place], [place], [address] Phone: [phone]   Fax:  [phone]  Name: Kayla Case MRN: [id number] Date of Birth: [birth year]

## 2018-03-28 DIAGNOSIS — S62618A Displaced fracture of proximal phalanx of other finger, initial encounter for closed fracture: Secondary | ICD-10-CM | POA: Insufficient documentation

## 2018-03-30 ENCOUNTER — Ambulatory Visit: Payer: Medicare Other

## 2018-04-03 ENCOUNTER — Ambulatory Visit: Payer: Medicare Other | Admitting: Physical Therapy

## 2018-04-03 DIAGNOSIS — R29898 Other symptoms and signs involving the musculoskeletal system: Secondary | ICD-10-CM

## 2018-04-03 DIAGNOSIS — M25511 Pain in right shoulder: Secondary | ICD-10-CM

## 2018-04-03 DIAGNOSIS — M25611 Stiffness of right shoulder, not elsewhere classified: Secondary | ICD-10-CM

## 2018-04-03 DIAGNOSIS — M6281 Muscle weakness (generalized): Secondary | ICD-10-CM

## 2018-04-03 NOTE — Therapy (Signed)
[facility] [address]  [address] [place], [place], [address] Phone: [phone]   Fax:  [phone]  Physical Therapy Treatment  Patient Details  Name: Kayla Case MRN: [id number] Date of Birth: [birth year] Referring Provider: Jene Every   Encounter Date: 04/03/2018  PT End of Session - 04/03/18 1714    Visit Number  14    Number of Visits  21    Date for PT Re-Evaluation  04/18/18    Authorization Type  Medicare + Federal BCBS- VL 75    PT Start Time  1312    PT Stop Time  1405    PT Time Calculation (min)  53 min    Activity Tolerance  Patient tolerated treatment well;Patient limited by pain    Behavior During Therapy  [facility] for tasks assessed/performed       Past Medical History:  Diagnosis Date  . Anxiety   . Depression   . Diabetes mellitus without complication (HCC)   . Hyperlipidemia   . Rheumatoid arthritis (HCC)   . Thyroid disease     Past Surgical History:  Procedure Laterality Date  . ABDOMINAL HYSTERECTOMY    . SHOULDER CLOSED REDUCTION Right 09/07/2014   Procedure: CLOSED REDUCTION SHOULDER;  Surgeon: Nadara Mustard, MD;  Location: [facility] OR;  Service: Orthopedics;  Laterality: Right;    There were no vitals filed for this visit.  Subjective Assessment - 04/03/18 1314    Subjective  Patient reports she saw MD who told her to keep splint on broken finger for 2 weeks- reports she has not been wearing it because it is not convenient. Reports she did something to her R shoulder yesterday and it was so painful it put tears in her eyes. Reports she is 50% improved since initial POC and notices improvements in daily activities.    Pertinent History  RA, thyroid problems, DM, hx of falls    Diagnostic tests  None since surgery    Patient Stated Goals  to get this arm moving and get strength and mobility back, put bra on    Currently in Pain?  Yes    Pain Score  3     Pain Location  Shoulder    Pain  Orientation  Right    Pain Descriptors / Indicators  Discomfort    Pain Type  Acute pain                       [facility] Adult PT Treatment/Exercise - 04/03/18 0001      Shoulder Exercises: Prone   Other Prone Exercises  R UE row; 15x 2# leaning over orange pball    Other Prone Exercises  prone R UE T over orange pball; 10x VC/TCs for form and avoiding shoulder elevation      Shoulder Exercises: Sidelying   External Rotation  Right;Strengthening;Limitations;Weights;10 reps    External Rotation Weight (lbs)  1#    External Rotation Limitations  10x 1#, 10x 2#; 90/90 with dowel under elbow    ABduction  AROM;Right;10 reps;Limitations    ABduction Limitations  to tolerance with thumb up; TCs to avoid shoulder elevation intermittent c/o catching pain      Shoulder Exercises: Standing   Protraction  AROM;Both;10 reps;Limitations    Protraction Limitations  standing with B UEs against the wall protraction pr reporting catching pain in R deltoid, unable to continue     External Rotation  10 reps;Both;Theraband  Theraband Level (Shoulder External Rotation)  Level 2 (Red)    External Rotation Limitations  VCs to avoid entire trunk rotation    Flexion  AROM;Right;10 reps;Limitations    Flexion Limitations  VCs to avoid entire trunk rotation      Shoulder Exercises: ROM/Strengthening   UBE (Upper Arm Bike)  L2.5, 3/3      Shoulder Exercises: Stretch   Corner Stretch  2 reps;20 seconds    Corner Stretch Limitations  90/90 doorway pec stretch cues not to push into pain      Vasopneumatic   Number Minutes Vasopneumatic   15 minutes    Vasopnuematic Location   Shoulder R    Vasopneumatic Pressure  Low    Vasopneumatic Temperature   Coldest      Manual Therapy   Manual Therapy  Soft tissue mobilization;Passive ROM    Soft tissue mobilization  R pec, deltoid UT- mildly TTP in UT    Passive ROM  R shoulder flex, ABD, IR, ER to pt tolerance report of catching on return from  flexion               PT Short Term Goals - 03/07/18 1318      PT SHORT TERM GOAL #1   Title  Patient to be independent with initial HEP.    Time  4    Period  Weeks    Status  Achieved        PT Long Term Goals - 03/07/18 1318      PT LONG TERM GOAL #1   Title  Patient to be independent with advanced HEP.    Time  6    Period  Weeks    Status  On-going reports compliance with HEP    Target Date  04/18/18      PT LONG TERM GOAL #2   Title  Patient to demonstrate [facility] R shoulder AROM in order to perform ADLs at home.    Time  6    Period  Weeks    Status  On-going    Target Date  04/18/18      PT LONG TERM GOAL #3   Title  Patient to demonstrate >=4+/5 R UE strength in order to be able to walk dog on leash with dominant hand.     Time  6    Period  Weeks    Status  On-going NT d/t surgical precautions this date    Target Date  04/18/18      PT LONG TERM GOAL #4   Title  Patient to report 0/10 pain when donning bra.    Time  6    Period  Weeks    Status  On-going pain up to 10/10 with donning bra at this time    Target Date  04/18/18            Plan - 04/03/18 1715    Clinical Impression Statement  Patient arrived with report of episode of R shoulder pain yesterday which "brought me to tears." Today with no increased pain. Patient reports 50% improvement in R shoulder since initial eval. Patient notes still having trouble with overhead reaching and washing hair. Tolerated R shoulder PROM and STM- patient without tenderness with STM however still with catching pain in R deltoid with PROM. Progressed sidelying ER with increased weighted resistance this date- patient reporting muscle burn but no pain. Also tolerated prone rows and prone "T's" with VC/TCs for form but good tolerance. Patient  unable to tolerate standing push up plus d/t catching pain- discontinued. Demonstrated excessive R shoulder hiking with R shoulder flexion; mildly improved with heavy TCs to  control this compensation. Patient requested [employer] to R shoulder for pain relief. No c/o pain at end of session.     PT Treatment/Interventions  ADLs/Self Care Home Management;Cryotherapy;Electrical Stimulation;Iontophoresis 4mg /ml Dexamethasone;Moist Heat;Ultrasound;Gait training;Stair training;Functional mobility training;Balance training;Neuromuscular re-education;Therapeutic activities;Therapeutic exercise;Patient/family education;Manual techniques;Scar mobilization;Passive range of motion;Dry needling;Energy conservation;Taping;Vasopneumatic Device;Splinting    PT Next Visit Plan  prone I, T, Y over ball    Consulted and Agree with Plan of Care  Patient       Patient will benefit from skilled therapeutic intervention in order to improve the following deficits and impairments:  Hypomobility, Decreased activity tolerance, Decreased strength, Impaired UE functional use, Pain, Decreased mobility, Decreased balance, Decreased range of motion, Impaired flexibility, Postural dysfunction  Visit Diagnosis: Acute pain of right shoulder  Stiffness of right shoulder, not elsewhere classified  Muscle weakness (generalized)  Other symptoms and signs involving the musculoskeletal system     Problem List Patient Active Problem List   Diagnosis Date Noted  . Anterior shoulder dislocation 09/07/2014  . Hyperlipidemia     11/06/2014, PT, DPT 04/03/18 5:23 PM   [facility] [address]  [address] [place], [place], [place] Phone: [phone]   Fax:  [phone]  Name: Anniya Whiters MRN: [name] Date of Birth: [birth year]

## 2018-04-06 ENCOUNTER — Ambulatory Visit: Payer: Medicare Other | Attending: Specialist

## 2018-04-06 DIAGNOSIS — M25511 Pain in right shoulder: Secondary | ICD-10-CM | POA: Diagnosis present

## 2018-04-06 DIAGNOSIS — R29898 Other symptoms and signs involving the musculoskeletal system: Secondary | ICD-10-CM | POA: Diagnosis present

## 2018-04-06 DIAGNOSIS — M25611 Stiffness of right shoulder, not elsewhere classified: Secondary | ICD-10-CM

## 2018-04-06 DIAGNOSIS — M6281 Muscle weakness (generalized): Secondary | ICD-10-CM

## 2018-04-06 NOTE — Therapy (Signed)
[facility] [address]  [address] [place], [place], [address] Phone: [phone]   Fax:  [phone]  Physical Therapy Treatment  Patient Details  Name: Kayla Case MRN: [id number] Date of Birth: 01-31-1946 Referring Provider: Jene Every   Encounter Date: 04/06/2018  PT End of Session - 04/06/18 1318    Visit Number  15    Number of Visits  21    Date for PT Re-Evaluation  04/18/18    Authorization Type  Medicare + Federal BCBS- VL 75    PT Start Time  1314    PT Stop Time  1410    PT Time Calculation (min)  56 min    Activity Tolerance  Patient tolerated treatment well;Patient limited by pain    Behavior During Therapy  [facility] for tasks assessed/performed       Past Medical History:  Diagnosis Date  . Anxiety   . Depression   . Diabetes mellitus without complication (HCC)   . Hyperlipidemia   . Rheumatoid arthritis (HCC)   . Thyroid disease     Past Surgical History:  Procedure Laterality Date  . ABDOMINAL HYSTERECTOMY    . SHOULDER CLOSED REDUCTION Right 09/07/2014   Procedure: CLOSED REDUCTION SHOULDER;  Surgeon: Nadara Mustard, MD;  Location: [facility] OR;  Service: Orthopedics;  Laterality: Right;    There were no vitals filed for this visit.  Subjective Assessment - 04/06/18 1317    Subjective  Pt. reporting some R shoulder pain reachin behind head recently.  Pt. noting greater ease with donning/doffing bra.      Pertinent History  RA, thyroid problems, DM, hx of falls    Diagnostic tests  None since surgery    Patient Stated Goals  to get this arm moving and get strength and mobility back, put bra on    Currently in Pain?  Yes    Pain Score  3     Pain Location  Shoulder    Pain Orientation  Right    Pain Descriptors / Indicators  Sharp    Pain Type  Acute pain    Pain Onset  More than a month ago    Pain Frequency  Constant    Multiple Pain Sites  No                       [facility] Adult  PT Treatment/Exercise - 04/06/18 1334      Shoulder Exercises: Prone   Flexion  Both;10 reps    Flexion Weight (lbs)  1    Flexion Limitations  Prone Y's on green p-ball     Extension  Both;10 reps    Extension Weight (lbs)  1    Extension Limitations  Prone I's on green p-ball     Horizontal ABduction 1  Both;10 reps;Weights;Strengthening    Horizontal ABduction 1 Weight (lbs)  1    Horizontal ABduction 1 Limitations  Prone T's on green p-ball       Shoulder Exercises: Sidelying   External Rotation  Right;Weights;Limitations;Strengthening x 12     External Rotation Weight (lbs)  2#      Shoulder Exercises: Standing   External Rotation  Right;15 reps;Theraband;Strengthening    Theraband Level (Shoulder External Rotation)  Level 2 (Red)    Internal Rotation  Right;15 reps;Theraband;Strengthening    Theraband Level (Shoulder Internal Rotation)  Level 2 (Red)    Flexion  Both;15 reps    Flexion Limitations  0-90 dg; excessive scap. R elevation above 90 dg     ABduction  Both;10 reps;Strengthening    ABduction Limitations  leanin on pool noodle along spine with cues to squeeze scap.  0-90 dg     Retraction  Both;10 reps 5" hold       Shoulder Exercises: Pulleys   Flexion  3 minutes    ABduction  3 minutes      Shoulder Exercises: ROM/Strengthening   Cybex Row  15 reps    Cybex Row Limitations  20#; Cues required for scapular retraction                PT Short Term Goals - 03/07/18 1318      PT SHORT TERM GOAL #1   Title  Patient to be independent with initial HEP.    Time  4    Period  Weeks    Status  Achieved        PT Long Term Goals - 03/07/18 1318      PT LONG TERM GOAL #1   Title  Patient to be independent with advanced HEP.    Time  6    Period  Weeks    Status  On-going reports compliance with HEP    Target Date  04/18/18      PT LONG TERM GOAL #2   Title  Patient to demonstrate [facility] R shoulder AROM in order to perform ADLs at home.    Time  6     Period  Weeks    Status  On-going    Target Date  04/18/18      PT LONG TERM GOAL #3   Title  Patient to demonstrate >=4+/5 R UE strength in order to be able to walk dog on leash with dominant hand.     Time  6    Period  Weeks    Status  On-going NT d/t surgical precautions this date    Target Date  04/18/18      PT LONG TERM GOAL #4   Title  Patient to report 0/10 pain when donning bra.    Time  6    Period  Weeks    Status  On-going pain up to 10/10 with donning bra at this time    Target Date  04/18/18            Plan - 04/06/18 1318    Clinical Impression Statement  Pt. noting she is still most limited with reaching forward and to shoulder height however doing well overall today.  Tolerated progression of RTC/scapular strengthening activities well today with addition of I's, T's, Y's.  Does still require frequent cueing to reduce UT compensation and tactile cueing for improved scapulohumeral rhythm with elevation activities.  Was able to demo increased full gravity flexion ROM before onset of R UT substitution today.  Ended session with pt. requesting ice/compression machine to R shoulder as she notes this improves post-exercise soreness and swelling.      Clinical Impairments Affecting Rehab Potential  previous experience with PT    PT Treatment/Interventions  ADLs/Self Care Home Management;Cryotherapy;Electrical Stimulation;Iontophoresis 4mg /ml Dexamethasone;Moist Heat;Ultrasound;Gait training;Stair training;Functional mobility training;Balance training;Neuromuscular re-education;Therapeutic activities;Therapeutic exercise;Patient/family education;Manual techniques;Scar mobilization;Passive range of motion;Dry needling;Energy conservation;Taping;Vasopneumatic Device;Splinting    Consulted and Agree with Plan of Care  Patient       Patient will benefit from skilled therapeutic intervention in order to improve the following deficits and impairments:  Hypomobility, Decreased  activity tolerance, Decreased strength,  Impaired UE functional use, Pain, Decreased mobility, Decreased balance, Decreased range of motion, Impaired flexibility, Postural dysfunction  Visit Diagnosis: Acute pain of right shoulder  Muscle weakness (generalized)  Stiffness of right shoulder, not elsewhere classified  Other symptoms and signs involving the musculoskeletal system     Problem List Patient Active Problem List   Diagnosis Date Noted  . Anterior shoulder dislocation 09/07/2014  . Hyperlipidemia     Kermit Balo, [place] 04/06/18 6:14 PM   [facility] [address]  [address] [place], [place], [address] Phone: [phone]   Fax:  [phone]  Name: Kayla Case MRN: [id number] Date of Birth: Mar 12, 1946

## 2018-04-10 ENCOUNTER — Ambulatory Visit: Payer: Medicare Other

## 2018-04-10 DIAGNOSIS — R29898 Other symptoms and signs involving the musculoskeletal system: Secondary | ICD-10-CM

## 2018-04-10 DIAGNOSIS — M25511 Pain in right shoulder: Secondary | ICD-10-CM

## 2018-04-10 DIAGNOSIS — M25611 Stiffness of right shoulder, not elsewhere classified: Secondary | ICD-10-CM

## 2018-04-10 DIAGNOSIS — M6281 Muscle weakness (generalized): Secondary | ICD-10-CM

## 2018-04-10 NOTE — Therapy (Signed)
[facility] [address]  [address] [place], [place], [address] Phone: [phone]   Fax:  [phone]  Physical Therapy Treatment  Patient Details  Name: Kayla Case MRN: [id number] Date of Birth: [birth year] Referring Provider: Jene Every   Encounter Date: 04/10/2018  PT End of Session - 04/10/18 1315    Visit Number  16    Number of Visits  21    Date for PT Re-Evaluation  04/18/18    Authorization Type  Medicare + Federal BCBS- VL 75    PT Start Time  1312    PT Stop Time  1350    PT Time Calculation (min)  38 min    Activity Tolerance  Patient tolerated treatment well;Patient limited by pain    Behavior During Therapy  [facility] for tasks assessed/performed       Past Medical History:  Diagnosis Date  . Anxiety   . Depression   . Diabetes mellitus without complication (HCC)   . Hyperlipidemia   . Rheumatoid arthritis (HCC)   . Thyroid disease     Past Surgical History:  Procedure Laterality Date  . ABDOMINAL HYSTERECTOMY    . SHOULDER CLOSED REDUCTION Right 09/07/2014   Procedure: CLOSED REDUCTION SHOULDER;  Surgeon: Nadara Mustard, MD;  Location[facility];  Service: Orthopedics;  Laterality: Right;    There were no vitals filed for this visit.  Subjective Assessment - 04/10/18 1314    Subjective  Doing well today with no new complaints.      Pertinent History  RA, thyroid problems, DM, hx of falls    Diagnostic tests  None since surgery    Patient Stated Goals  to get this arm moving and get strength and mobility back, put bra on    Currently in Pain?  No/denies    Pain Score  0-No pain    Multiple Pain Sites  No                       [facility] Adult PT Treatment/Exercise - 04/10/18 1317      Shoulder Exercises: Supine   Protraction  15 reps;Both    Protraction Limitations  3    Flexion  Right;15 reps;Weights to improve control in 30-60 dg arc     Shoulder Flexion Weight (lbs)  1       Shoulder Exercises: Standing   Flexion  Both;10 reps    Flexion Limitations  0-90 dg; 1#; excessive scap. R elevation above 90 dg     Row  15 reps;Strengthening;Theraband;Both    Theraband Level (Shoulder Row)  Level 3 (Green)    Other Standing Exercises  D1/D2 flexion/ext. with yellow TB x 10 reps each way requiring min assist from therapist for flexion at end range      Shoulder Exercises: ROM/Strengthening   UBE (Upper Arm Bike)  L3.0, 3/3    Lat Pull  20 reps    Lat Pull Limitations  15#    Wall Pushups  15 reps    Wall Pushups Limitations  leaning on orange p-ball on wall       Manual Therapy   Manual Therapy  Passive ROM    Passive ROM  R shoulder flex, ABD, IR, ER to pt tolerance               PT Short Term Goals - 03/07/18 1318      PT SHORT TERM GOAL #1   Title  Patient to be independent with initial HEP.    Time  4    Period  Weeks    Status  Achieved        PT Long Term Goals - 03/07/18 1318      PT LONG TERM GOAL #1   Title  Patient to be independent with advanced HEP.    Time  6    Period  Weeks    Status  On-going reports compliance with HEP    Target Date  04/18/18      PT LONG TERM GOAL #2   Title  Patient to demonstrate [facility] R shoulder AROM in order to perform ADLs at home.    Time  6    Period  Weeks    Status  On-going    Target Date  04/18/18      PT LONG TERM GOAL #3   Title  Patient to demonstrate >=4+/5 R UE strength in order to be able to walk dog on leash with dominant hand.     Time  6    Period  Weeks    Status  On-going NT d/t surgical precautions this date    Target Date  04/18/18      PT LONG TERM GOAL #4   Title  Patient to report 0/10 pain when donning bra.    Time  6    Period  Weeks    Status  On-going pain up to 10/10 with donning bra at this time    Target Date  04/18/18            Plan - 04/10/18 1316    Clinical Impression Statement  Pt. noting reaching across body with weight is still difficult.  Still  noting difficulty fixing hair.  Does feel pain levels have improved over last few weeks with only occasional "catching" pain reaching overhead.  Able to progression elevation activities today however does require cueing to avoid substitutions.  Still demonstrating greatest limitations with elevation activities at this point.  Progressing well toward goals    Clinical Impairments Affecting Rehab Potential  previous experience with PT    PT Treatment/Interventions  ADLs/Self Care Home Management;Cryotherapy;Electrical Stimulation;Iontophoresis 4mg /ml Dexamethasone;Moist Heat;Ultrasound;Gait training;Stair training;Functional mobility training;Balance training;Neuromuscular re-education;Therapeutic activities;Therapeutic exercise;Patient/family education;Manual techniques;Scar mobilization;Passive range of motion;Dry needling;Energy conservation;Taping;Vasopneumatic Device;Splinting    Consulted and Agree with Plan of Care  Patient       Patient will benefit from skilled therapeutic intervention in order to improve the following deficits and impairments:  Hypomobility, Decreased activity tolerance, Decreased strength, Impaired UE functional use, Pain, Decreased mobility, Decreased balance, Decreased range of motion, Impaired flexibility, Postural dysfunction  Visit Diagnosis: Acute pain of right shoulder  Muscle weakness (generalized)  Stiffness of right shoulder, not elsewhere classified  Other symptoms and signs involving the musculoskeletal system     Problem List Patient Active Problem List   Diagnosis Date Noted  . Anterior shoulder dislocation 09/07/2014  . Hyperlipidemia     11/06/2014, PTA 04/10/18 1:57 PM   [facility] [address]  [address] [place], [place], [place] Phone: [phone]   Fax:  [phone]  Name: Kayla Case MRN: [name] Date of Birth: 07/23/1946

## 2018-04-13 ENCOUNTER — Encounter: Payer: Self-pay | Admitting: Physical Therapy

## 2018-04-13 ENCOUNTER — Ambulatory Visit: Payer: Medicare Other | Admitting: Physical Therapy

## 2018-04-13 DIAGNOSIS — M25511 Pain in right shoulder: Secondary | ICD-10-CM

## 2018-04-13 DIAGNOSIS — M25611 Stiffness of right shoulder, not elsewhere classified: Secondary | ICD-10-CM

## 2018-04-13 DIAGNOSIS — R29898 Other symptoms and signs involving the musculoskeletal system: Secondary | ICD-10-CM

## 2018-04-13 DIAGNOSIS — M6281 Muscle weakness (generalized): Secondary | ICD-10-CM

## 2018-04-13 NOTE — Therapy (Addendum)
[facility] [address]  [place], [place], [address] Phone: [phone]   Fax:  [phone]  Physical Therapy Treatment  Patient Details  Name: Kayla Case MRN: [id number] Date of Birth: 03-Jun-1946 Referring Provider: Susa Day   Progress Note Reporting Period 03/14/18 to 04/13/18  See note below for Objective Data and Assessment of Progress/Goals.    Encounter Date: 04/13/2018  PT End of Session - 04/13/18 1821    Visit Number  17    Number of Visits  21    Date for PT Re-Evaluation  04/18/18    Authorization Type  Medicare + Federal BCBS- VL 75    PT Start Time  1310    PT Stop Time  1352    PT Time Calculation (min)  42 min    Activity Tolerance  Patient tolerated treatment well;Patient limited by pain    Behavior During Therapy  [facility] for tasks assessed/performed       Past Medical History:  Diagnosis Date  . Anxiety   . Depression   . Diabetes mellitus without complication ([place])   . Hyperlipidemia   . Rheumatoid arthritis ([place])   . Thyroid disease     Past Surgical History:  Procedure Laterality Date  . ABDOMINAL HYSTERECTOMY    . SHOULDER CLOSED REDUCTION Right 09/07/2014   Procedure: CLOSED REDUCTION SHOULDER;  Surgeon: Newt Minion, MD;  Location: [place];  Service: Orthopedics;  Laterality: Right;    There were no vitals filed for this visit.  Subjective Assessment - 04/13/18 1311    Subjective  Reports she is sore today after vacuuming yesterday. Reports she feels that she is ready to continue with HEP at home. Reports 50% improvement since intial eval.     Pertinent History  RA, thyroid problems, DM, hx of falls    Diagnostic tests  None since surgery    Patient Stated Goals  to get this arm moving and get strength and mobility back, put bra on    Currently in Pain?  Yes    Pain Score  5     Pain Location  Shoulder    Pain Orientation  Right    Pain Descriptors / Indicators  Sore     Pain Type  Acute pain         [facility] PT Assessment - 04/13/18 0001      Observation/Other Assessments   Focus on Therapeutic Outcomes (FOTO)   Shoulder: 53 (47 limited, 36% predicted)      AROM   AROM Assessment Site  Shoulder    Right/Left Shoulder  Right    Right Shoulder Flexion  85 Degrees    Right Shoulder ABduction  75 Degrees    Right Shoulder Internal Rotation  --   FIR T12   Right Shoulder External Rotation  --   FER back of head     PROM   PROM Assessment Site  Shoulder    Right/Left Shoulder  Right    Right Shoulder Flexion  130 Degrees    Right Shoulder ABduction  130 Degrees    Right Shoulder Internal Rotation  83 Degrees    Right Shoulder External Rotation  70 Degrees      Strength   Strength Assessment Site  Shoulder    Right/Left Shoulder  Right    Right Shoulder Flexion  4-/5    Right Shoulder ABduction  3+/5    Right Shoulder Internal Rotation  --  unable to tolerate   Right Shoulder External Rotation  --   unable to tolerate                  [facility] Adult PT Treatment/Exercise - 04/13/18 0001      Shoulder Exercises: Supine   Protraction  15 reps;Both    Protraction Limitations  3#; 2x15; 1st set with weight, 2nd set with red TB    Flexion  Right;Weights;10 reps    Shoulder Flexion Weight (lbs)  1      Shoulder Exercises: Sidelying   External Rotation  Right;Weights;Limitations;Strengthening;10 reps    External Rotation Weight (lbs)  2#    External Rotation Limitations  dowel under elbow; able to tolerate 2x8      Shoulder Exercises: Standing   External Rotation  Right;15 reps;Theraband;Strengthening    Theraband Level (Shoulder External Rotation)  Level 2 (Red)    External Rotation Limitations  dowel under elbow    Internal Rotation  Right;15 reps;Theraband;Strengthening;Limitations    Theraband Level (Shoulder Internal Rotation)  Level 2 (Red)    Internal Rotation Limitations  dowel under elbow    Flexion  Right;10  reps;Weights;Limitations    Shoulder Flexion Weight (lbs)  1    Flexion Limitations  tolerated 5x with 1#, 5x with 0#; heavy manual cues to avoid shoulder elevation    ABduction  AROM;Right;10 reps;Limitations    ABduction Limitations  manual cues for upward rotation of scapula     Row  15 reps;Strengthening;Theraband;Both    Theraband Level (Shoulder Row)  Level 2 (Red);Level 3 (Green)    Row Limitations  15 red, 15 green      Shoulder Exercises: ROM/Strengthening   UBE (Upper Arm Bike)  L3.0, 3/3    Cybex Row  15 reps    Cybex Row Limitations  20#; Cues required for scapular retraction       Shoulder Exercises: Stretch   Corner Stretch  2 reps;20 seconds    Corner Stretch Limitations  90/90 doorway pec stretch             PT Education - 04/13/18 1820    Education provided  Yes    Education Details  consolidated HEP handout administered    Person(s) Educated  Patient    Methods  Explanation;Demonstration;Tactile cues;Verbal cues;Handout    Comprehension  Returned demonstration;Verbalized understanding       PT Short Term Goals - 04/13/18 1318      PT SHORT TERM GOAL #1   Title  Patient to be independent with initial HEP.    Time  4    Period  Weeks    Status  Achieved        PT Long Term Goals - 04/13/18 1318      PT LONG TERM GOAL #1   Title  Patient to be independent with advanced HEP.    Time  6    Period  Weeks    Status  Achieved   "pretty consistent with HEP"     PT LONG TERM GOAL #2   Title  Patient to demonstrate [facility] R shoulder AROM in order to perform ADLs at home.    Time  6    Period  Weeks    Status  Not Met   improvements made in R shoulder AROM but still significantly limited     PT LONG TERM GOAL #3   Title  Patient to demonstrate >=4+/5 R UE strength in order to be able to walk dog  on leash with dominant hand.     Time  6    Period  Weeks    Status  Not Met   Improvement noted in R shoulder strength in flexion and abduction however  not able to tolerate IR/ER testing     PT LONG TERM GOAL #4   Title  Patient to report 0/10 pain when donning bra.    Time  6    Period  Weeks    Status  Not Met   reports significant difficulty and compensations at neck required to don bra           Plan - 04/13/18 1832    Clinical Impression Statement  Patient arrived to session with report of 50% improvement in R shoulder since initial eval. Reports still having trouble with vacuuming and doing hair, however notes improvements in ability to reach with R UE. Patient however mentions that she tends to avoid using R UE at home and substitutes with L UE for most ADLs. Advised patient that this may lead to further impairments. Patient reported understanding. Updated goals- patient able to demonstrate R shoulder AROM however with significant limitation and compensations at shoulder. Improvement noted in R shoulder strength in flexion and abduction however not able to tolerate IR/ER testing. Still having trouble donning bra. Administered consolidated HEP handout and gave patient resistance band. Also advised patient to continue using pulley 2x/day to maintain ROM. Patient reported understanding. Worked on RTC strengthening and AROM, working on preventing compensations with TCs. Patient with limited tolerance of AROM. Patient has not met goals at this time and hesitant to participate with progression of exercises at PT sessions. Discussed placing patient on 30 day hold at this time d/t plateau in progress. Patient agreeable.    PT Treatment/Interventions  ADLs/Self Care Home Management;Cryotherapy;Electrical Stimulation;Iontophoresis 34m/ml Dexamethasone;Moist Heat;Ultrasound;Gait training;Stair training;Functional mobility training;Balance training;Neuromuscular re-education;Therapeutic activities;Therapeutic exercise;Patient/family education;Manual techniques;Scar mobilization;Passive range of motion;Dry needling;Energy  conservation;Taping;Vasopneumatic Device;Splinting    PT Next Visit Plan  30 day hold at this time    Consulted and Agree with Plan of Care  Patient       Patient will benefit from skilled therapeutic intervention in order to improve the following deficits and impairments:  Hypomobility, Decreased activity tolerance, Decreased strength, Impaired UE functional use, Pain, Decreased mobility, Decreased balance, Decreased range of motion, Impaired flexibility, Postural dysfunction  Visit Diagnosis: Acute pain of right shoulder  Muscle weakness (generalized)  Stiffness of right shoulder, not elsewhere classified  Other symptoms and signs involving the musculoskeletal system     Problem List Patient Active Problem List   Diagnosis Date Noted  . Anterior shoulder dislocation 09/07/2014  . Hyperlipidemia     YJanene Harvey PT, DPT 04/13/18 6:36 PM   [facility] [address] [place] [address]Phone: [phone]  Fax:  [phone] Name: EZani KyllonenMRN: [id number] of Birth: [birth year] PHYSICAL THERAPY DISCHARGE SUMMARY  Visits from Start of Care: 17   Current functional level related to goals / functional outcomes: See above clinical impression; patient did not return after being placed on 30 day hold   Remaining deficits: See above   Education / Equipment: HEP  Plan: Patient agrees to discharge.  Patient goals were not met. Patient is being discharged due to being pleased with the current functional level.  ?????    YJanene Harvey PT, DPT 05/17/18 12:57 PM

## 2018-08-20 IMAGING — CR DG FINGER MIDDLE 2+V*R*
3 series · 3 of 3 positions shown · non-contrast
Comparison: None.

CLINICAL DATA: Fall with hand pain

EXAM:
RIGHT MIDDLE FINGER 2+V

[x finger pa right]
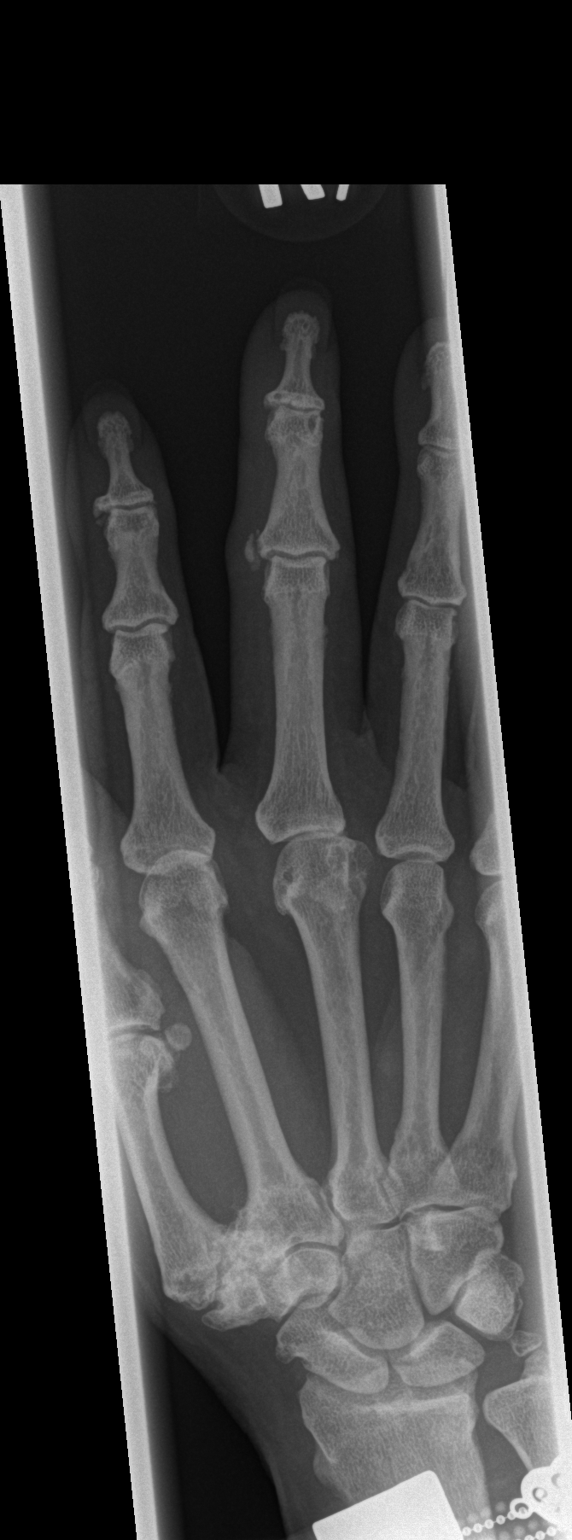

[x finger obl. right]
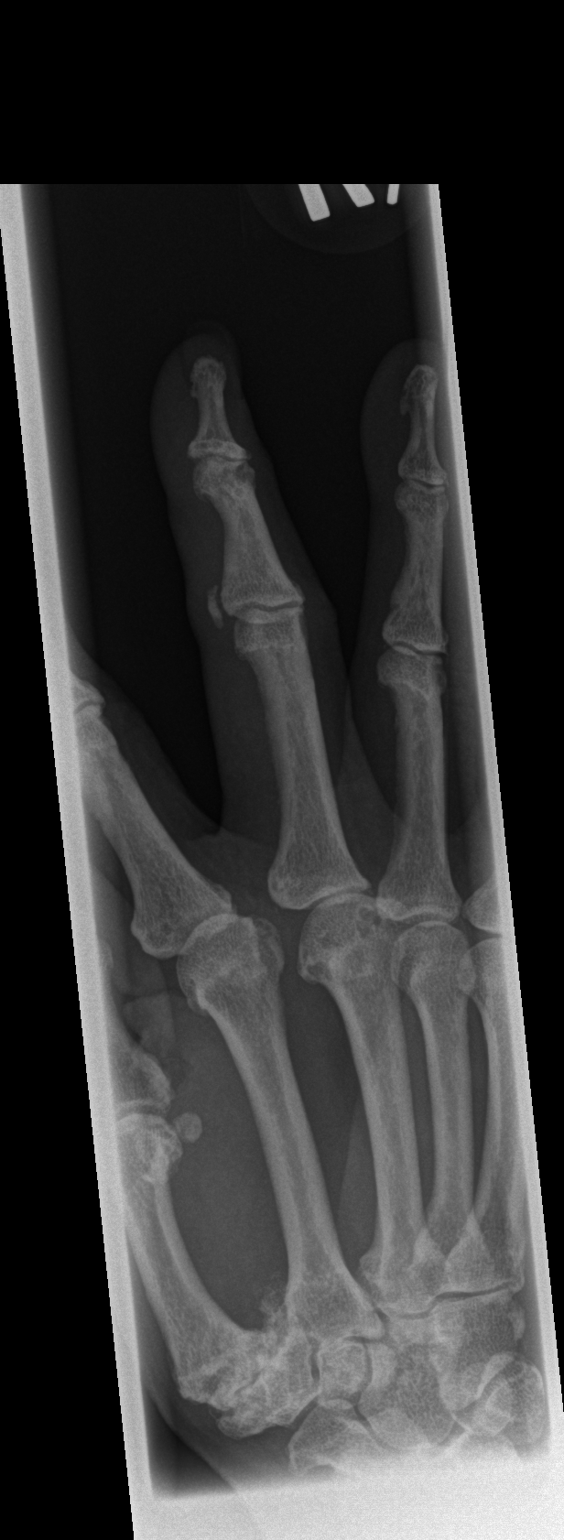

[x finger lateral right]
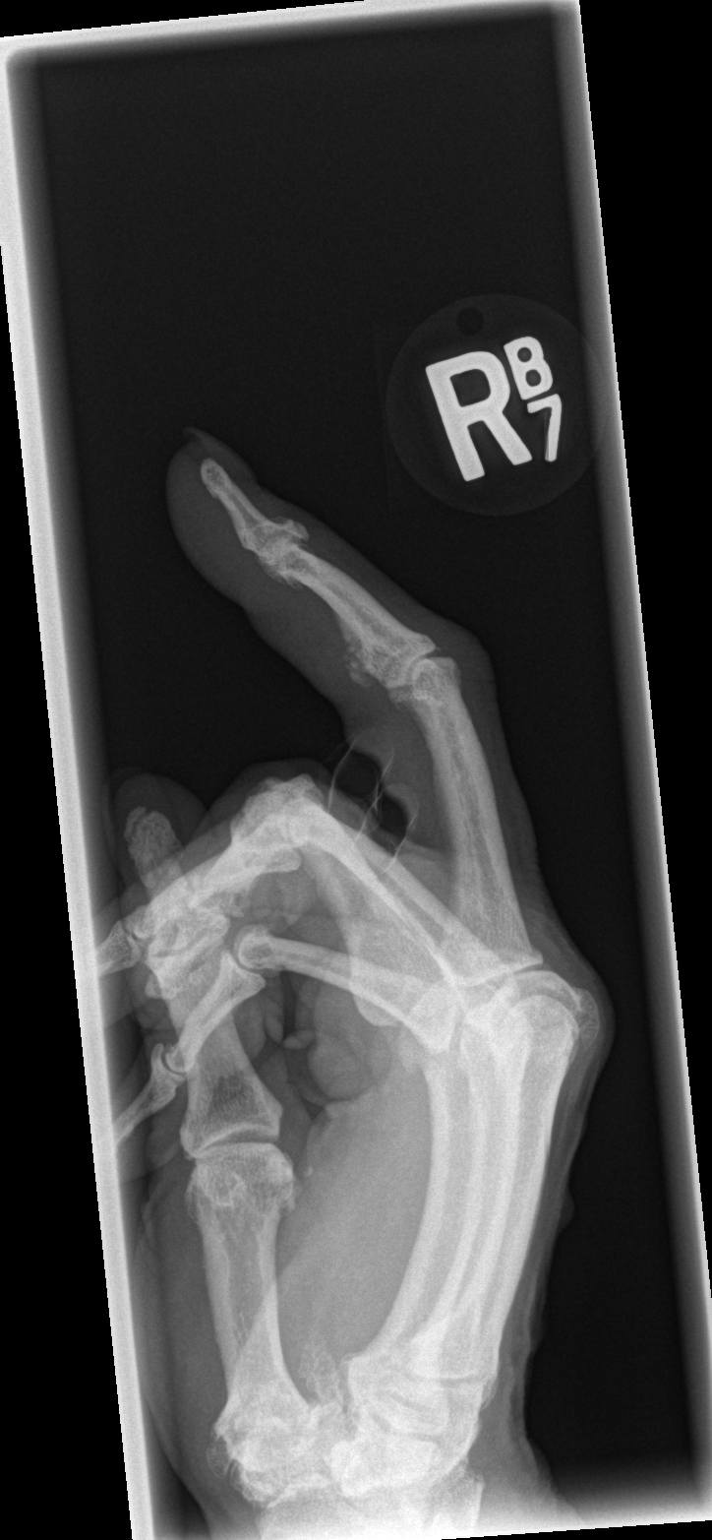

[3 of 3 positions shown; findings below may reference images not displayed]

FINDINGS: There is a mildly displaced fracture of the lateral aspect of the
base of the third middle phalanx. Fracture fragment is mildly
laterally and volarly displaced. There is moderate first
carpometacarpal joint osteoarthrosis.
IMPRESSION: Laterally in volarly displaced fracture of the lateral aspect of the
third middle phalangeal base.

## 2019-05-11 DIAGNOSIS — M19079 Primary osteoarthritis, unspecified ankle and foot: Secondary | ICD-10-CM | POA: Insufficient documentation

## 2019-11-12 DIAGNOSIS — M25561 Pain in right knee: Secondary | ICD-10-CM | POA: Insufficient documentation

## 2019-11-30 HISTORY — PX: KNEE SURGERY: SHX244

## 2019-12-12 ENCOUNTER — Encounter (INDEPENDENT_AMBULATORY_CARE_PROVIDER_SITE_OTHER): Payer: Self-pay

## 2019-12-12 ENCOUNTER — Ambulatory Visit (INDEPENDENT_AMBULATORY_CARE_PROVIDER_SITE_OTHER): Payer: Medicare Other | Admitting: Physician Assistant

## 2019-12-12 ENCOUNTER — Encounter (INDEPENDENT_AMBULATORY_CARE_PROVIDER_SITE_OTHER): Payer: Self-pay | Admitting: Physician Assistant

## 2019-12-12 VITALS — BP 135/82 | HR 89 | Ht 60.5 in | Wt 161.0 lb

## 2019-12-12 DIAGNOSIS — M069 Rheumatoid arthritis, unspecified: Secondary | ICD-10-CM

## 2019-12-12 DIAGNOSIS — J301 Allergic rhinitis due to pollen: Secondary | ICD-10-CM

## 2019-12-12 DIAGNOSIS — F419 Anxiety disorder, unspecified: Secondary | ICD-10-CM | POA: Insufficient documentation

## 2019-12-12 DIAGNOSIS — F329 Major depressive disorder, single episode, unspecified: Secondary | ICD-10-CM

## 2019-12-12 DIAGNOSIS — Z794 Long term (current) use of insulin: Secondary | ICD-10-CM

## 2019-12-12 DIAGNOSIS — E785 Hyperlipidemia, unspecified: Secondary | ICD-10-CM | POA: Insufficient documentation

## 2019-12-12 DIAGNOSIS — E782 Mixed hyperlipidemia: Secondary | ICD-10-CM

## 2019-12-12 DIAGNOSIS — E1142 Type 2 diabetes mellitus with diabetic polyneuropathy: Secondary | ICD-10-CM

## 2019-12-12 DIAGNOSIS — J302 Other seasonal allergic rhinitis: Secondary | ICD-10-CM | POA: Insufficient documentation

## 2019-12-12 DIAGNOSIS — I10 Essential (primary) hypertension: Secondary | ICD-10-CM | POA: Insufficient documentation

## 2019-12-12 DIAGNOSIS — E119 Type 2 diabetes mellitus without complications: Secondary | ICD-10-CM | POA: Insufficient documentation

## 2019-12-12 DIAGNOSIS — E039 Hypothyroidism, unspecified: Secondary | ICD-10-CM | POA: Insufficient documentation

## 2019-12-12 DIAGNOSIS — M79671 Pain in right foot: Secondary | ICD-10-CM

## 2019-12-12 NOTE — Progress Notes (Signed)
[facility] - [facility] [place] [facility]                       Date of Exam: 12/12/2019 2:54 PM        Patient ID: Claudia Adams is a 74 y.o. female.  Attending Physician: Larna Daughters, PA        Chief Complaint:    Chief Complaint   Patient presents with    Anxiety    Rheumatoid Arthritis    Hypothyroidism    Diabetes    Hypertension    Hyperlipidemia               HPI:    Here today to establish care. She recently moved back to the area from [place] to live closer to her family.     1. TYPE II DIABETES MELLITUS  - First diagnosed with this 25 years ago   - Was on oral medications and changed to insulin a few months prior  - Last A1C: 6.5 (about three months ago)   - On Tresbiba 15 U daily and Ozempic one day per week   - Followed by endocrinology   - Has had the pneumonia vaccine and COVID vaccine   - Has neuropathy in feet for which she takes gabapentin 300 mg TID  - Taking Livalo 1 mg daily for HLD    2. HYPOTHYROIDISM:   - Taking Synthroid 25 mcg daily   - Last TSH about three months ago   - Denies palpitations, cold/heat intolerance, constipation    3. HYPERTENSION  - Taking Losartan-HCTZ 100-25 mg  - Has not been monitoring her BP at home regularly   - Denies chest pain, palpitations, lower extremity edema or SOB  - Tolerating her medication well  - No history of heart disease  - Had a nuclear stress test and carotid doppler recently which were all normal    4. RHEUMATOID ARTHRITIS  - First diagnosed about 15 years ago   - Currently on Remicade infusions every 6 weeks  - Last infusion about six weeks ago  - Also uses diclofenac PRN, hydroxychloroquine daily, methotrexate weekly   - Tramadol is PRN for her pain  - Would like to try to schedule an infusion as soon as she can    5. ANXIETY  - Takes Lexapro 20 mg daily and Abilify 2 mg daily  - Previously followed by psychiatry in [place]  - Using Ativan 1 mg PRN and states her panic attacks have recently been better controlled  - Using  Trazodone 50 mg nightly as needed  - Lost her husband a few years ago   - Recently relocated to be closer to family     6. SHOULDER PAIN  - S/p rotator cuff surgery in right shoulder in 2019  - Takes tapentadol ER 50 mg BID  - Plans to establish care with orthopedist in the area    7. FOOT INJURY  - A couple of weeks ago, iron and ironing board fell on patients right foot  - States she went to [facility] and had X-rays done, which were negative for fracture  - Still having foot swelling and bruising  - Would like to see an orthopedist for this            Problem List:    Patient Active Problem List   Diagnosis    Seasonal allergic rhinitis    Rheumatoid arthritis    Type 2 diabetes  mellitus with diabetic polyneuropathy, with long-term current use of insulin    Essential hypertension    Hyperlipidemia, mixed    Hypothyroidism, unspecified type    Anxiety and depression             Current Meds:    Outpatient Medications Marked as Taking for the 12/12/19 encounter (Office Visit) with Lucian Baswell S, PA   Medication Sig Dispense Refill    Accu-Chek SmartView test strip       ARIPiprazole (ABILIFY) 2 MG tablet Take 2 mg by mouth daily      ASPIRIN 81 PO aspirin      BD Pen Needle Nano 2nd Gen 32G X 4 MM Misc       Diclofenac Sodium 1.5 % Solution Apply topically      escitalopram (LEXAPRO) 20 MG tablet Take 20 mg by mouth daily      fluticasone (FLONASE) 50 MCG/ACT nasal spray 1 spray by Nasal route daily      folic acid (FOLVITE) 1 MG tablet folic acid 1 mg tablet   TAKE 1 TABLET BY MOUTH EVERY DAY      gabapentin (NEURONTIN) 300 MG capsule gabapentin 300 mg capsule   TAKE 1 CAPSULE BY MOUTH THREE TIMES A DAY      hydroxychloroquine (PLAQUENIL) 200 MG tablet       inFLIXimab (REMICADE IV) Remicade      Insulin Degludec Evaristo Bury FlexTouch) 100 UNIT/ML Solution Pen-injector Tresiba FlexTouch U-100 insulin 100 unit/mL (3 mL) subcutaneous pen   INJECT 15 UNITS INTO THE SKIN DAILY      KLOR-CON M 10 MEQ tablet        levothyroxine (SYNTHROID) 25 MCG tablet       LORazepam (ATIVAN) 1 MG tablet lorazepam 1 mg tablet   TAKE 1 TABLET BY MOUTH TWICE A DAY AS NEEDED      losartan 100 MG TABS, hydroCHLOROthiazide 25 MG TABS Take by mouth daily      methotrexate 2.5 MG tablet       metoprolol succinate XL (TOPROL-XL) 50 MG 24 hr tablet metoprolol succinate ER 50 mg tablet,extended release 24 hr   TAKE 1 TABLET BY MOUTH EVERY DAY      Pitavastatin Magnesium 1 MG Tab Take by mouth      Semaglutide, 1 MG/DOSE, (Ozempic, 1 MG/DOSE,) 2 MG/1.5ML Solution Pen-injector Ozempic 1 mg/dose (2 mg/1.5 mL) subcutaneous pen injector   1 (ONE) MILLIGRAM UNDER SKIN WEEKLY      Tapentadol HCl 50 MG Tablet SR 12 hr Take by mouth      traMADol (ULTRAM) 50 MG tablet Take 50 mg by mouth every 6 (six) hours as needed for Pain      traZODone (DESYREL) 50 MG tablet trazodone 50 mg tablet   TAKE 1 (ONE) TABLET AT BEDTIME, AS NEEDED            Allergies:    Allergies   Allergen Reactions    Penicillin G              Past Surgical History:    Past Surgical History:   Procedure Laterality Date    SHOULDER SURGERY Right 2019    Rotator cuff           Family History:    History reviewed. No pertinent family history.        Social History:    Social History     Tobacco Use    Smoking status: Never Smoker    Smokeless  tobacco: Never Used   Substance Use Topics    Alcohol use: Not Currently    Drug use: Not on file          The following sections were reviewed this encounter by the provider:   Tobacco   Allergies   Meds   Problems   Med Hx   Surg Hx   Fam Hx              Vital Signs:    BP 135/82 (BP Site: Left arm, Patient Position: Sitting, Cuff Size: Medium)    Pulse 89    Ht 1.537 m (5' 0.5")    Wt 73 kg (161 lb)    SpO2 96%    BMI 30.93 kg/m          ROS:    Review of Systems   Constitutional: Negative for activity change, appetite change and chills.   HENT: Positive for rhinorrhea.    Eyes: Positive for itching.   Respiratory: Negative for  cough, shortness of breath and wheezing.    Cardiovascular: Negative for chest pain and palpitations.   Gastrointestinal: Negative for abdominal pain, constipation and diarrhea.   Genitourinary: Negative for dysuria, flank pain and frequency.   Musculoskeletal: Positive for arthralgias. Negative for back pain, myalgias and neck pain.   Skin: Negative for rash.   Neurological: Negative for dizziness, weakness and numbness.              Physical Exam:    Physical Exam  Constitutional:       Appearance: Normal appearance.   HENT:      Head: Normocephalic and atraumatic.   Cardiovascular:      Rate and Rhythm: Normal rate and regular rhythm.      Pulses: Normal pulses.      Heart sounds: Normal heart sounds.   Pulmonary:      Effort: Pulmonary effort is normal. No respiratory distress.      Breath sounds: Normal breath sounds.   Abdominal:      General: Abdomen is flat. Bowel sounds are normal.      Palpations: Abdomen is soft.   Musculoskeletal:      Cervical back: Normal range of motion and neck supple.      Comments: Right foot: Bruising and mild swelling over the lateral dorsal surface   Neurological:      General: No focal deficit present.      Mental Status: She is alert.   Psychiatric:         Mood and Affect: Mood normal.         Behavior: Behavior normal.         Thought Content: Thought content normal.              Assessment/Plan:    1. Type 2 diabetes mellitus with diabetic polyneuropathy, with long-term current use of insulin  - Comprehensive metabolic panel  - Hemoglobin A1C  - Ambulatory referral to Endocrinology    - Plan to refer to endocrinology for managment  - Continue working on low carbohydrate diet and regular exercise  - Discussed need for annual dilated retinal exam, daily foot checks, pneumonia vaccination and annual microalbumin screening  - Will check lab work and adjust medications as indicated    2. Essential hypertension  - BP well controlled at this time  - Plan to continue with the  present medication/dose  - Discussed signs/symptoms and possible etiologies including lifestyle and genetic factors  - Recommend  monitoring BP at home and working on a low salt diet  - No refills requested today    3. Rheumatoid arthritis involving multiple sites, unspecified whether rheumatoid factor present  - Ambulatory referral to Rheumatology    - Due for her next Remicade infusion  - Will refer to rheumatology for management and will try to help coordinate an appointment for her    4. Hyperlipidemia, mixed  - Lipid panel    - Discussed possible etiologies including lifestyle and genetic factors  - Recommend focusing on a low saturated fat (<14 g) and high soluble fiber diet (goal >20g daily)  - Recommend regular exercise    5. Hypothyroidism, unspecified type  - TSH  - T4, free    - Long term plan to refer to endocrinology for DM and thyroid management  - Stable at this time and happy with the current dosage  - Discussed signs/symptoms of hypothyroidism  - Will adjust dose based on today's results as indicated  - Instructed to take medication on an empty stomach, first thing in the morning  - Instructed to follow-up for recheck as discussed or sooner if palpitations, heat intolerance or unintentional weight loss develops    6. Seasonal allergic rhinitis due to pollen  - Discussed signs/symptoms of allergic rhinitis  - Avoid known triggers  - Start OTC antihistamines 2 weeks prior to allergy season  - Ok to use OTC nasal saline rinses    7. Anxiety and depression  - Ambulatory referral to Psychiatry    - Well controlled at this time and will refer to anxiety for further evaluation and medication management  - Will continue with the present medication and dosage  - Instructed to take medication daily and do not stop abruptly   - Discussed benefits of regular exercise, well balanced diet and importance of sleep hygiene    8. Right foot pain  - Recommended she continue with ice, compression and elevation  - Will  defer to orthopedist for further evaluation and management                Follow-up:    Return in about 3 months (around 03/12/2020) for Annual wellness exam, Medication follow-up.         Roiza Wiedel S Rudell Marlowe, PA

## 2019-12-13 ENCOUNTER — Telehealth (INDEPENDENT_AMBULATORY_CARE_PROVIDER_SITE_OTHER): Payer: Self-pay | Admitting: Physician Assistant

## 2019-12-13 ENCOUNTER — Encounter (INDEPENDENT_AMBULATORY_CARE_PROVIDER_SITE_OTHER): Payer: Self-pay

## 2019-12-13 LAB — HEMOGLOBIN A1C: Hemoglobin A1C: 6.2 % — ABNORMAL HIGH (ref 4.8–5.6)

## 2019-12-13 LAB — LIPID PANEL
Cholesterol / HDL Ratio: 2.6 ratio (ref 0.0–4.4)
Cholesterol: 191 mg/dL (ref 100–199)
HDL: 73 mg/dL (ref >39)
LDL Chol Calculated (NIH): 99 mg/dL (ref 0–99)
Triglycerides: 111 mg/dL (ref 0–149)
VLDL Calculated: 19 mg/dL (ref 5–40)

## 2019-12-13 LAB — COMPREHENSIVE METABOLIC PANEL
ALT: 16 IU/L (ref 0–32)
AST (SGOT): 28 IU/L (ref 0–40)
African American eGFR: 58 mL/min/{1.73_m2} — ABNORMAL LOW (ref >59)
Albumin/Globulin Ratio: 1.6 (ref 1.2–2.2)
Albumin: 4.1 g/dL (ref 3.7–4.7)
Alkaline Phosphatase: 120 IU/L — ABNORMAL HIGH (ref 39–117)
BUN / Creatinine Ratio: 7 — ABNORMAL LOW (ref 12–28)
BUN: 8 mg/dL (ref 8–27)
Bilirubin, Total: 0.7 mg/dL (ref 0.0–1.2)
CO2: 26 mmol/L (ref 20–29)
Calcium: 9.7 mg/dL (ref 8.7–10.3)
Chloride: 104 mmol/L (ref 96–106)
Creatinine: 1.09 mg/dL — ABNORMAL HIGH (ref 0.57–1.00)
Globulin, Total: 2.6 g/dL (ref 1.5–4.5)
Glucose: 122 mg/dL — ABNORMAL HIGH (ref 65–99)
Potassium: 4.7 mmol/L (ref 3.5–5.2)
Protein, Total: 6.7 g/dL (ref 6.0–8.5)
Sodium: 144 mmol/L (ref 134–144)
non-African American eGFR: 50 mL/min/{1.73_m2} — ABNORMAL LOW (ref >59)

## 2019-12-13 LAB — T4, FREE: T4, Free: 1.29 ng/dL (ref 0.82–1.77)

## 2019-12-13 LAB — TSH: TSH: 2.23 u[IU]/mL (ref 0.450–4.500)

## 2019-12-13 NOTE — Progress Notes (Signed)
Hello Claudia Adams,    Your electrolytes and liver function are normal. Your thyroid function is normal.     Your diabetes and cholesterol are very well controlled! Keep up the good work and continue with your medications as prescribed.    Your kidney function is slightly decreased at this time. Please make sure to drink at least 64 oz of water daily and please try to avoid all over the counter antiinflammatories (like Aleve and Advil) which can hurt your kidneys. We will also request records from your previous provider to see if this is a change in your normal kidney function.     I would like to recheck this for you in three months at our planned follow-up visit. Please let me know if you have any questions or concerns.    Have a great day,  Yannis Gumbs, PA-C

## 2019-12-13 NOTE — Telephone Encounter (Signed)
Pt called requesting referral for orthopedics for her foot. She states she discussed her foot pain in her NP appt yesterday. Please advise. Thank you.

## 2019-12-13 NOTE — Telephone Encounter (Signed)
Sent pt message on Mychart with ortho list. Spoke w/ patient via phone and pt reports she did actually have the ortho list already that we had given to her at her appointment. She will call one of those offices to schedule. No further questions/concerns at this time.

## 2019-12-17 ENCOUNTER — Telehealth (INDEPENDENT_AMBULATORY_CARE_PROVIDER_SITE_OTHER): Payer: Self-pay | Admitting: Physician Assistant

## 2019-12-17 MED ORDER — FOLIC ACID 1 MG PO TABS
ORAL_TABLET | ORAL | 1 refills | Status: DC
Start: 2019-12-17 — End: 2020-06-16

## 2019-12-17 MED ORDER — FLUTICASONE PROPIONATE 50 MCG/ACT NA SUSP
1.00 | Freq: Every day | NASAL | 1 refills | Status: DC
Start: 2019-12-17 — End: 2020-09-15

## 2019-12-17 MED ORDER — GABAPENTIN 300 MG PO CAPS
ORAL_CAPSULE | ORAL | 0 refills | Status: DC
Start: 2019-12-17 — End: 2020-05-13

## 2019-12-17 MED ORDER — ACCU-CHEK SMARTVIEW VI STRP
ORAL_STRIP | 11 refills | Status: DC
Start: 2019-12-17 — End: 2021-03-20

## 2019-12-17 NOTE — Addendum Note (Signed)
Addended by: Sydnee Levans on: 12/17/2019 10:32 AM     Modules accepted: Orders

## 2019-12-17 NOTE — Addendum Note (Signed)
Addended byUrbano Heir K. on: 12/17/2019 12:35 PM     Modules accepted: Orders

## 2019-12-17 NOTE — Telephone Encounter (Signed)
Requested Rx sent electronically. Please let the patient know if she's expecting a call back. Thank you.

## 2019-12-17 NOTE — Telephone Encounter (Signed)
Patient called stating she was waiting for her medication refills since her NP appt on 04/07. She said she is completely out of test strips and needs them as soon as possible.    She requested a refill for the following meds to be send to the [facility] on file:  Accu-Check SmartView Test Strip  Folic acid 1 mg  Gabapentin 300 mg  Flonase nasal spray    Patient also stated she has the medical records from her old doctor the [facility] requested on a CD for her.

## 2019-12-31 ENCOUNTER — Other Ambulatory Visit: Payer: Self-pay | Admitting: Rheumatology

## 2020-01-07 ENCOUNTER — Encounter (INDEPENDENT_AMBULATORY_CARE_PROVIDER_SITE_OTHER): Payer: Self-pay

## 2020-01-07 ENCOUNTER — Other Ambulatory Visit (INDEPENDENT_AMBULATORY_CARE_PROVIDER_SITE_OTHER): Payer: Self-pay | Admitting: Physician Assistant

## 2020-01-07 ENCOUNTER — Telehealth (INDEPENDENT_AMBULATORY_CARE_PROVIDER_SITE_OTHER): Payer: Self-pay | Admitting: Physician Assistant

## 2020-01-07 DIAGNOSIS — Z794 Long term (current) use of insulin: Secondary | ICD-10-CM

## 2020-01-07 MED ORDER — SEMAGLUTIDE (1 MG/DOSE) 2 MG/1.5ML SC SOPN
1.00 mg | PEN_INJECTOR | SUBCUTANEOUS | 2 refills | Status: DC
Start: 2020-01-07 — End: 2020-05-21

## 2020-01-07 NOTE — Telephone Encounter (Signed)
You saw pt last in 12/12/19 with well controlled BG, but I saw that she should be following Endo mostly for her diabetes. Do you still want me to send refill?

## 2020-01-07 NOTE — Telephone Encounter (Signed)
She is going to be seeing endocrine for her DM but recently moved to the area. I'm happy to refill any medications she needs until she can get in with the specialist.

## 2020-01-07 NOTE — Telephone Encounter (Signed)
Rx sent electronically.  

## 2020-01-07 NOTE — Telephone Encounter (Signed)
Please refill the Ozempic pen only. Pt uses 1mg  injection weekly. I sent her Endo list via Mychart as well.

## 2020-01-07 NOTE — Telephone Encounter (Signed)
Patient is requesting a medication refill for their Ozempic pen.    Please send refill to the [facility] on file.    Thanks

## 2020-01-07 NOTE — Addendum Note (Signed)
Addended byUrbano Heir K. on: 01/07/2020 02:05 PM     Modules accepted: Orders

## 2020-02-12 ENCOUNTER — Telehealth (INDEPENDENT_AMBULATORY_CARE_PROVIDER_SITE_OTHER): Payer: Self-pay | Admitting: Physician Assistant

## 2020-02-12 MED ORDER — HYDROXYCHLOROQUINE SULFATE 200 MG PO TABS
200.00 mg | ORAL_TABLET | Freq: Every day | ORAL | 0 refills | Status: AC
Start: 2020-02-12 — End: 2020-03-13

## 2020-02-12 MED ORDER — METOPROLOL SUCCINATE ER 50 MG PO TB24
ORAL_TABLET | ORAL | 0 refills | Status: DC
Start: 2020-02-12 — End: 2020-03-11

## 2020-02-12 NOTE — Telephone Encounter (Signed)
Patient called. Patient requested 2 Rx's: metoprolol succinate XL (TOPROL-XL) 50 MG 24 hr tablet and hydroxychloroquine (PLAQUENIL) 200 MG tablet. Patient is due for PE in about a month, per notes from last office visit. Will schedule PE. Please send temp refill to [facility] Thanks! GT

## 2020-02-12 NOTE — Addendum Note (Signed)
Addended by: Sydnee Levans on: 02/12/2020 11:51 AM     Modules accepted: Orders

## 2020-02-12 NOTE — Telephone Encounter (Signed)
30 day supply of both medications sent in to preferred pharmacy. Please let pt know, if she is expecting a call back.

## 2020-02-21 ENCOUNTER — Other Ambulatory Visit (INDEPENDENT_AMBULATORY_CARE_PROVIDER_SITE_OTHER): Payer: Self-pay | Admitting: Physician Assistant

## 2020-02-21 DIAGNOSIS — E1142 Type 2 diabetes mellitus with diabetic polyneuropathy: Secondary | ICD-10-CM

## 2020-02-22 MED ORDER — BD PEN NEEDLE NANO 2ND GEN 32G X 4 MM MISC
1 refills | Status: DC
Start: 2020-02-22 — End: 2020-07-29

## 2020-03-06 ENCOUNTER — Other Ambulatory Visit: Payer: Self-pay | Admitting: Sports Medicine"

## 2020-03-11 ENCOUNTER — Other Ambulatory Visit (INDEPENDENT_AMBULATORY_CARE_PROVIDER_SITE_OTHER): Payer: Self-pay | Admitting: Physician Assistant

## 2020-03-11 NOTE — Telephone Encounter (Signed)
Can you please give her a call to find out if she established with a rheumatologist yet? I received a refill for her Hydroxychloroquine.

## 2020-03-11 NOTE — Telephone Encounter (Signed)
#  1 [id number]

## 2020-03-12 ENCOUNTER — Ambulatory Visit (INDEPENDENT_AMBULATORY_CARE_PROVIDER_SITE_OTHER): Payer: Medicare Other | Admitting: Physician Assistant

## 2020-03-12 ENCOUNTER — Encounter (INDEPENDENT_AMBULATORY_CARE_PROVIDER_SITE_OTHER): Payer: Self-pay | Admitting: Physician Assistant

## 2020-03-12 VITALS — BP 142/84 | HR 98 | Temp 97.3°F | Wt 164.0 lb

## 2020-03-12 DIAGNOSIS — M069 Rheumatoid arthritis, unspecified: Secondary | ICD-10-CM

## 2020-03-12 DIAGNOSIS — E039 Hypothyroidism, unspecified: Secondary | ICD-10-CM

## 2020-03-12 DIAGNOSIS — Z794 Long term (current) use of insulin: Secondary | ICD-10-CM

## 2020-03-12 DIAGNOSIS — F32A Depression, unspecified: Secondary | ICD-10-CM

## 2020-03-12 DIAGNOSIS — E1142 Type 2 diabetes mellitus with diabetic polyneuropathy: Secondary | ICD-10-CM

## 2020-03-12 DIAGNOSIS — F419 Anxiety disorder, unspecified: Secondary | ICD-10-CM

## 2020-03-12 DIAGNOSIS — Z01818 Encounter for other preprocedural examination: Secondary | ICD-10-CM

## 2020-03-12 DIAGNOSIS — F329 Major depressive disorder, single episode, unspecified: Secondary | ICD-10-CM

## 2020-03-12 DIAGNOSIS — E782 Mixed hyperlipidemia: Secondary | ICD-10-CM

## 2020-03-12 DIAGNOSIS — I10 Essential (primary) hypertension: Secondary | ICD-10-CM

## 2020-03-12 MED ORDER — TRESIBA FLEXTOUCH 100 UNIT/ML SC SOPN
15.00 [IU] | PEN_INJECTOR | Freq: Every day | SUBCUTANEOUS | 1 refills | Status: DC
Start: 2020-03-12 — End: 2020-05-21

## 2020-03-12 MED ORDER — LOSARTAN POTASSIUM-HCTZ 100-25 MG PO TABS
1.0000 | ORAL_TABLET | Freq: Every day | ORAL | 1 refills | Status: DC
Start: 2020-03-12 — End: 2020-06-10

## 2020-03-12 NOTE — Progress Notes (Deleted)
 Claudia Adams is a 74 y.o. female who presents today for a Medicare Annual Wellness Visit.     Health Risk Assessment     During the past month, how would you rate your general health?:     Which of the following tasks can you do without assistance - drive or take the bus alone; shop for groceries or clothes; prepare your own meals; do your own housework/laundry; handle your own finances/pay bills; eat, bathe or get around your home?:     Which of the following problems have you been bothered by in the past month - dizzy when standing up; problems using the phone; feeling tired or fatigued; moderate or severe body pain?:    Do you exercise for about 20 minutes 3 or more days per week?:     During the past month was someone available to help if you needed and wanted help?  For example, if you felt nervous, lonely, got sick and had to stay in bed, needed someone to talk to, needed help with daily chores or needed help just taking care of yourself.:    Do you always wear a seat belt?:    Do you have any trouble taking medications the way you have been told to take them?:    Have you been given any information that can help you with keeping track of your medications?:    Do you have trouble paying for your medications?:    Have you been given any information that can help you with hazards in your house, such as scatter rugs, furniture, etc?:    Do you feel unsteady when standing or walking?:    Do you worry about falling?:    Have you fallen two or more times in the past year?:    Did you suffer any injuries from your falls in the past year?:      Additional Concerns    Patient Care Team:  Oppelt, Wayne Both, PA as PCP - General (Physician Assistant)    Past Medical History:   Diagnosis Date   . Anxiety    . Diabetes mellitus    . Disorder of thyroid    . Hypertension    . Rheumatoid arthritis    . Seasonal allergic rhinitis      Past Surgical History:   Procedure Laterality Date   . SHOULDER SURGERY Right 2019    Rotator  cuff     Allergies   Allergen Reactions   . Penicillin G       Current Outpatient Medications   Medication Sig Dispense Refill   . Accu-Chek SmartView test strip Monitor blood glucose once daily, Dx: E11.42 100 each 11   . ARIPiprazole (ABILIFY) 2 MG tablet Take 2 mg by mouth daily     . ASPIRIN 81 PO aspirin     . BD Pen Needle Nano 2nd Gen 32G X 4 MM Misc Use once daily as directed. 100 each 1   . Diclofenac Sodium 1.5 % Solution Apply topically     . escitalopram (LEXAPRO) 20 MG tablet Take 20 mg by mouth daily     . fluticasone (FLONASE) 50 MCG/ACT nasal spray 1 spray by Nasal route daily 3 Bottle 1   . folic acid (FOLVITE) 1 MG tablet TAKE 1 TABLET BY MOUTH EVERY DAY 90 tablet 1   . gabapentin (NEURONTIN) 300 MG capsule TAKE 1 CAPSULE BY MOUTH THREE TIMES A DAY 270 capsule 0   . hydroxychloroquine (PLAQUENIL) 200 MG  tablet Take 1 tablet (200 mg total) by mouth daily 30 tablet 0   . inFLIXimab (REMICADE IV) Remicade     . Insulin Degludec Evaristo Bury FlexTouch) 100 UNIT/ML Solution Pen-injector Evaristo Bury FlexTouch U-100 insulin 100 unit/mL (3 mL) subcutaneous pen   INJECT 15 UNITS INTO THE SKIN DAILY     . KLOR-CON M 10 MEQ tablet      . levothyroxine (SYNTHROID) 25 MCG tablet      . LORazepam (ATIVAN) 1 MG tablet lorazepam 1 mg tablet   TAKE 1 TABLET BY MOUTH TWICE A DAY AS NEEDED     . LORazepam (ATIVAN) 2 MG tablet      . losartan 100 MG TABS, hydroCHLOROthiazide 25 MG TABS Take by mouth daily     . methotrexate 2.5 MG tablet      . metoprolol succinate XL (TOPROL-XL) 50 MG 24 hr tablet TAKE 1 TABLET BY MOUTH EVERY DAY 30 tablet 0   . Pitavastatin Magnesium 1 MG Tab Take by mouth     . Semaglutide, 1 MG/DOSE, 2 MG/1.5ML Solution Pen-injector Inject 1 mg into the skin once a week 4 pen 2   . Tapentadol HCl 50 MG Tablet SR 12 hr Take by mouth     . traMADol (ULTRAM) 50 MG tablet Take 50 mg by mouth every 6 (six) hours as needed for Pain     . traZODone (DESYREL) 50 MG tablet trazodone 50 mg tablet   TAKE 1 (ONE)  TABLET AT BEDTIME, AS NEEDED       No current facility-administered medications for this visit.      Social History     Tobacco Use   . Smoking status: Never Smoker   . Smokeless tobacco: Never Used   Vaping Use   . Vaping Use: Never used   Substance Use Topics   . Alcohol use: Not Currently   . Drug use: Not on file      No family history on file.     The following sections were reviewed this encounter by the provider:        Hospitalizations  {AWV Hospitalizations:36078}    Depression Screening    See related Activity or Flowsheet    Functional Ability    Falls Risk:  {AWV Falls Risk:36073}  Hearing:  {AWV Hearing:36074}  Exercise:  {AWV Exercise:36075}  ADL's:   Bathing - {AWV ADL Assistance:36076}   Dressing - {AWV ADL Assistance:36076}   Mobility - {AWV ADL Assistance:36076}   Transfer - {AWV ADL Assistance:36076}   Eating - {AWV ADL Assistance:36076}}   Toileting - {AWV ADL Assistance:36076}   ADL assistance {AWV ADL Provided By:36079}    Discussion of Advance Directives: {AWVAdvanceDirective:35027}     Assessment    There were no vitals taken for this visit.     {Vision Screening (required for IPPE only):40557}  {Screening EKG (IPPE only):40558}    {Optional Reference  Other examination as deemed appropriate, based on the beneficiarys medical and social history. If needed, you can use .PHYSEXAM or a personal SmartPhrase to document the examination.  :39607}    Evaluation of Cognitive Function    Mood/affect: {PSYCH AFFECT/MOOD:23682}  Appearance: {.:20716}  Family member/caregiver input: {Family input:33292}    { AWV Mini-Cog (optional reference)    Step 1 Three word registration  Version 1- Banana; Sunrise; Chair  Version 2- Leader; Season; Table    Step 2 Clock drawing    Step 3 Three word recall    Scoring  - word  recall= 0-3 points (1 point for each)  - clock draw= 0 or 2 points (normal clock = 2 points)  Total= 0-5 points (< 4 points may indicate need for further evaluation)    :39607}    AWV Mini-Cog  Result:  {AWV Mini-Cog:36077}    Assessment/Plan    VACCINE HISTORY:  - Tdap (every 10 years): Unknown. Not indicated after 74 yrs old.  - COVID-19 vaccine: Received both doses of Pfizer     After age 59:  - Shingrix:    After age 53:  - Pneumovax: 2001  - Prevnar:    SCREENING EXAMS:     After age 108:  - Mammogram:     After age 61:   - Colon cancer screening:     Born between 1945-1965:   - Hep C screening:     After age 30:  - Bone density exam:    There are no diagnoses linked to this encounter.    High BMI Follow Up   {MUPLANHIGHBMIFU:21026391:p}    Sydnee Levans, RN  03/12/2020        {Optional Reference Table    Preventive Service (Medicare Frequency)   USPSTF Frequency   ICD-10     Body Mass Index     Annually The USPSTF recommends screening all adults for obesity. Clinicians should offer or refer patients with a body mass index of 30 kg/m2 or higher to intensive, multicomponent behavioral interventions.      Blood Pressure   Annually     The USPSTF recommends screening for high blood pressure in adults aged 18 years or older. The USPSTF recommends obtaining measurements outside of the clinical setting for diagnostic confirmation before starting treatment.      Vision Screening   Every 1-2 years     No current recommendations      Cholesterol Testing   Once every 5 years    The USPSTF strongly recommends screening men age 81 years and older for lipid disorders.      Z13.6 Screening for cardiovascular disorders     Diabetes Screening   One screening every 6 months for Medicare beneficiaries diagnosed with pre-diabetes   One screening per year if previously tested but not diagnosed with pre-diabetes or if never tested The USPSTF recommends screening for abnormal blood glucose as part of cardiovascular risk assessment in adults aged 7 to 70 years who are overweight or obese. Clinicians should offer or refer patients with abnormal blood glucose to intensive behavioral counseling interventions to promote a  healthful diet and physical activity.        Z13.1 Screening for diabetes mellitus     Osteoporosis Screening   (Bone Density Measurement)    Every 2 years   Who is covered- Women determined by their physician or qualified non-physician practitioner (NPP) to be estrogen deficient and at clinical risk for osteoporosis; Individuals with vertebral abnormalities; Individuals getting (or expecting to get) glucocorticoid therapy for more than 3 months; Individuals with primary hyperparathyroidism; Individuals being monitored to assess response to U.S. Food and Drug Administration (FDA)-approved osteoporosis drug therapy  The USPSTF recommends screening for osteoporosis in women age 69 years and older and in younger women whose fracture risk is equal to or greater than that of a 74 year old white woman who has no additional risk factors.          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      Z12.31 Screening mammogram for malignant neoplasm of breast     Cervical Cancer Screening with HPV Tests    Once every 5 years; All asymptomatic female Medicare beneficiaries aged 38 to 10 years                 Age 76-65 = Perform cytologic and HPV cotesting every 5 years (preferred), or perform cytology testing alone every 3 years (acceptable); Age 50+ = Discontinue screening if there has been an adequate number of negative screening results previously (3 consecutive negative cytologic tests or 2 consecutive negative cotests in the past 10 years, with the most recent  test in the past 5 years) and if there is no history of HSIL, adenocarcinoma in situ, or cancer)  Z11.51 Screening for HPV and either Z01.411 Gynecological exam with abnormal findings; also code abnormal findings or Z01.419 without abnormal findings     Screening Pap Tests   Annually if at high risk for developing cervical or vaginal cancer or childbearing age with abnormal Pap test within past 3 years; Every 2 years for women at normal risk  For high risk   Z72.51-Z72.53 High-risk heterosexual, homosexual, or bisexual behavior   Z77.29  Exposure to toxic substance   Z77.9 Other contact w/ and (suspected) exposures hazardous to health   Z91.89 Other personal risk factors, NEC   Z92.89 Personal Hx of other medical treatment    For low risk   Z01.411 Gynecological exam with abnormal findings; also code abnormal findings   Z01.419 without abnormal findings   Z12.4  Cervical cancer screening   Z12.72 Screening malignant neoplasm vagina   Z12.79  Screening for GU neoplasm   Z12.89  Pelvic screening for cancer     Colorectal Cancer Screening   Cologuard Multitarget Stool DNA (sDNA) Test - once every 3 years; not covered for high risk   Screening Fecal Occult Blood Test (FOBT) - every year   Screening flexible sigmoidoscopy (FS) - once every 48 months (unless the criteria for high risk of developing colorectal cancer has not been met and a screening colonoscopy has been performed in the last 10 years, in which case Medicare may cover a screening FS only after at least 119 months following the month the colonoscopy was performed); for high risk individuals, once every 48 months   Screening colonoscopy - every 10 years, or 48 months after a previous sigmoidoscopy; for high risk, once every 24 months (unless an FS has been performed and then Medicare may cover a screening colonoscopy only after at least 47 months)   Screening barium exema (as an alternative to covered screening flexible sigmoidoscopy or  colonoscopy) - every 48 months, or every 24 months for high risk individuals     The USPSTF recommends screening for colorectal cancer starting at age 90 years and continuing until age 64 years.      Depression Screening   Frequency- annually The USPSTF recommends screening for depression in the general adult population, including pregnant and postpartum women. Screening should be implemented with adequate systems in place to ensure accurate diagnosis,  effective treatment, and appropriate follow-up.      Sexually Transmitted Diseases (STDs) & HIV Screening   Annually for Medicare beneficiaries between the ages of 35 and 78 without regard to perceived risk   Annually for Medicare beneficiaries younger than 1 and adults older than 68 who are at increased risk for HIV infection   For Medicare beneficiaries who are pregnant, 3 times per pregnancy- first, when a woman is diagnosed with pregnancy; second, during the third trimester; third, at labor, if ordered by a woman's clinician           The USPSTF recommends that clinicians screen for HIV infection in adolescents and adults ages 66 to 98 years. Younger adolescents and older adults who are at increased risk should also be screened.          Z11.4 Screening for HIV    And if high risk   Z72.51-Z72.53 High-risk heterosexual, homosexual, or bisexual behavior   Z72.89 Other problems related to lifestyle     Alcohol Misuse Screening   [phone] - annual alcohol misuse screening, 15 mins - annually   [phone] - brief face-to-face behavioral counseling for alcohol misuse, 15 mins - for those who screen positive, 4 times per year The USPSTF recommends that clinicians screen adults age 57 years or older for alcohol misuse and provide persons engaged in risky or hazardous drinking with brief behavioral counseling interventions to reduce alcohol misuse.      Immunizations  Prevnar 13  Pneumococcal 23  Influenza  Hepatitis B    (Prevnar 13- 1 dose at age 65+  Pneumovax 32- 1  dose one year after Prevnar 13  Influenza- Annually  Hepatitis B- One series of 3 injections for those at risk.)             No current recommendations      Advance Directive   Once; update as needed           Medical Nutrition Therapy   As necessary for diabetes or renal disease           Smoking Cessation Counseling   [phone] - smoking and tobacco-use cessation counseling visit; intermediate, greater than 3 minutes up to 10 minutes   99407 - smoking and tobacco-use cessation counseling visit; intensive, greater than 10 minutes   Frequency- two cessation attempts per year. Each attempt may include a maximum of 4 intermediate or intensive sessions, with the total annual benefit covering up to 8 sessions per year.     The USPSTF recommends that clinicians ask all adults about tobacco use, advise them to stop using tobacco, and provide behavioral interventions and U.S. Food and Drug Administration (FDA)oapproved pharmacotherapy for cessation to adults who use tobacco.        F17.2- Nicotine dependence   Z87.891 Personal Hx of nicotine dependence, unspec., uncomplicated     Glaucoma Screening  . Z13.5, annually for Medicare beneficiaries in at least one of the following high risk categories- individuals with diabetes mellitus, individuals with a family history of glaucoma, African-Americans aged 2 and older, or Hispanic-Americans aged 58 and older   No current recommendations      Hepatitis C Virus (HCV) Screening   Z72.89 and F19.20   G0472 - Hepatitis C antibody screening, for individual at high risk and other covered indication(s)   Who is covered- high risk for HCV infection, born between [birth year] and 1965, or had blood transfusion before 1992.   Annually only for high risk Medicare beneficiaries with  continued illicit injection drug use since the prior negative screening test   Once in a lifetime for Medicare beneficiaries born between 1945 and 1965 who are not considered high risk   Initial screening for  Medicare beneficiaries, regardless of birth year, who had a blood transfusion before 1992 and beneficiaries with a current or past history of illicit injection drug use. The USPSTF recommends screening for hepatitis C virus (HCV) infection in persons at high risk for infection. The USPSTF also recommends offering one-time screening for HCV infection to adults born between 65 and 1965.        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 First year- before the first lung cancer LDCT screening, Medicare beneficiaries must receive a counseling and shared decision making visit. Subsequent years- the Medicare beneficiary must receive a written order furnished during an appropriate visit with a physician or NPP   The USPSTF recommends annual screening for lung cancer with low-dose computed tomography in adults ages 58 to 73 years who have a 30 pack-year smoking history and currently smoke or have quit within the past 15 years. Screening should be discontinued once a person has not smoked for 15 years or develops a health problem that substantially limits life expectancy or the ability or willingness to have curative lung surgery.      F62.130 Personal Hx of nicotine dependence   F17.210  Cigarette smoker   F17.211  Nicotine dependence, in remission   F17.213  Nicotine dependence, with withdrawal   F17.218  Nicotine dependence, with other nicotine-induced  disorder   F17.219  Nicotine dependence, with nicotine-induced disorder      References  CMS Medicare Preventive Services ICN [phone] December 2017  [place] Preventive Services Task Force A and B Recommendations September 2017    :39607}

## 2020-03-12 NOTE — Progress Notes (Signed)
[facility] [place] [facility]                       Date of Exam: 03/12/2020 11:50 AM        Patient ID: Claudia Adams is a 74 y.o. female.  Attending Physician: Larna Daughters, PA        Chief Complaint:    Chief Complaint   Patient presents with    Pre-op Exam               HPI:    Referred here today for medical consultation and clearance for right total knee arthroplasty surgery, scheduled on July, 23, with Dr. Harlene Salts     For risk factors, reports no history of surgical complications, no easy bleeding tendency, no history of blood clots, and no history of allergic reaction to anesthetic agents, latex or medical tape. For current symptoms, reports no unusual fatigue, no chest pain, no palpitations, no unexplained syncope, and no shortness of breath.    1. RIGHT KNEE PAIN:   - Swelling and pain secondary to her RA  - Plans for surgery as above    2. TYPE II DIABETES MELLITUS  - First diagnosed with this 25 years ago   - Was on oral medications and changed to insulin a few months prior  - Last A1C: 6.2 (12/2019)   - On Tresbiba 15 U daily and Ozempic one day per week   - Followed by endocrinology and has an appointment in August to get established  - Has had the pneumonia vaccine and COVID vaccine   - Has neuropathy in feet for which she takes gabapentin 300 mg TID  - Taking Livalo 1 mg daily for HLD    3. HYPOTHYROIDISM:   - Taking Synthroid 25 mcg daily   - Last TSH about three months ago   - Denies palpitations, cold/heat intolerance, constipation    4. HYPERTENSION  - Taking Losartan-HCTZ 100-25 mg  - Has not been monitoring her BP at home regularly   - Denies chest pain, palpitations, lower extremity edema or SOB  - Tolerating her medication well  - No history of heart disease  - Had a nuclear stress test and carotid doppler recently which were all normal    5. RHEUMATOID ARTHRITIS  - First diagnosed about 15 years ago   - Followed now by rheumatology in our  area, does not recall the providers name  - Currently on Remicade infusions every 6 weeks  - Last infusion about six weeks ago  - Also uses diclofenac PRN, hydroxychloroquine daily, methotrexate weekly   - Tramadol is PRN for her pain    6. ANXIETY  - Takes Lexapro 20 mg daily and Abilify 2 mg daily  - Previously followed by psychiatry in [place] and still plans to establish with a prior in our area  - Using Ativan 1 mg PRN and states her panic attacks have recently been better controlled  - Using Trazodone 50 mg nightly as needed  - Lost her husband a few years ago   - Recently relocated to be closer to family             Problem List:    Patient Active Problem List   Diagnosis    Seasonal allergic rhinitis    Rheumatoid arthritis    Type 2 diabetes mellitus with diabetic polyneuropathy, with long-term current use of insulin    Essential hypertension  Hyperlipidemia, mixed    Hypothyroidism, unspecified type    Anxiety and depression             Current Meds:    Outpatient Medications Marked as Taking for the 03/12/20 encounter (Office Visit) with Jacey Pelc S, PA   Medication Sig Dispense Refill    Accu-Chek SmartView test strip Monitor blood glucose once daily, Dx: E11.42 100 each 11    ARIPiprazole (ABILIFY) 2 MG tablet Take 2 mg by mouth daily      ASPIRIN 81 PO aspirin      BD Pen Needle Nano 2nd Gen 32G X 4 MM Misc Use once daily as directed. 100 each 1    Diclofenac Sodium 1.5 % Solution Apply topically      escitalopram (LEXAPRO) 20 MG tablet Take 20 mg by mouth daily      fluticasone (FLONASE) 50 MCG/ACT nasal spray 1 spray by Nasal route daily 3 Bottle 1    folic acid (FOLVITE) 1 MG tablet TAKE 1 TABLET BY MOUTH EVERY DAY 90 tablet 1    gabapentin (NEURONTIN) 300 MG capsule TAKE 1 CAPSULE BY MOUTH THREE TIMES A DAY 270 capsule 0    hydroxychloroquine (PLAQUENIL) 200 MG tablet Take 1 tablet (200 mg total) by mouth daily 30 tablet 0    inFLIXimab (REMICADE IV) Remicade      Insulin Degludec  Evaristo Bury FlexTouch) 100 UNIT/ML Solution Pen-injector Inject 15 Unit into the skin daily 5 pen 1    KLOR-CON M 10 MEQ tablet       levothyroxine (SYNTHROID) 25 MCG tablet       LORazepam (ATIVAN) 1 MG tablet lorazepam 1 mg tablet   TAKE 1 TABLET BY MOUTH TWICE A DAY AS NEEDED      methotrexate 2.5 MG tablet       metoprolol succinate XL (TOPROL-XL) 50 MG 24 hr tablet TAKE 1 TABLET BY MOUTH EVERY DAY 30 tablet 0    Pitavastatin Magnesium 1 MG Tab Take by mouth      Semaglutide, 1 MG/DOSE, 2 MG/1.5ML Solution Pen-injector Inject 1 mg into the skin once a week 4 pen 2    Tapentadol HCl 50 MG Tablet SR 12 hr Take by mouth      traMADol (ULTRAM) 50 MG tablet Take 50 mg by mouth every 6 (six) hours as needed for Pain      traZODone (DESYREL) 50 MG tablet trazodone 50 mg tablet   TAKE 1 (ONE) TABLET AT BEDTIME, AS NEEDED      [DISCONTINUED] Insulin Degludec Evaristo Bury FlexTouch) 100 UNIT/ML Solution Pen-injector Tresiba FlexTouch U-100 insulin 100 unit/mL (3 mL) subcutaneous pen   INJECT 15 UNITS INTO THE SKIN DAILY      [DISCONTINUED] losartan 100 MG TABS, hydroCHLOROthiazide 25 MG TABS Take by mouth daily            Allergies:    Allergies   Allergen Reactions    Penicillin G              Past Surgical History:    Past Surgical History:   Procedure Laterality Date    SHOULDER SURGERY Right 2019    Rotator cuff           Family History:    History reviewed. No pertinent family history.        Social History:    Social History     Tobacco Use    Smoking status: Never Smoker    Smokeless tobacco: Never Used  Vaping Use    Vaping Use: Never used   Substance Use Topics    Alcohol use: Not Currently    Drug use: Not on file          The following sections were reviewed this encounter by the provider:   Tobacco   Allergies   Meds   Problems   Med Hx   Surg Hx   Fam Hx              Vital Signs:    BP 142/84 (BP Site: Right arm, Patient Position: Sitting, Cuff Size: Small)    Pulse 98    Temp 97.3 F (36.3 C)  (Temporal)    Wt 74.4 kg (164 lb)    SpO2 98%    BMI 31.50 kg/m          ROS:    Review of Systems   Constitutional: Negative for activity change, appetite change, chills, fever and unexpected weight change.   HENT: Negative for congestion, dental problem, ear pain, rhinorrhea and sore throat.    Eyes: Negative for redness, itching and visual disturbance.   Respiratory: Negative for cough, shortness of breath and wheezing.    Cardiovascular: Negative for chest pain, palpitations and leg swelling.   Gastrointestinal: Negative for abdominal pain, constipation, diarrhea, nausea and vomiting.   Endocrine: Negative for cold intolerance and heat intolerance.   Genitourinary: Negative for dysuria, frequency and vaginal discharge.   Musculoskeletal: Positive for arthralgias. Negative for back pain, myalgias and neck pain.   Skin: Negative for rash.   Neurological: Negative for dizziness, weakness and numbness.   Psychiatric/Behavioral: Negative for confusion and sleep disturbance. The patient is not nervous/anxious.               Physical Exam:    Physical Exam  Constitutional:       General: She is not in acute distress.     Appearance: Normal appearance.   HENT:      Head: Normocephalic and atraumatic.      Right Ear: Tympanic membrane, ear canal and external ear normal.      Left Ear: Tympanic membrane, ear canal and external ear normal.   Eyes:      Extraocular Movements: Extraocular movements intact.      Conjunctiva/sclera: Conjunctivae normal.      Pupils: Pupils are equal, round, and reactive to light.   Cardiovascular:      Rate and Rhythm: Normal rate and regular rhythm.      Pulses: Normal pulses.      Heart sounds: Normal heart sounds.   Pulmonary:      Effort: Pulmonary effort is normal.      Breath sounds: Normal breath sounds.   Abdominal:      General: Abdomen is flat. Bowel sounds are normal. There is no distension.      Palpations: Abdomen is soft.      Tenderness: There is no abdominal tenderness.    Musculoskeletal:         General: Normal range of motion.      Cervical back: Normal range of motion and neck supple.   Skin:     General: Skin is warm and dry.   Neurological:      General: No focal deficit present.      Mental Status: She is alert and oriented to person, place, and time.   Psychiatric:         Mood and Affect: Mood normal.  Thought Content: Thought content normal.         Judgment: Judgment normal.              Assessment/Plan:    1. Pre-op examination  - Prothrombin time/INR  - APTT  - Upper Respiratory Culture    - Overall low risk for the upcoming low risk procedure   - Pending lab work results she is medically cleared for surgery  - EKG: Normal sinus rhythm with PVC. Voltage criteria for LVH. Reviewed with the patient  - States she had a normal nuclear stress test and echocardiogram in 2019  - Instructed to avoid OTC NSAIDs and ASA seven days prior to surgery    2. Rheumatoid arthritis involving multiple sites, unspecified whether rheumatoid factor present    - Continue with surgery as planned for knee pain and swelling  - Plan to continue to follow with rheumatology  - Instructed to contact rheumatology for recommendations on her medication around the time of surgery    3. Type 2 diabetes mellitus with diabetic polyneuropathy, with long-term current use of insulin  - Hemoglobin A1C  - Comprehensive metabolic panel    - Well controlled at this time  - Plan to establish care with an endocrinologist next month  - Continue working on low carbohydrate diet and regular exercise  - Plan to continue with medications are prescribed    4. Essential hypertension  - ECG 12 lead  - CBC and differential    - BP well controlled at this time  - Plan to continue with the present medication/dose  - Discussed signs/symptoms and possible etiologies including lifestyle and genetic factors  - Recommend monitoring BP at home and working on a low salt diet    5. Hyperlipidemia, mixed  - Discussed possible  etiologies including lifestyle and genetic factors  - Recommend focusing on a low saturated fat (<14 g) and high soluble fiber diet (goal >20g daily)  - Recommend regular exercise    6. Hypothyroidism, unspecified type  - Stable at this time and happy with the current dosage  - Plan to establish care with an endocrinologist    7. Anxiety and depression  - Stable at this time  - Plans to establish with a psychologist in the area    Orders Placed This Encounter   Medications    losartan-hydrochlorothiazide (HYZAAR) 100-25 MG per tablet     Sig: Take 1 tablet by mouth daily     Dispense:  90 tablet     Refill:  1    Insulin Degludec Evaristo Bury FlexTouch) 100 UNIT/ML Solution Pen-injector     Sig: Inject 15 Unit into the skin daily     Dispense:  5 pen     Refill:  1                   Follow-up:    Return in about 3 months (around 06/12/2020) for Annual wellness exam, Medication follow-up.         Arnulfo Batson S Essam Lowdermilk, PA

## 2020-03-12 NOTE — Progress Notes (Deleted)
[facility] - [facility] [place] [facility]                       Date of Exam: 03/12/2020 10:30 AM        Patient ID: Claudia Adams is a 74 y.o. female.  Attending Physician: Larna Daughters, PA        Chief Complaint:    Chief Complaint   Patient presents with    Diabetes Follow-up    Hypothyroidism    Hypertension    Rheumatoid Arthritis    Anxiety               HPI:    Referred here today for medical consultation and clearance for Right total knee arthroplasty surgery, scheduled on 03/28/20, with Dr. Laural Benes.     For risk factors, reports no history of surgical complications, no easy bleeding tendency, no history of blood clots, and no history of allergic reaction to anesthetic agents, latex or medical tape.     For current symptoms, reports no unusual fatigue, no chest pain, no palpitations, no unexplained syncope, and no shortness of breath.    MEDICAL HISTORY    1. TYPE II DIABETES MELLITUS  - First diagnosed with this 25 years ago   - Was on oral medications and changed to insulin a few months prior  - Last A1C: 6.5 (about three months ago)   - On Tresbiba 15 U daily and Ozempic one day per week   - Followed by endocrinology   - Has had the pneumonia vaccine and COVID vaccine   - Has neuropathy in feet for which she takes gabapentin 300 mg TID  - Taking Livalo 1 mg daily for HLD    2. HYPOTHYROIDISM:   - Taking Synthroid 25 mcg daily   - Last TSH about three months ago   - Denies palpitations, cold/heat intolerance, constipation    3. HYPERTENSION  - Taking Losartan-HCTZ 100-25 mg  - Has not been monitoring her BP at home regularly   - Denies chest pain, palpitations, lower extremity edema or SOB  - Tolerating her medication well  - No history of heart disease  - Had a nuclear stress test and carotid doppler recently which were all normal    4. RHEUMATOID ARTHRITIS  - First diagnosed about 15 years ago   - Currently on Remicade infusions every 6 weeks  - Last infusion about six  weeks ago  - Also uses diclofenac PRN, hydroxychloroquine daily, methotrexate weekly   - Tramadol is PRN for her pain  - Would like to try to schedule an infusion as soon as she can    5. ANXIETY  - Takes Lexapro 20 mg daily and Abilify 2 mg daily  - Previously followed by psychiatry in [place]  - Using Ativan 1 mg PRN and states her panic attacks have recently been better controlled  - Using Trazodone 50 mg nightly as needed  - Lost her husband a few years ago   - Recently relocated to be closer to family             Problem List:    Patient Active Problem List   Diagnosis    Seasonal allergic rhinitis    Rheumatoid arthritis    Type 2 diabetes mellitus with diabetic polyneuropathy, with long-term current use of insulin    Essential hypertension    Hyperlipidemia, mixed    Hypothyroidism, unspecified type    Anxiety and depression  Current Meds:    No outpatient medications have been marked as taking for the 03/12/20 encounter (Office Visit) with Oppelt, Amy S, PA.          Allergies:    Allergies   Allergen Reactions    Penicillin G              Past Surgical History:    Past Surgical History:   Procedure Laterality Date    SHOULDER SURGERY Right 2019    Rotator cuff           Family History:    No family history on file.        Social History:    Social History     Tobacco Use    Smoking status: Never Smoker    Smokeless tobacco: Never Used   Haematologist Use: Never used   Substance Use Topics    Alcohol use: Not Currently    Drug use: Not on file          The following sections were reviewed this encounter by the provider:            Vital Signs:    There were no vitals taken for this visit.         ROS:    Review of Systems           Physical Exam:    Physical Exam         Assessment/Plan:    There are no diagnoses linked to this encounter.                Follow-up:    No follow-ups on file.         Amy S Oppelt, PA

## 2020-03-13 LAB — CBC AND DIFFERENTIAL
Baso(Absolute): 0 10*3/uL (ref 0.0–0.2)
Basos: 1 %
Eos: 2 %
Eosinophils Absolute: 0.2 10*3/uL (ref 0.0–0.4)
Hematocrit: 36.6 % (ref 34.0–46.6)
Hemoglobin: 12.5 g/dL (ref 11.1–15.9)
Immature Granulocytes Absolute: 0 10*3/uL (ref 0.0–0.1)
Immature Granulocytes: 0 %
Lymphocytes Absolute: 1.5 10*3/uL (ref 0.7–3.1)
Lymphocytes: 19 %
MCH: 30.3 pg (ref 26.6–33.0)
MCHC: 34.2 g/dL (ref 31.5–35.7)
MCV: 89 fL (ref 79–97)
Monocytes Absolute: 0.4 10*3/uL (ref 0.1–0.9)
Monocytes: 5 %
Neutrophils Absolute: 5.8 10*3/uL (ref 1.4–7.0)
Neutrophils: 73 %
Platelets: 216 10*3/uL (ref 150–450)
RBC: 4.12 x10E6/uL (ref 3.77–5.28)
RDW: 15.8 % — ABNORMAL HIGH (ref 11.7–15.4)
WBC: 7.9 10*3/uL (ref 3.4–10.8)

## 2020-03-13 LAB — COMPREHENSIVE METABOLIC PANEL
ALT: 24 IU/L (ref 0–32)
AST (SGOT): 23 IU/L (ref 0–40)
African American eGFR: 71 mL/min/{1.73_m2} (ref >59)
Albumin/Globulin Ratio: 4.9 — ABNORMAL HIGH (ref 1.2–2.2)
Albumin: 4.9 g/dL — ABNORMAL HIGH (ref 3.7–4.7)
Alkaline Phosphatase: 100 IU/L (ref 48–121)
BUN / Creatinine Ratio: 13 (ref 12–28)
BUN: 12 mg/dL (ref 8–27)
Bilirubin, Total: 0.5 mg/dL (ref 0.0–1.2)
CO2: 27 mmol/L (ref 20–29)
Calcium: 9.1 mg/dL (ref 8.7–10.3)
Chloride: 102 mmol/L (ref 96–106)
Creatinine: 0.93 mg/dL (ref 0.57–1.00)
Globulin, Total: 1 g/dL — ABNORMAL LOW (ref 1.5–4.5)
Glucose: 235 mg/dL — ABNORMAL HIGH (ref 65–99)
Potassium: 3.9 mmol/L (ref 3.5–5.2)
Protein, Total: 5.9 g/dL — ABNORMAL LOW (ref 6.0–8.5)
Sodium: 140 mmol/L (ref 134–144)
non-African American eGFR: 61 mL/min/{1.73_m2} (ref >59)

## 2020-03-13 LAB — HEMOGLOBIN A1C: Hemoglobin A1C: 7 % — ABNORMAL HIGH (ref 4.8–5.6)

## 2020-03-13 LAB — PT/INR
PT INR: 1 (ref 0.9–1.2)
PT: 10.6 s (ref 9.1–12.0)

## 2020-03-13 LAB — APTT: aPTT: 23 s — ABNORMAL LOW (ref 24–33)

## 2020-03-13 NOTE — Progress Notes (Signed)
Hello Ms. Soulliere,    Your complete blood count is normal. Your electrolytes, kidney function, liver function are normal. Your clotting studies are normal.     Your A1C has increased from 6.2 to 7.0 over the last three months. Please continue taking your medication regularly and follow-up with the endocrinologist as scheduled.    Please let me know if you have any questions or concerns.    Have a great day,  Ajwa Kimberley, PA-C

## 2020-03-13 NOTE — Telephone Encounter (Signed)
T/c Spoke to pt, and she has f/u with the rheumatologist and is going to ask them for the refill of the med today.

## 2020-03-14 ENCOUNTER — Encounter (INDEPENDENT_AMBULATORY_CARE_PROVIDER_SITE_OTHER): Payer: Self-pay | Admitting: Physician Assistant

## 2020-03-16 LAB — UPPER RESPIRATORY CULTURE

## 2020-03-17 NOTE — Progress Notes (Signed)
She is medically cleared for surgery. Please send her results, EKG and note to the surgeon's office. Thank you.

## 2020-03-26 ENCOUNTER — Telehealth (INDEPENDENT_AMBULATORY_CARE_PROVIDER_SITE_OTHER): Payer: Self-pay | Admitting: Physician Assistant

## 2020-03-26 NOTE — Telephone Encounter (Signed)
[facility] calling today requesting the recent records from [facility] to be faxed over.   Fax: [phone]    Thank you! AH (CMA)

## 2020-03-26 NOTE — Telephone Encounter (Signed)
Faxed over Pre-op report.

## 2020-04-02 ENCOUNTER — Telehealth (INDEPENDENT_AMBULATORY_CARE_PROVIDER_SITE_OTHER): Payer: Self-pay | Admitting: Physician Assistant

## 2020-04-02 DIAGNOSIS — E039 Hypothyroidism, unspecified: Secondary | ICD-10-CM

## 2020-04-02 MED ORDER — LEVOTHYROXINE SODIUM 25 MCG PO TABS
25.0000 ug | ORAL_TABLET | Freq: Every day | ORAL | 0 refills | Status: DC
Start: 2020-04-02 — End: 2020-06-30

## 2020-04-02 NOTE — Addendum Note (Signed)
Addended by: Sydnee Levans on: 04/02/2020 04:53 PM     Modules accepted: Orders

## 2020-04-02 NOTE — Telephone Encounter (Signed)
Patient called. Patient had requested a refill for Rx: levothyroxine (SYNTHROID) 25 MCG tablet but the refill was rejected by the pharmacy twice, and patient wanted to know why it was rejected. Please advise. Thanks! GT

## 2020-04-02 NOTE — Telephone Encounter (Signed)
Spoke with patient and informed her that we have never called in the prescription for her so it was likely her previous doctor's office that was rejecting the refill. Pt reports she has no pills left after today. Pt does have an endocrinologist appointment at the end of August. Told patient I would send her in a 90 day refill of medication to get her to her appointment. 30 days temp refill would not be enough. Verified dosage is 25 mcg. Rx sent.

## 2020-04-03 ENCOUNTER — Telehealth (INDEPENDENT_AMBULATORY_CARE_PROVIDER_SITE_OTHER): Payer: Self-pay | Admitting: Physician Assistant

## 2020-04-03 NOTE — Telephone Encounter (Signed)
Thanks

## 2020-04-03 NOTE — Telephone Encounter (Signed)
Spoke w/ pt who reports she believes the vomiting was unrelated. She has an issue with mucus in her throat that drains down into her stomach and makes her nauseous. Pt reports she is not having any abdominal discomfort, is not running a fever, and is passing gas. Instructed pt to try mirilax, continue with prune juice and stool softeners, and increase water intake. Pt agreed to plan. Also gave pt red flags to go to the ER - pt expressed understanding. No further questions or concerns at this time.

## 2020-04-03 NOTE — Telephone Encounter (Signed)
Can you please call pt and find out a little more information on how she is feeling?  IE was the vomiting totally out of the blue? Any fever or significant discomfort?  If stable, she can try miralax, continue stool softener and prune juice in addition to increased water.  Thank you!

## 2020-04-03 NOTE — Telephone Encounter (Signed)
Claudia Adams, physical therapist from [facility] called to report pt had a right knee replacement recently and has not had a bowel movement in 7 days. She vomited once this morning. She has tried prune juice and a stool softener. Pt/physical therapist are wondering what else we can recommend for her? Please advise, thank you!

## 2020-04-09 ENCOUNTER — Other Ambulatory Visit (INDEPENDENT_AMBULATORY_CARE_PROVIDER_SITE_OTHER): Payer: Self-pay | Admitting: Physician Assistant

## 2020-04-09 DIAGNOSIS — I10 Essential (primary) hypertension: Secondary | ICD-10-CM

## 2020-04-10 ENCOUNTER — Ambulatory Visit (INDEPENDENT_AMBULATORY_CARE_PROVIDER_SITE_OTHER): Payer: Medicare Other | Admitting: "Endocrinology

## 2020-05-05 ENCOUNTER — Ambulatory Visit (INDEPENDENT_AMBULATORY_CARE_PROVIDER_SITE_OTHER): Payer: Medicare Other | Admitting: "Endocrinology

## 2020-05-13 ENCOUNTER — Other Ambulatory Visit (INDEPENDENT_AMBULATORY_CARE_PROVIDER_SITE_OTHER): Payer: Self-pay | Admitting: Physician Assistant

## 2020-05-13 DIAGNOSIS — M069 Rheumatoid arthritis, unspecified: Secondary | ICD-10-CM

## 2020-05-13 MED ORDER — GABAPENTIN 300 MG PO CAPS
ORAL_CAPSULE | ORAL | 0 refills | Status: DC
Start: 2020-05-13 — End: 2020-09-01

## 2020-05-13 NOTE — Telephone Encounter (Signed)
Rx sent electronically. Please notify the patient (if she's expecting a call back.) Thank you.

## 2020-05-13 NOTE — Telephone Encounter (Signed)
Patient called. Patient is requesting for a refill forgabapentin (NEURONTIN) 300 MG capsule . Please send rx to the [facility] on [address].

## 2020-05-20 NOTE — Progress Notes (Deleted)
[facility] - [facility] [place] [facility]                       Date of Exam: 05/21/2020 6:46 PM        Patient ID: Claudia Adams is a 74 y.o. female.  Attending Physician: Larna Daughters, PA        Chief Complaint:    No chief complaint on file.              HPI:    1. TYPE II DIABETES MELLITUS  - First diagnosed with this 25 years ago   - Was on oral medications and changed to insulin a few months prior  - Last A1C: 7.0 (03/2020)   - On Tresiba 15 U daily and Ozempic one day per week   - Followed by endocrinology and has an appointment in August to get established  (?)  - Has had the pneumonia vaccine and COVID vaccine   - Has neuropathy in feet for which she takes gabapentin 300 mg TID  - Taking Livalo 1 mg daily for HLD    2. HYPOTHYROIDISM:   - Taking Synthroid 25 mcg daily   - Last TSH about 2.231 (12/2019)   - Denies palpitations, cold/heat intolerance, constipation    3. HYPERTENSION  - Taking Losartan-HCTZ 100-25 mg  - Has not been monitoring her BP at home regularly   - Denies chest pain, palpitations, lower extremity edema or SOB  - Tolerating her medication well  - No history of heart disease  - Had a nuclear stress test and carotid doppler recently which were all normal    4. RHEUMATOID ARTHRITIS  - First diagnosed about 15 years ago   - Followed now by rheumatology in our area, does not recall the providers name  - Currently on Remicade infusions every 6 weeks  - Last infusion about six weeks ago (?)  - Also uses diclofenac PRN, hydroxychloroquine daily, methotrexate weekly   - Tramadol is PRN for her pain    5. ANXIETY  - Takes Lexapro 20 mg daily and Abilify 2 mg daily  - Previously followed by psychiatry in [place] and still plans to establish with a provider in our area (?)  - Using Ativan 1 mg PRN and states her panic attacks have recently been better controlled  - Using Trazodone 50 mg nightly as needed  - Lost her husband a few years ago   - Recently relocated to be  closer to family             Problem List:    Patient Active Problem List   Diagnosis    Seasonal allergic rhinitis    Rheumatoid arthritis    Type 2 diabetes mellitus with diabetic polyneuropathy, with long-term current use of insulin    Essential hypertension    Hyperlipidemia, mixed    Hypothyroidism, unspecified type    Anxiety and depression             Current Meds:    No outpatient medications have been marked as taking for the 05/21/20 encounter (Appointment) with Leanna Hamid S, PA.          Allergies:    Allergies   Allergen Reactions    Penicillin G              Past Surgical History:    Past Surgical History:   Procedure Laterality Date    SHOULDER SURGERY Right 2019  Rotator cuff           Family History:    No family history on file.        Social History:    Social History     Tobacco Use    Smoking status: Never Smoker    Smokeless tobacco: Never Used   Haematologist Use: Never used   Substance Use Topics    Alcohol use: Not Currently    Drug use: Not on file          The following sections were reviewed this encounter by the provider:            Vital Signs:    There were no vitals taken for this visit.         ROS:    Review of Systems           Physical Exam:    Physical Exam         Assessment/Plan:    There are no diagnoses linked to this encounter.                Follow-up:    No follow-ups on file.         Tyaisha Cullom S Marcina Kinnison, PA

## 2020-05-21 ENCOUNTER — Encounter (INDEPENDENT_AMBULATORY_CARE_PROVIDER_SITE_OTHER): Payer: Self-pay | Admitting: Physician Assistant

## 2020-05-21 ENCOUNTER — Ambulatory Visit (INDEPENDENT_AMBULATORY_CARE_PROVIDER_SITE_OTHER): Payer: Medicare Other | Admitting: Physician Assistant

## 2020-05-21 VITALS — BP 118/80 | HR 84 | Temp 97.6°F | Ht 61.0 in | Wt 164.0 lb

## 2020-05-21 DIAGNOSIS — E039 Hypothyroidism, unspecified: Secondary | ICD-10-CM

## 2020-05-21 DIAGNOSIS — M069 Rheumatoid arthritis, unspecified: Secondary | ICD-10-CM

## 2020-05-21 DIAGNOSIS — Z794 Long term (current) use of insulin: Secondary | ICD-10-CM

## 2020-05-21 DIAGNOSIS — F329 Major depressive disorder, single episode, unspecified: Secondary | ICD-10-CM

## 2020-05-21 DIAGNOSIS — E1142 Type 2 diabetes mellitus with diabetic polyneuropathy: Secondary | ICD-10-CM

## 2020-05-21 DIAGNOSIS — I10 Essential (primary) hypertension: Secondary | ICD-10-CM

## 2020-05-21 DIAGNOSIS — F419 Anxiety disorder, unspecified: Secondary | ICD-10-CM

## 2020-05-21 DIAGNOSIS — E782 Mixed hyperlipidemia: Secondary | ICD-10-CM

## 2020-05-21 LAB — COMPREHENSIVE METABOLIC PANEL
ALT: 14 U/L (ref 0–55)
AST (SGOT): 24 U/L (ref 5–34)
Albumin/Globulin Ratio: 1.4 (ref 0.9–2.2)
Albumin: 3.8 g/dL (ref 3.5–5.0)
Alkaline Phosphatase: 99 U/L (ref 37–117)
Anion Gap: 8 (ref 5.0–15.0)
BUN: 13 mg/dL (ref 7.0–19.0)
Bilirubin, Total: 0.6 mg/dL (ref 0.2–1.2)
CO2: 29 mEq/L (ref 21–29)
Calcium: 9.5 mg/dL (ref 7.9–10.2)
Chloride: 103 mEq/L (ref 100–111)
Creatinine: 1.1 mg/dL (ref 0.4–1.5)
Globulin: 2.7 g/dL (ref 2.0–3.7)
Glucose: 309 mg/dL — ABNORMAL HIGH (ref 70–100)
Potassium: 4.7 mEq/L (ref 3.5–5.1)
Protein, Total: 6.5 g/dL (ref 6.0–8.3)
Sodium: 140 mEq/L (ref 136–145)

## 2020-05-21 LAB — HEMOLYSIS INDEX: Hemolysis Index: 4 (ref 0–18)

## 2020-05-21 LAB — LIPID PANEL
Cholesterol / HDL Ratio: 2.2
Cholesterol: 172 mg/dL (ref 0–199)
HDL: 77 mg/dL (ref 40–9999)
LDL Calculated: 71 mg/dL (ref 0–99)
Triglycerides: 120 mg/dL (ref 34–149)
VLDL Calculated: 24 mg/dL (ref 10–40)

## 2020-05-21 LAB — TSH: TSH: 1.37 u[IU]/mL (ref 0.35–4.94)

## 2020-05-21 LAB — T4, FREE: T4 Free: 0.86 ng/dL (ref 0.70–1.48)

## 2020-05-21 LAB — GFR: EGFR: 58.7

## 2020-05-21 MED ORDER — TRESIBA FLEXTOUCH 100 UNIT/ML SC SOPN
20.0000 [IU] | PEN_INJECTOR | Freq: Every day | SUBCUTANEOUS | 1 refills | Status: DC
Start: 2020-05-21 — End: 2020-06-23

## 2020-05-21 MED ORDER — OZEMPIC (1 MG/DOSE) 4 MG/3ML SC SOPN
1.0000 mg | PEN_INJECTOR | SUBCUTANEOUS | 1 refills | Status: DC
Start: 2020-05-21 — End: 2020-08-21

## 2020-05-21 NOTE — Progress Notes (Signed)
[facility] - [facility] [place] [facility]                       Date of Exam: 05/21/2020 10:51 AM        Patient ID: Claudia Adams is a 74 y.o. female.  Attending Physician: Larna Daughters, PA        Chief Complaint:    Chief Complaint   Patient presents with    Diabetes               HPI:    1. TYPE II DIABETES MELLITUS/HLD  - First diagnosed with this 25 years ago   - Was on oral medications and changed to insulin a few months prior  - Last A1C: 7.0 (03/2020)   - Average fasting BG: 130s (ranges 115-180)  - On Tresiba 15 U daily and Ozempic 1 mg once per week  - Followed by endocrinology and has an appointment in November to get established   - Has had the pneumonia vaccine and COVID vaccine   - Has neuropathy in feet for which she takes gabapentin 300 mg TID  - Taking Livalo 1 mg daily for HLD  - Needs refills of both her medications    2. HYPOTHYROIDISM:   - Taking Synthroid 25 mcg daily   - Last TSH about 2.231 (12/2019)   - Denies palpitations, cold/heat intolerance, constipation    3. HYPERTENSION  - Taking Losartan-HCTZ 100-25 mg  - Has not been monitoring her BP at home regularly   - Denies chest pain, palpitations, lower extremity edema or SOB  - Tolerating her medication well  - No history of heart disease  - Had a nuclear stress test and carotid doppler recently which were all normal    4. RHEUMATOID ARTHRITIS  - First diagnosed about 15 years ago   - Followed now by rheumatology in our area, does not recall the providers name  - Currently on Remicade infusions every 6 weeks  - Last infusion about four weeks ago  - Also uses diclofenac PRN, hydroxychloroquine daily, methotrexate weekly   - Tramadol is PRN for her pain  - S/p right total knee replacement in 2021  - In PT now and states she's doing well     5. ANXIETY  - Takes Lexapro 20 mg daily and Abilify 2 mg daily  - Previously followed by psychiatry in [place] and still plans to establish with a provider in the area if  needed  - Using Ativan 1 mg PRN and states her panic attacks have recently been better controlled  - Using Trazodone 50 mg nightly as needed  - Lost her husband a few years ago   - Recently relocated to be closer to family   - Is under a lot of stress            Problem List:    Patient Active Problem List   Diagnosis    Seasonal allergic rhinitis    Rheumatoid arthritis    Type 2 diabetes mellitus with diabetic polyneuropathy, with long-term current use of insulin    Essential hypertension    Hyperlipidemia, mixed    Hypothyroidism, unspecified type    Anxiety and depression             Current Meds:    Outpatient Medications Marked as Taking for the 05/21/20 encounter (Office Visit) with Zanetta Dehaan S, PA   Medication Sig Dispense Refill    Accu-Chek  SmartView test strip Monitor blood glucose once daily, Dx: E11.42 100 each 11    ARIPiprazole (ABILIFY) 2 MG tablet Take 2 mg by mouth daily      ASPIRIN 81 PO aspirin      BD Pen Needle Nano 2nd Gen 32G X 4 MM Misc Use once daily as directed. 100 each 1    Diclofenac Sodium 1.5 % Solution Apply topically      escitalopram (LEXAPRO) 20 MG tablet Take 20 mg by mouth daily      fluticasone (FLONASE) 50 MCG/ACT nasal spray 1 spray by Nasal route daily 3 Bottle 1    folic acid (FOLVITE) 1 MG tablet TAKE 1 TABLET BY MOUTH EVERY DAY 90 tablet 1    gabapentin (NEURONTIN) 300 MG capsule TAKE 1 CAPSULE BY MOUTH THREE TIMES A DAY 270 capsule 0    hydroxychloroquine (PLAQUENIL) 200 MG tablet TAKE 2 TABLET BY MOUTH EVERY OTHER DAY AND 1 TABLET EVERY OTHER DAY      inFLIXimab (REMICADE IV) Remicade      Insulin Degludec Evaristo Bury FlexTouch) 100 UNIT/ML Solution Pen-injector Inject 20 Unit into the skin daily 6 pen 1    KLOR-CON M 10 MEQ tablet       levothyroxine (SYNTHROID) 25 MCG tablet Take 1 tablet (25 mcg total) by mouth Once a day at 6:00am 90 tablet 0    LORazepam (ATIVAN) 1 MG tablet lorazepam 1 mg tablet   TAKE 1 TABLET BY MOUTH TWICE A DAY AS NEEDED       losartan-hydrochlorothiazide (HYZAAR) 100-25 MG per tablet Take 1 tablet by mouth daily 90 tablet 1    methotrexate 2.5 MG tablet       metoprolol succinate XL (TOPROL-XL) 50 MG 24 hr tablet TAKE 1 TABLET BY MOUTH EVERY DAY 90 tablet 1    Pitavastatin Magnesium 1 MG Tab Take by mouth      Semaglutide, 1 MG/DOSE, (Ozempic, 1 MG/DOSE,) 4 MG/3ML Solution Pen-injector Inject 1 mg into the skin once a week Once a week 9 mL 1    Tapentadol HCl 50 MG Tablet SR 12 hr Take by mouth      traMADol (ULTRAM) 50 MG tablet Take 50 mg by mouth every 6 (six) hours as needed for Pain      traZODone (DESYREL) 50 MG tablet trazodone 50 mg tablet   TAKE 1 (ONE) TABLET AT BEDTIME, AS NEEDED      [DISCONTINUED] Insulin Degludec Evaristo Bury FlexTouch) 100 UNIT/ML Solution Pen-injector Inject 15 Unit into the skin daily 5 pen 1    [DISCONTINUED] Semaglutide, 1 MG/DOSE, (Ozempic, 1 MG/DOSE,) 4 MG/3ML Solution Pen-injector Inject 1 mg into the skin Once a week            Allergies:    Allergies   Allergen Reactions    Penicillin G              Past Surgical History:    Past Surgical History:   Procedure Laterality Date    KNEE SURGERY Right 11/30/2019    knee replacement by Dr. Harlene Salts    SHOULDER SURGERY Right 2019    Rotator cuff           Family History:    History reviewed. No pertinent family history.        Social History:    Social History     Tobacco Use    Smoking status: Never Smoker    Smokeless tobacco: Never Used   Advertising account planner  Vaping Use: Never used   Substance Use Topics    Alcohol use: Not Currently    Drug use: Never          The following sections were reviewed this encounter by the provider:   Tobacco   Allergies   Meds   Problems   Med Hx   Surg Hx   Fam Hx              Vital Signs:    BP 118/80 (BP Site: Left arm, Patient Position: Sitting, Cuff Size: Small)    Pulse 84    Temp 97.6 F (36.4 C)    Ht 1.549 m (5\' 1" )    Wt 74.4 kg (164 lb)    SpO2 97%    BMI 30.99 kg/m          ROS:    Review of  Systems   Constitutional: Negative for activity change, appetite change and chills.   Respiratory: Negative for cough, shortness of breath and wheezing.    Cardiovascular: Negative for chest pain and palpitations.   Gastrointestinal: Negative for abdominal pain, constipation and diarrhea.   Genitourinary: Negative for dysuria, flank pain and frequency.   Musculoskeletal: Negative for back pain, myalgias and neck pain.   Skin: Negative for rash.   Neurological: Negative for dizziness, weakness and numbness.              Physical Exam:    Physical Exam  Constitutional:       Appearance: Normal appearance.   HENT:      Head: Normocephalic and atraumatic.   Cardiovascular:      Rate and Rhythm: Normal rate and regular rhythm.      Pulses: Normal pulses.      Heart sounds: Normal heart sounds.   Pulmonary:      Effort: Pulmonary effort is normal. No respiratory distress.      Breath sounds: Normal breath sounds.   Musculoskeletal:      Cervical back: Normal range of motion and neck supple.      Right lower leg: No edema.      Left lower leg: No edema.   Neurological:      General: No focal deficit present.      Mental Status: She is alert.   Psychiatric:         Mood and Affect: Mood normal.         Behavior: Behavior normal.         Thought Content: Thought content normal.              Assessment/Plan:    1. Type 2 diabetes mellitus with diabetic polyneuropathy, with long-term current use of insulin  - Comprehensive metabolic panel  - Insulin Degludec Evaristo Bury FlexTouch) 100 UNIT/ML Solution Pen-injector; Inject 20 Unit into the skin daily  Dispense: 6 pen; Refill: 1    - Diabetes is stable but recently she had an increase in her A1C   - Will plan to increase her insulin to 20 units in the morning  - Continue with Ozempic weekly  - Plans to establish care with endocrinology in November    2. Essential hypertension  - BP well controlled at this time  - Plan to continue with the present medication/dose  - Discussed  signs/symptoms and possible etiologies including lifestyle and genetic factors  - Recommend monitoring BP at home and working on a low salt diet    3. Hypothyroidism, unspecified type  - T4, free  -  TSH    - Stable at this time and happy with the current dosage  - Discussed signs/symptoms of hypothyroidism  - Will adjust dose based on today's results as indicated  - Instructed to take medication on an empty stomach, first thing in the morning  - Instructed to follow-up for recheck as discussed or sooner if palpitations, heat intolerance or unintentional weight loss develops    4. Hyperlipidemia, mixed  - Lipid panel    - Discussed possible etiologies including lifestyle and genetic factors  - Recommend focusing on a low saturated fat (<14 g) and high soluble fiber diet (goal >20g daily)  - Recommend regular exercise    5. Rheumatoid arthritis involving multiple sites, unspecified whether rheumatoid factor present    - Stable at this time  - Followed by rheumatology and orthopedics  - S/p right knee replacement and doing well since then    6. Anxiety and depression  - Well controlled at this time  - Followed by psychiatry for this  - Will continue with the present medication and dosage  - Instructed to take medication daily and do not stop abruptly                 Follow-up:    Return in about 3 months (around 08/20/2020) for Medication follow-up.         Farrel Guimond S Novi Calia, PA

## 2020-05-22 NOTE — Progress Notes (Signed)
Please give her a call with the results and recommendations to see nephrology. Thank you.   Hello Claudia Adams,    Your electrolytes and liver function are normal. Your thyroid function is within the goal range. Your blood sugar is quite high. Please increase the dose of your insulin to 20 units every morning as discussed.     One of your kidney function tests called the glomerular filtration rate is low again. This was low in April and then slightly improved in July. Because this has dropped down again, I am concerned that you may have some chronic kidney disease developing. This can of course be secondary to some of your other chronic conditions. But can impact how you break down medications too. Please make sure to avoid over the counter NSAIDs and keep up the good work with your water intake. I would also like for you to consult with a nephrologist to establish a relationship with them regarding your kidney function and a plan for monitoring moving forward.     I recommend, Dr. Roswell Nickel. She is an excellent provider and I think you all will really get along well. Here is her website with contact information: [email or web address]    Please let me know if you have any questions or concerns. I would like to see you for a follow-up in three months.     Have a great day,  Falen Lehrmann, PA-C

## 2020-06-10 ENCOUNTER — Other Ambulatory Visit (INDEPENDENT_AMBULATORY_CARE_PROVIDER_SITE_OTHER): Payer: Self-pay | Admitting: Physician Assistant

## 2020-06-10 DIAGNOSIS — I10 Essential (primary) hypertension: Secondary | ICD-10-CM

## 2020-06-14 ENCOUNTER — Other Ambulatory Visit (INDEPENDENT_AMBULATORY_CARE_PROVIDER_SITE_OTHER): Payer: Self-pay | Admitting: Physician Assistant

## 2020-06-21 ENCOUNTER — Other Ambulatory Visit (INDEPENDENT_AMBULATORY_CARE_PROVIDER_SITE_OTHER): Payer: Self-pay | Admitting: Physician Assistant

## 2020-06-21 DIAGNOSIS — Z794 Long term (current) use of insulin: Secondary | ICD-10-CM

## 2020-06-23 DIAGNOSIS — F419 Anxiety disorder, unspecified: Secondary | ICD-10-CM | POA: Insufficient documentation

## 2020-06-23 DIAGNOSIS — M069 Rheumatoid arthritis, unspecified: Secondary | ICD-10-CM | POA: Insufficient documentation

## 2020-06-26 ENCOUNTER — Telehealth (INDEPENDENT_AMBULATORY_CARE_PROVIDER_SITE_OTHER): Payer: Self-pay | Admitting: Physician Assistant

## 2020-06-26 NOTE — Telephone Encounter (Signed)
Please call pt and let them know that 1 refill is still remaining for them at [facility], thank you!

## 2020-06-26 NOTE — Telephone Encounter (Signed)
Pt call pt stated that she is out of her insulin needles and is requesting a refill for BD Pen Needle Nano 2nd Gen 32G X 4 MM Misc, please send rx to pharmacy on file

## 2020-06-28 ENCOUNTER — Other Ambulatory Visit (INDEPENDENT_AMBULATORY_CARE_PROVIDER_SITE_OTHER): Payer: Self-pay | Admitting: Physician Assistant

## 2020-06-28 DIAGNOSIS — E039 Hypothyroidism, unspecified: Secondary | ICD-10-CM

## 2020-06-30 ENCOUNTER — Telehealth (INDEPENDENT_AMBULATORY_CARE_PROVIDER_SITE_OTHER): Payer: Self-pay | Admitting: Physician Assistant

## 2020-06-30 DIAGNOSIS — E039 Hypothyroidism, unspecified: Secondary | ICD-10-CM

## 2020-06-30 MED ORDER — LEVOTHYROXINE SODIUM 25 MCG PO TABS
25.0000 ug | ORAL_TABLET | Freq: Every day | ORAL | 0 refills | Status: DC
Start: 2020-06-30 — End: 2020-08-26

## 2020-06-30 NOTE — Addendum Note (Signed)
Addended byRonn Melena on: 06/30/2020 09:58 AM     Modules accepted: Orders

## 2020-06-30 NOTE — Telephone Encounter (Signed)
Patient called. Patient was requesting refill for Rx: levothyroxine (SYNTHROID) 25 MCG tablet. Please send refill to [facility] on [address] Lynford Humphrey! [place]

## 2020-06-30 NOTE — Telephone Encounter (Signed)
Please call pt and let them know that 90 day supply was sent to requested pharmacy, thank you!

## 2020-07-02 ENCOUNTER — Ambulatory Visit (INDEPENDENT_AMBULATORY_CARE_PROVIDER_SITE_OTHER): Payer: Medicare Other

## 2020-07-02 DIAGNOSIS — Z23 Encounter for immunization: Secondary | ICD-10-CM

## 2020-07-02 NOTE — Progress Notes (Signed)
Patient arrived for Covid booster vaccination. Pfizer variant administered. Patient observed for 15 min and left without incident.

## 2020-07-08 ENCOUNTER — Ambulatory Visit (INDEPENDENT_AMBULATORY_CARE_PROVIDER_SITE_OTHER): Payer: Medicare Other | Admitting: "Endocrinology

## 2020-07-08 ENCOUNTER — Encounter (INDEPENDENT_AMBULATORY_CARE_PROVIDER_SITE_OTHER): Payer: Self-pay | Admitting: "Endocrinology

## 2020-07-08 VITALS — BP 144/73 | HR 66 | Resp 16 | Ht 61.0 in | Wt 166.0 lb

## 2020-07-08 DIAGNOSIS — Z794 Long term (current) use of insulin: Secondary | ICD-10-CM

## 2020-07-08 DIAGNOSIS — E039 Hypothyroidism, unspecified: Secondary | ICD-10-CM

## 2020-07-08 DIAGNOSIS — N183 Chronic kidney disease, stage 3 unspecified: Secondary | ICD-10-CM

## 2020-07-08 DIAGNOSIS — E1122 Type 2 diabetes mellitus with diabetic chronic kidney disease: Secondary | ICD-10-CM

## 2020-07-08 DIAGNOSIS — E782 Mixed hyperlipidemia: Secondary | ICD-10-CM

## 2020-07-08 MED ORDER — OZEMPIC (0.25 OR 0.5 MG/DOSE) 2 MG/1.5ML SC SOPN
0.2500 mg | PEN_INJECTOR | SUBCUTANEOUS | 0 refills | Status: DC
Start: 2020-07-08 — End: 2020-08-21

## 2020-07-08 NOTE — Progress Notes (Signed)
Subjective:      Date: 07/08/2020 2:18 PM   Patient ID: Claudia Adams is a 74 y.o. female.    Chief Complaint:  Chief Complaint   Patient presents with    Diabetes     type 2       HPI  Visit type: initial eval - existing diagnosis  Type: Type II and insulin requiring  Evaluation: diagnosed 20 years ago   Last follow-up: 05/21/20 with PA  Amy Oppelt     Diabetes Medication Regimen: Ozempic 1 mg weekly , Tresiba 20 units every evening     - last took Ozempic 1 month ago   - she stopped Ozempic, she was concerned due to fasting sugars in the 120s    Medication effectiveness/adherence: general adherence with medications  Medication side effects: none  Presenting symptoms at time of diagnosis: abnormal screening glucose     Prior medications: Metformin d/c due to kidney function     Interval events:   S/p right knee replacement, July 2021 and currently on PT 3X weekly     Has RA     Has ckd and is following with Nephro, will have kidney scan     Current symptoms: tingling/numbness in extremities  Patient denies: no other symptoms including chest pain, shortness of breath, hypoglycemic episodes, polydipsia, polyuria, headaches, dizziness, or abdominal pain  Exercise: PT 3X weekly   Diet: moderate compliance with recommended diet   Diet recall   Bruch: ensure, yogurt or boiled egg and 1 pc of toast  Dinner: microwable high protein foods from [employer]     Complications: diabetic peripheral neuropathy  Pertinent medical history: hyperlipidemia, hypertension, hypothyroidism   Home glucose readings: Patient checks blood sugars once daily fasting    Fasting:  117-120     A1c result: 7.0 03/13/20  A1c trend is at goal  Microalbumin trend: No results found for: MICROALBUMIN  Microalbumin trend is utd  LDL trend:   Lab Results   Component Value Date    LDL 71 05/21/2020    LDL 99 12/12/2019     LDL trend is at goal  Additional concerns: Pt has stable essential HTN with no evidence of CHF or proteinuria.  The patient is  compliant with anti-hypertensive therapy and denies side effects to therapy.  Pt denies CP, SOB, dizziness, orthopnea, PND or edema.  Pt has hyperlipidemia and is compliant with lipid therapy.  Pt denies side effects of lipid therapy - specifically denies myalgia.  Pt has hypothyroidism - compliant with current regimen.  Denies side effects to therapy.  Denies any change in hair/nails/skin, constipation, unexplained wt changes.        Problem List:  Patient Active Problem List   Diagnosis    Seasonal allergic rhinitis    Rheumatoid arthritis    Type 2 diabetes mellitus with diabetic polyneuropathy, with long-term current use of insulin    Essential hypertension    Hyperlipidemia, mixed    Hypothyroidism, unspecified type    Anxiety and depression       Current Medications:  Current Outpatient Medications   Medication Sig Dispense Refill    Accu-Chek SmartView test strip Monitor blood glucose once daily, Dx: E11.42 100 each 11    ARIPiprazole (ABILIFY) 2 MG tablet Take 2 mg by mouth daily      ASPIRIN 81 PO aspirin      BD Pen Needle Nano 2nd Gen 32G X 4 MM Misc Use once daily as directed. 100 each  1    Diclofenac Sodium 1.5 % Solution Apply topically      escitalopram (LEXAPRO) 20 MG tablet Take 20 mg by mouth daily      fluticasone (FLONASE) 50 MCG/ACT nasal spray 1 spray by Nasal route daily 3 Bottle 1    folic acid (FOLVITE) 1 MG tablet TAKE 1 TABLET BY MOUTH EVERY DAY 90 tablet 1    gabapentin (NEURONTIN) 300 MG capsule TAKE 1 CAPSULE BY MOUTH THREE TIMES A DAY 270 capsule 0    hydroxychloroquine (PLAQUENIL) 200 MG tablet TAKE 2 TABLET BY MOUTH EVERY OTHER DAY AND 1 TABLET EVERY OTHER DAY      inFLIXimab (REMICADE IV) Remicade      KLOR-CON M 10 MEQ tablet       levothyroxine (SYNTHROID) 25 MCG tablet Take 1 tablet (25 mcg total) by mouth Once a day at 6:00am 90 tablet 0    LORazepam (ATIVAN) 1 MG tablet lorazepam 1 mg tablet   TAKE 1 TABLET BY MOUTH TWICE A DAY AS NEEDED       losartan-hydrochlorothiazide (HYZAAR) 100-25 MG per tablet TAKE 1 TABLET BY MOUTH DAILY 90 tablet 1    methotrexate 2.5 MG tablet       metoprolol succinate XL (TOPROL-XL) 50 MG 24 hr tablet TAKE 1 TABLET BY MOUTH EVERY DAY 90 tablet 1    Pitavastatin Magnesium 1 MG Tab Take by mouth      Semaglutide, 1 MG/DOSE, (Ozempic, 1 MG/DOSE,) 4 MG/3ML Solution Pen-injector Inject 1 mg into the skin once a week Once a week 9 mL 1    Tapentadol HCl 50 MG Tablet SR 12 hr Take by mouth      traMADol (ULTRAM) 50 MG tablet Take 50 mg by mouth every 6 (six) hours as needed for Pain      traZODone (DESYREL) 50 MG tablet trazodone 50 mg tablet   TAKE 1 (ONE) TABLET AT BEDTIME, AS NEEDED      Evaristo Bury FlexTouch 100 UNIT/ML Solution Pen-injector ADMINISTER 20 UNITS UNDER THE SKIN DAILY 6 mL 2     No current facility-administered medications for this visit.       Allergies:  Allergies   Allergen Reactions    Penicillin G        Past Medical History:  Past Medical History:   Diagnosis Date    Anxiety     Diabetes mellitus     Disorder of thyroid     Hypertension     Rheumatoid arthritis     Seasonal allergic rhinitis        Past Surgical History:  Past Surgical History:   Procedure Laterality Date    KNEE SURGERY Right 11/30/2019    knee replacement by Dr. Harlene Salts    SHOULDER SURGERY Right 2019    Rotator cuff       Family History:  No family history on file.    Social History:  Social History     Socioeconomic History    Marital status: Married     Spouse name: Not on file    Number of children: Not on file    Years of education: Not on file    Highest education level: Not on file   Occupational History    Not on file   Tobacco Use    Smoking status: Never Smoker    Smokeless tobacco: Never Used   Vaping Use    Vaping Use: Never used   Substance and Sexual Activity  Alcohol use: Not Currently    Drug use: Never    Sexual activity: Not on file   Other Topics Concern    Not on file   Social History  Narrative    Not on file     Social Determinants of Health     Financial Resource Strain:     Difficulty of Paying Living Expenses: Not on file   Food Insecurity:     Worried About Running Out of Food in the Last Year: Not on file    Ran Out of Food in the Last Year: Not on file   Transportation Needs:     Lack of Transportation (Medical): Not on file    Lack of Transportation (Non-Medical): Not on file   Physical Activity:     Days of Exercise per Week: Not on file    Minutes of Exercise per Session: Not on file   Stress:     Feeling of Stress : Not on file   Social Connections:     Frequency of Communication with Friends and Family: Not on file    Frequency of Social Gatherings with Friends and Family: Not on file    Attends Religious Services: Not on file    Active Member of Clubs or Organizations: Not on file    Attends Banker Meetings: Not on file    Marital Status: Not on file   Intimate Partner Violence:     Fear of Current or Ex-Partner: Not on file    Emotionally Abused: Not on file    Physically Abused: Not on file    Sexually Abused: Not on file   Housing Stability:     Unable to Pay for Housing in the Last Year: Not on file    Number of Places Lived in the Last Year: Not on file    Unstable Housing in the Last Year: Not on file       The following sections were reviewed this encounter by the provider:   Tobacco   Allergies   Meds   Problems   Med Hx   Surg Hx   Fam Hx   Soc Hx            Vitals:  BP 144/73 (BP Site: Left arm, Patient Position: Sitting, Cuff Size: Large)    Pulse 66    Resp 16    Ht 1.549 m (5\' 1" )    Wt 75.3 kg (166 lb)    SpO2 96%    BMI 31.37 kg/m      ROS: A complete 12 point ROS was obtained. Pertinent positives and negatives as noted in HPI. All other systems are negative.    Physical Examination     General appearance: alert, appears stated age, cooperative and no distress  Eyes: conjunctivae/corneas clear. PERRL, EOM's intact.   Neck: no  cervical lymphadenopathy, no carotid bruit,  supple, symmetrical, trachea midline   Thyroid:normal sized on inspection; non tender to palpation and no discrete nodules palpated  Lungs: clear to auscultation bilaterally  Heart: regular rate and rhythm, S1, S2 normal, no murmur, click, rub or gallop  Abdomen: soft, non-tender; bowel sounds normal; no masses,  no organomegaly  Extremities: extremities normal, atraumatic, no cyanosis or edema  Foot Exam: monofilament testing of plantar aspect of first toe and metatarsal joints (10g monofilament) intact bilaterally, no fissures, maceration, ulceration or lesions bilaterally, normal hair growth bilaterally, normal pigmentation bilaterally and vibratory perception (128 Hz tuning fork) applied to first  toe intact bilaterally; Pedal pulses 2+ bilaterally  Skin: Skin color, texture, turgor normal. No rashes or lesions  Neurologic: Alert and oriented X 3, normal strength and tone. Normal symmetric reflexes. No tremor    Assessment and Plan     1. Controlled type 2 diabetes mellitus with stage 3 chronic kidney disease, with long-term current use of insulin    -   Lab Results   Component Value Date    HGBA1C 7.0 (H) 03/12/2020    HGBA1C 6.2 (H) 12/12/2019       Last A1C was at goal. she was still on Ozempic at that time   -   recommend restarting Ozempic    - start titration dose given off Ozempic x 1 month   - decrease Tresiba to 16 units if fasting sugars are consistently < 100   - Recommend home glucometer testing twice a day (pre-breakfast or 2 hrs post-prandial).   -  Recommend therapeutic lifestyle changes which include optimizing low carbohydrate diet and aerobic exercise efforts.      - Semaglutide,0.25 or 0.5MG /DOS, (Ozempic, 0.25 or 0.5 MG/DOSE,) 2 MG/1.5ML Solution Pen-injector; Inject 0.25 mg into the skin once a week Take 0.25 mg SC weekly x 2 weeks, then increase to 0.5 mg SC weekly x 2 weeks  Dispense: 1.5 mL; Refill: 0     2. Hyperlipidemia   - current LDL at  goal   - continue current statin therapy     3. Hypothyroidism   - clinically euthyroid   - most recent TFTs normal   - continue current dose Levothyroxine 25 mcg daily     4. Hypertension   - POCT BP sub optimal  - will continue to monitor and recommend ambulatory BP monitoring   - BP goal < 130/80     - on Losartan-HCTZ 100-25 mg daily, metoprolol XL 50 mg daily     RTC in 6 weeks     Bynum Bellows MSN FNP-BC  [facility]

## 2020-07-08 NOTE — Patient Instructions (Addendum)
Restart Ozempic as follows:     Start Ozempic 0.25 mg subcutaneous injection (inject in lower abdominal area) once weekly for 2 weeks.  If well tolerated increase to 0.5 mg once weekly.   Then increase to 1 mg once weekly thereafter.     Continue Tresiba 20 units nightly.     Decrease Tresiba to 16 units if your fasting sugars in the morning are dropping to the 80s.       Check BS twice daily: fasting and 2 hours after dinner       A1c* 6.5-7.0    Fasting Glucose Level 80-130 mg/dl    2 hours After Meal Glucose Level < 180 mg/dl     Normal sugar levels without diabetes - fasting 70 to 99    2 hours after a meal < 140    1 hour after a meal < 200

## 2020-07-15 ENCOUNTER — Other Ambulatory Visit (INDEPENDENT_AMBULATORY_CARE_PROVIDER_SITE_OTHER): Payer: Self-pay | Admitting: Physician Assistant

## 2020-07-15 DIAGNOSIS — I10 Essential (primary) hypertension: Secondary | ICD-10-CM

## 2020-07-29 ENCOUNTER — Telehealth (INDEPENDENT_AMBULATORY_CARE_PROVIDER_SITE_OTHER): Payer: Self-pay | Admitting: Physician Assistant

## 2020-07-29 DIAGNOSIS — E1142 Type 2 diabetes mellitus with diabetic polyneuropathy: Secondary | ICD-10-CM

## 2020-07-29 MED ORDER — BD PEN NEEDLE NANO 2ND GEN 32G X 4 MM MISC
1 refills | Status: DC
Start: 2020-07-29 — End: 2022-06-17

## 2020-07-29 NOTE — Addendum Note (Signed)
Addended by: Ronn Melena on: 07/29/2020 11:47 AM     Modules accepted: Orders

## 2020-07-29 NOTE — Telephone Encounter (Addendum)
Can I send full 6 month supply of this for her? She has December f/u with you. I saw on your notes she is supposed to have appt with Endocrinology, but I figured the needle refill would be okay for [place] to fill.    Please let me know

## 2020-07-29 NOTE — Telephone Encounter (Signed)
Spoke with pt and relayed AO's message.

## 2020-07-29 NOTE — Telephone Encounter (Signed)
Patient called. Patient was requesting refill for Rx: BD Pen Needle Nano 2nd Gen 32G X 4 MM Misc. Please send refill to [facility] on [address] Lynford Humphrey! [place]

## 2020-07-29 NOTE — Telephone Encounter (Signed)
Rx sent electronically. She saw endo on 11/2 so from now on she can contact them for any prescription refills as they will be managing her diabetes medications. Thank you.

## 2020-07-29 NOTE — Addendum Note (Signed)
Addended byUrbano Heir K. on: 07/29/2020 12:03 PM     Modules accepted: Orders

## 2020-08-12 ENCOUNTER — Other Ambulatory Visit (FREE_STANDING_LABORATORY_FACILITY): Payer: Medicare Other

## 2020-08-12 DIAGNOSIS — N183 Chronic kidney disease, stage 3 unspecified: Secondary | ICD-10-CM

## 2020-08-12 DIAGNOSIS — E1122 Type 2 diabetes mellitus with diabetic chronic kidney disease: Secondary | ICD-10-CM

## 2020-08-12 DIAGNOSIS — E782 Mixed hyperlipidemia: Secondary | ICD-10-CM

## 2020-08-12 DIAGNOSIS — Z794 Long term (current) use of insulin: Secondary | ICD-10-CM

## 2020-08-12 DIAGNOSIS — E039 Hypothyroidism, unspecified: Secondary | ICD-10-CM

## 2020-08-12 LAB — HEMOLYSIS INDEX: Hemolysis Index: 10 (ref 0–24)

## 2020-08-12 LAB — COMPREHENSIVE METABOLIC PANEL
ALT: 18 U/L (ref 0–55)
AST (SGOT): 23 U/L (ref 5–34)
Albumin/Globulin Ratio: 1.5 (ref 0.9–2.2)
Albumin: 3.8 g/dL (ref 3.5–5.0)
Alkaline Phosphatase: 81 U/L (ref 37–117)
Anion Gap: 12 (ref 5.0–15.0)
BUN: 16 mg/dL (ref 7.0–19.0)
Bilirubin, Total: 1 mg/dL (ref 0.2–1.2)
CO2: 25 mEq/L (ref 21–29)
Calcium: 8.8 mg/dL (ref 7.9–10.2)
Chloride: 106 mEq/L (ref 100–111)
Creatinine: 1 mg/dL (ref 0.4–1.5)
Globulin: 2.6 g/dL (ref 2.0–3.7)
Glucose: 154 mg/dL — ABNORMAL HIGH (ref 70–100)
Potassium: 3.9 mEq/L (ref 3.5–5.1)
Protein, Total: 6.4 g/dL (ref 6.0–8.3)
Sodium: 143 mEq/L (ref 136–145)

## 2020-08-12 LAB — LIPID PANEL
Cholesterol / HDL Ratio: 2.4
Cholesterol: 175 mg/dL (ref 0–199)
HDL: 72 mg/dL (ref 40–9999)
LDL Calculated: 85 mg/dL (ref 0–99)
Triglycerides: 92 mg/dL (ref 34–149)
VLDL Calculated: 18 mg/dL (ref 10–40)

## 2020-08-12 LAB — MICROALBUMIN, RANDOM URINE
Urine Creatinine, Random: 119.9 mg/dL
Urine Microalbumin, Random: 10 (ref 0.0–30.0)
Urine Microalbumin/Creatinine Ratio: 8 ug/mg (ref 0–30)

## 2020-08-12 LAB — HEMOGLOBIN A1C
Average Estimated Glucose: 114 mg/dL
Hemoglobin A1C: 5.6 % (ref 4.6–5.9)

## 2020-08-12 LAB — T4, FREE: T4 Free: 0.83 ng/dL (ref 0.70–1.48)

## 2020-08-12 LAB — TSH: TSH: 1.6 u[IU]/mL (ref 0.35–4.94)

## 2020-08-12 LAB — GFR: EGFR: >60

## 2020-08-15 NOTE — Progress Notes (Signed)
[place] [facility] - [facility] [place] [facility]                       Date of Exam: 08/18/2020 10:29 AM        Patient ID: Claudia Adams is a 74 y.o. female.  Attending Physician: Larna Daughters, PA        Chief Complaint:    Chief Complaint   Patient presents with    Diabetes    Hypertension    Hyperlipidemia    Hypothyroidism               HPI:    1. TYPE II DIABETES MELLITUS/HLD  - Now followed by endocrinology for this  - On Tresiba 15 U daily and Ozempic 1 mg once per week  - Has had the pneumonia vaccine and COVID vaccine   - Has neuropathy in feet for which she takes gabapentin 300 mg TID  - Taking Livalo 1 mg daily for HLD  - Would like to also establish with a cardiologist for baseline testing    2. HYPOTHYROIDISM:   - Now followed by endocrinology  - Taking Synthroid 25 mcg daily   - Denies palpitations, cold/heat intolerance, constipation    3. HYPERTENSION  - Taking Losartan-HCTZ 100-25 mg  - Has not been monitoring her BP at home regularly   - Denies chest pain, palpitations, lower extremity edema or SOB  - Tolerating her medication well  - No history of heart disease  - Had a nuclear stress test and carotid doppler in the past which were normal  - Would like to see cardiology for an annual check in    4. RHEUMATOID ARTHRITIS  - First diagnosed about 15 years ago   - Followed now by rheumatology in our area, does not recall the providers name  - Currently on Remicade infusions every 6 weeks  - Also uses diclofenac PRN, hydroxychloroquine daily, methotrexate weekly   - Tramadol is PRN for her pain  - S/p right total knee replacement in 2021  - In PT now and states she's doing well     5. ANXIETY  - Takes Lexapro 20 mg daily and Abilify 2 mg daily  - Previously followed by psychiatry in [place] and still plans to establish with a provider in the area if needed  - Using Ativan 1 mg PRN and states her panic attacks have recently been better controlled  - Using Trazodone 50 mg  nightly as needed  - Requesting a refill today  - Lost her husband a few years ago   - Recently relocated to be closer to family   - Is under a lot of stress    6. DECREASED GFR  - Has a history of chronic kidney disease stage 3  - Has seen Dr. Roswell Nickel since our last visit   - Has a "kidney scan" planned for tomorrow            Problem List:    Patient Active Problem List   Diagnosis    Seasonal allergic rhinitis    Rheumatoid arthritis    Type 2 diabetes mellitus with diabetic polyneuropathy, with long-term current use of insulin    Essential hypertension    Hyperlipidemia, mixed    Hypothyroidism, unspecified type    Anxiety and depression             Current Meds:    Outpatient Medications Marked as Taking for the 08/18/20  encounter (Office Visit) with Charleen Madera S, PA   Medication Sig Dispense Refill    Accu-Chek SmartView test strip Monitor blood glucose once daily, Dx: E11.42 100 each 11    ARIPiprazole (ABILIFY) 2 MG tablet Take 2 mg by mouth daily      ASPIRIN 81 PO aspirin      BD Pen Needle Nano 2nd Gen 32G X 4 MM Misc Use once daily as directed. 100 each 1    Diclofenac Sodium 1.5 % Solution Apply topically 2 (two) times daily         escitalopram (LEXAPRO) 20 MG tablet Take 20 mg by mouth daily      fluticasone (FLONASE) 50 MCG/ACT nasal spray 1 spray by Nasal route daily 3 Bottle 1    folic acid (FOLVITE) 1 MG tablet TAKE 1 TABLET BY MOUTH EVERY DAY 90 tablet 1    gabapentin (NEURONTIN) 300 MG capsule TAKE 1 CAPSULE BY MOUTH THREE TIMES A DAY 270 capsule 0    hydroxychloroquine (PLAQUENIL) 200 MG tablet Take 200 mg by mouth daily         inFLIXimab (REMICADE IV) once every six weeks         KLOR-CON M 10 MEQ tablet Take 10 mEq by mouth daily         levothyroxine (SYNTHROID) 25 MCG tablet Take 1 tablet (25 mcg total) by mouth Once a day at 6:00am 90 tablet 0    LORazepam (ATIVAN) 1 MG tablet lorazepam 1 mg tablet   TAKE 1 TABLET BY MOUTH TWICE A DAY AS NEEDED       losartan-hydrochlorothiazide (HYZAAR) 100-25 MG per tablet TAKE 1 TABLET BY MOUTH DAILY 90 tablet 1    methotrexate 2.5 MG tablet Takes 6 tablets once a week        metoprolol succinate XL (TOPROL-XL) 50 MG 24 hr tablet TAKE 1 TABLET BY MOUTH EVERY DAY 90 tablet 1    Semaglutide, 1 MG/DOSE, (Ozempic, 1 MG/DOSE,) 4 MG/3ML Solution Pen-injector Inject 1 mg into the skin once a week Once a week 9 mL 1    Semaglutide,0.25 or 0.5MG /DOS, (Ozempic, 0.25 or 0.5 MG/DOSE,) 2 MG/1.5ML Solution Pen-injector Inject 0.25 mg into the skin once a week Take 0.25 mg SC weekly x 2 weeks, then increase to 0.5 mg SC weekly x 2 weeks 1.5 mL 0    traMADol (ULTRAM) 50 MG tablet Take 50 mg by mouth every 6 (six) hours as needed for Pain      Tresiba FlexTouch 100 UNIT/ML Solution Pen-injector ADMINISTER 20 UNITS UNDER THE SKIN DAILY 6 mL 2          Allergies:    Allergies   Allergen Reactions    Penicillin G      Reports allergic as a child               Past Surgical History:    Past Surgical History:   Procedure Laterality Date    KNEE SURGERY Right 11/30/2019    knee replacement by Dr. Harlene Salts    SHOULDER SURGERY Right 2019    Rotator cuff           Family History:    Family History   Problem Relation Age of Onset    No known problems Mother     No known problems Father            Social History:    Social History     Tobacco Use  Smoking status: Never Smoker    Smokeless tobacco: Never Used   Vaping Use    Vaping Use: Never used   Substance Use Topics    Alcohol use: Not Currently    Drug use: Never          The following sections were reviewed this encounter by the provider:   Tobacco   Allergies   Meds   Problems   Med Hx   Surg Hx   Fam Hx              Vital Signs:    BP 124/78 (BP Site: Left arm, Patient Position: Sitting, Cuff Size: Small)    Pulse 70    Temp 97 F (36.1 C) (Temporal)    Wt 74.8 kg (165 lb)    SpO2 99%    BMI 31.18 kg/m          ROS:    Review of Systems   Constitutional: Negative for  activity change, appetite change and chills.   Respiratory: Negative for cough, shortness of breath and wheezing.    Cardiovascular: Negative for chest pain and palpitations.   Gastrointestinal: Negative for abdominal pain, constipation and diarrhea.   Genitourinary: Negative for dysuria, flank pain and frequency.   Musculoskeletal: Negative for back pain, myalgias and neck pain.   Skin: Negative for rash.   Neurological: Negative for dizziness, weakness and numbness.              Physical Exam:    Physical Exam  Constitutional:       Appearance: Normal appearance.   HENT:      Head: Normocephalic and atraumatic.   Cardiovascular:      Rate and Rhythm: Normal rate and regular rhythm.      Pulses: Normal pulses.      Heart sounds: Normal heart sounds.   Pulmonary:      Effort: Pulmonary effort is normal. No respiratory distress.      Breath sounds: Normal breath sounds.   Musculoskeletal:      Cervical back: Normal range of motion and neck supple.   Neurological:      General: No focal deficit present.      Mental Status: She is alert.   Psychiatric:         Mood and Affect: Mood normal.         Behavior: Behavior normal.         Thought Content: Thought content normal.              Assessment/Plan:    1. Type 2 diabetes mellitus with diabetic polyneuropathy, with long-term current use of insulin  2. Hypothyroidism, unspecified type  - Now followed by endocrinology for these  - Reviewed recent lab work results with the patient  - UTD with routine immunizations    3. Essential hypertension  - BP well controlled at this time  - Plan to continue with the present medication/dose  - Discussed signs/symptoms and possible etiologies including lifestyle and genetic factors  - Recommend monitoring BP at home and working on a low salt diet    4. Hyperlipidemia, mixed  - Reviewed recent lipid results with the patient  - Provided a list of cardiology groups in the area for her annual exam    5. Rheumatoid arthritis involving  multiple sites, unspecified whether rheumatoid factor present  - Followed by rheumatology for this    6. Anxiety and depression  - Well controlled at this time  -  Will continue with the present medication and dosage  - Instructed to take medication daily and do not stop abruptly   - Discussed benefits of regular exercise, well balanced diet and importance of sleep hygiene  - Plan to recheck every six months     7. Insomnia, unspecified type  - traZODone (DESYREL) 50 MG tablet; Take 1 tablet (50 mg total) by mouth nightly as needed for Sleep  Dispense: 30 tablet; Refill: 5    - Stable at this time  - Discussed signs/symptoms of insomnia  - Discussed the importance of sleep hygiene                Follow-up:    Return in about 4 weeks (around 09/15/2020) for Annual wellness exam.         Rayola Everhart S Tyia Binford, PA

## 2020-08-18 ENCOUNTER — Ambulatory Visit (INDEPENDENT_AMBULATORY_CARE_PROVIDER_SITE_OTHER): Payer: Medicare Other | Admitting: Physician Assistant

## 2020-08-18 ENCOUNTER — Encounter (INDEPENDENT_AMBULATORY_CARE_PROVIDER_SITE_OTHER): Payer: Self-pay | Admitting: Physician Assistant

## 2020-08-18 VITALS — BP 124/78 | HR 70 | Temp 97.0°F | Wt 165.0 lb

## 2020-08-18 DIAGNOSIS — F32A Depression, unspecified: Secondary | ICD-10-CM

## 2020-08-18 DIAGNOSIS — I10 Essential (primary) hypertension: Secondary | ICD-10-CM

## 2020-08-18 DIAGNOSIS — F419 Anxiety disorder, unspecified: Secondary | ICD-10-CM

## 2020-08-18 DIAGNOSIS — E039 Hypothyroidism, unspecified: Secondary | ICD-10-CM

## 2020-08-18 DIAGNOSIS — G4700 Insomnia, unspecified: Secondary | ICD-10-CM

## 2020-08-18 DIAGNOSIS — E1142 Type 2 diabetes mellitus with diabetic polyneuropathy: Secondary | ICD-10-CM

## 2020-08-18 DIAGNOSIS — Z794 Long term (current) use of insulin: Secondary | ICD-10-CM

## 2020-08-18 DIAGNOSIS — E782 Mixed hyperlipidemia: Secondary | ICD-10-CM

## 2020-08-18 DIAGNOSIS — M069 Rheumatoid arthritis, unspecified: Secondary | ICD-10-CM

## 2020-08-18 MED ORDER — TRAZODONE HCL 50 MG PO TABS
50.0000 mg | ORAL_TABLET | Freq: Every evening | ORAL | 5 refills | Status: DC | PRN
Start: 2020-08-18 — End: 2021-01-26

## 2020-08-19 ENCOUNTER — Ambulatory Visit (INDEPENDENT_AMBULATORY_CARE_PROVIDER_SITE_OTHER): Payer: BLUE CROSS/BLUE SHIELD | Admitting: "Endocrinology

## 2020-08-21 ENCOUNTER — Other Ambulatory Visit (INDEPENDENT_AMBULATORY_CARE_PROVIDER_SITE_OTHER): Payer: Self-pay | Admitting: "Endocrinology

## 2020-08-21 DIAGNOSIS — E1142 Type 2 diabetes mellitus with diabetic polyneuropathy: Secondary | ICD-10-CM

## 2020-08-21 MED ORDER — OZEMPIC (1 MG/DOSE) 4 MG/3ML SC SOPN
1.0000 mg | PEN_INJECTOR | SUBCUTANEOUS | 1 refills | Status: DC
Start: 2020-08-21 — End: 2021-03-26

## 2020-08-26 ENCOUNTER — Encounter (INDEPENDENT_AMBULATORY_CARE_PROVIDER_SITE_OTHER): Payer: Self-pay | Admitting: "Endocrinology

## 2020-08-26 ENCOUNTER — Ambulatory Visit (INDEPENDENT_AMBULATORY_CARE_PROVIDER_SITE_OTHER): Payer: Medicare Other | Admitting: "Endocrinology

## 2020-08-26 VITALS — BP 145/80 | HR 75 | Resp 13 | Ht 61.0 in | Wt 165.0 lb

## 2020-08-26 DIAGNOSIS — E039 Hypothyroidism, unspecified: Secondary | ICD-10-CM

## 2020-08-26 DIAGNOSIS — E782 Mixed hyperlipidemia: Secondary | ICD-10-CM

## 2020-08-26 DIAGNOSIS — Z794 Long term (current) use of insulin: Secondary | ICD-10-CM

## 2020-08-26 DIAGNOSIS — E1142 Type 2 diabetes mellitus with diabetic polyneuropathy: Secondary | ICD-10-CM

## 2020-08-26 MED ORDER — LEVOTHYROXINE SODIUM 25 MCG PO TABS
25.0000 ug | ORAL_TABLET | Freq: Every day | ORAL | 0 refills | Status: DC
Start: 2020-08-26 — End: 2020-12-31

## 2020-08-26 MED ORDER — TRESIBA FLEXTOUCH 100 UNIT/ML SC SOPN
15.0000 [IU] | PEN_INJECTOR | Freq: Every day | SUBCUTANEOUS | 1 refills | Status: DC
Start: 2020-08-26 — End: 2020-10-31

## 2020-08-26 NOTE — Progress Notes (Signed)
Subjective:      Date: 08/26/2020 2:55 PM   Patient ID: Claudia Adams is a 74 y.o. female.    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d       HPI  Visit type: follow up   Type: Type II and insulin requiring  Evaluation: diagnosed 20 years ago   Last follow-up: 07/08/20    Diabetes Medication Regimen: Ozempic 1 mg weekly , Tresiba 20 units every evening     Medication effectiveness/adherence: working well and general adherence with medications  Medication side effects: none  Presenting symptoms at time of diagnosis: abnormal screening glucose     Prior medications: Metformin d/c due to previously low  kidney function     Interval events:   S/p right knee replacement, July 2021, completed PT   Has residual right knee pain   Has RA   Has ckd and is following with Nephro, current GFR has normalized and her renal usg was canceled     Current symptoms: tingling/numbness in extremities  Patient denies: no other symptoms including chest pain, shortness of breath, hypoglycemic episodes, polydipsia, polyuria, headaches, dizziness, or abdominal pain  Exercise: none  Diet: moderate compliance with recommended diet   Diet recall   Bruch: ensure, yogurt or boiled egg and 1 pc of toast  Dinner: microwable high protein foods from [employer]     Complications: diabetic peripheral neuropathy  Pertinent medical history: hyperlipidemia, hypertension, hypothyroidism   Home glucose readings: Download glucose meter :  2 weeks period average: 128;  Lowest BS 114; highest BS 139      A1c result: 5.6 (08/12/20)   A1c trend is decreasing and at goal  Microalbumin trend: No results found for: MICROALBUMIN  Microalbumin trend is negative  LDL trend:   Lab Results   Component Value Date    LDL 85 08/12/2020    LDL 71 05/21/2020    LDL 99 12/12/2019     LDL trend is at goal  Additional concerns: Pt has stable essential HTN with no evidence of CHF or proteinuria.   The patient is compliant with anti-hypertensive therapy and denies side effects to therapy.  Pt denies CP, SOB, dizziness, orthopnea, PND or edema.  Pt has hyperlipidemia and is compliant with lipid therapy.  Pt denies side effects of lipid therapy - specifically denies myalgia.  Pt has hypothyroidism - compliant with current regimen.  Denies side effects to therapy.  Denies any change in hair/nails/skin, constipation, unexplained wt changes.        Problem List:  Patient Active Problem List   Diagnosis    Seasonal allergic rhinitis    Rheumatoid arthritis    Type 2 diabetes mellitus with diabetic polyneuropathy, with long-term current use of insulin    Essential hypertension    Hyperlipidemia, mixed    Hypothyroidism, unspecified type    Anxiety and depression       Current Medications:  Current Outpatient Medications   Medication Sig Dispense Refill    Accu-Chek SmartView test strip Monitor blood glucose once daily, Dx: E11.42 100 each 11    ARIPiprazole (ABILIFY) 2 MG tablet Take 2 mg by mouth daily      ASPIRIN 81 PO aspirin      BD Pen Needle Nano 2nd Gen 32G X 4 MM Misc Use once daily as directed. 100 each 1    Diclofenac Sodium 1.5 % Solution  Apply topically 2 (two) times daily         escitalopram (LEXAPRO) 20 MG tablet Take 20 mg by mouth daily      fluticasone (FLONASE) 50 MCG/ACT nasal spray 1 spray by Nasal route daily 3 Bottle 1    folic acid (FOLVITE) 1 MG tablet TAKE 1 TABLET BY MOUTH EVERY DAY 90 tablet 1    gabapentin (NEURONTIN) 300 MG capsule TAKE 1 CAPSULE BY MOUTH THREE TIMES A DAY 270 capsule 0    hydroxychloroquine (PLAQUENIL) 200 MG tablet Take 200 mg by mouth daily         inFLIXimab (REMICADE IV) once every six weeks         KLOR-CON M 10 MEQ tablet Take 10 mEq by mouth daily         levothyroxine (SYNTHROID) 25 MCG tablet Take 1 tablet (25 mcg total) by mouth Once a day at 6:00am 90 tablet 0    LORazepam (ATIVAN) 1 MG tablet lorazepam 1 mg tablet   TAKE 1 TABLET BY  MOUTH TWICE A DAY AS NEEDED      losartan-hydrochlorothiazide (HYZAAR) 100-25 MG per tablet TAKE 1 TABLET BY MOUTH DAILY 90 tablet 1    methotrexate 2.5 MG tablet Takes 6 tablets once a week        metoprolol succinate XL (TOPROL-XL) 50 MG 24 hr tablet TAKE 1 TABLET BY MOUTH EVERY DAY 90 tablet 1    Pitavastatin Magnesium 1 MG Tab Take by mouth      Semaglutide, 1 MG/DOSE, (Ozempic, 1 MG/DOSE,) 4 MG/3ML Solution Pen-injector Inject 1 mg into the skin once a week Once a week 9 mL 1    traMADol (ULTRAM) 50 MG tablet Take 50 mg by mouth every 6 (six) hours as needed for Pain      traZODone (DESYREL) 50 MG tablet Take 1 tablet (50 mg total) by mouth nightly as needed for Sleep 30 tablet 5    Tresiba FlexTouch 100 UNIT/ML Solution Pen-injector ADMINISTER 20 UNITS UNDER THE SKIN DAILY 6 mL 2     No current facility-administered medications for this visit.       Allergies:  Allergies   Allergen Reactions    Penicillin G      Reports allergic as a child         Past Medical History:  Past Medical History:   Diagnosis Date    Anxiety     Diabetes mellitus     Disorder of thyroid     Hypertension     Rheumatoid arthritis     Seasonal allergic rhinitis        Past Surgical History:  Past Surgical History:   Procedure Laterality Date    KNEE SURGERY Right 11/30/2019    knee replacement by Dr. Harlene Salts    SHOULDER SURGERY Right 2019    Rotator cuff       Family History:  Family History   Problem Relation Age of Onset    No known problems Mother     No known problems Father        Social History:  Social History     Socioeconomic History    Marital status: Widowed   Tobacco Use    Smoking status: Never Smoker    Smokeless tobacco: Never Used   Vaping Use    Vaping Use: Never used   Substance and Sexual Activity    Alcohol use: Not Currently    Drug use: Never  The following sections were reviewed this encounter by the provider:   Tobacco   Allergies   Meds   Problems            Vitals:  BP  145/80 (BP Site: Right arm, Patient Position: Sitting, Cuff Size: Medium)    Pulse 75    Resp 13    Ht 1.549 m (5\' 1" )    Wt 74.8 kg (165 lb)    SpO2 99%    BMI 31.18 kg/m      ROS: A complete 12 point ROS was obtained. Pertinent positives and negatives as noted in HPI. All other systems are negative.    Physical Examination     General appearance: alert, appears stated age, cooperative and no distress  Eyes: conjunctivae/corneas clear. PERRL, EOM's intact.   Neck: no cervical lymphadenopathy, no carotid bruit,  supple, symmetrical, trachea midline   Thyroid:normal sized on inspection; non tender to palpation and no discrete nodules palpated  Lungs: clear to auscultation bilaterally  Heart: regular rate and rhythm, S1, S2 normal, no murmur, click, rub or gallop  Abdomen: soft, non-tender; bowel sounds normal; no masses,  no organomegaly  Extremities: extremities normal, atraumatic, no cyanosis or edema  Foot Exam: monofilament testing of plantar aspect of first toe and metatarsal joints (10g monofilament) intact bilaterally, no fissures, maceration, ulceration or lesions bilaterally, normal hair growth bilaterally, normal pigmentation bilaterally and vibratory perception (128 Hz tuning fork) applied to first toe intact bilaterally; Pedal pulses 2+ bilaterally  Skin: Skin color, texture, turgor normal. No rashes or lesions  Neurologic: Alert and oriented X 3, normal strength and tone. Normal symmetric reflexes. No tremor    Assessment and Plan     1. Type 2 diabetes mellitus with diabetic polyneuropathy, with long-term current use of insulin  -   Lab Results   Component Value Date    HGBA1C 5.6 08/12/2020    HGBA1C 7.0 (H) 03/12/2020    HGBA1C 6.2 (H) 12/12/2019       Current A1C has normalized compared to prior values. A1C trend has significantly improved compared to prior values.   -  Continue current DM medication regimen. except decrease Tresiba to 15 units HS.   -  Recommend home glucometer testing twice a day  (pre-breakfast or 2 hrs post-prandial).   - continue carb controlled diet and stay physically active as tolerated     - Insulin Degludec Evaristo Bury FlexTouch) 100 UNIT/ML Solution Pen-injector; Inject 15 Units into the skin daily  Dispense: 15 mL; Refill: 1  - Urine Microalbumin Random; Future  - Hemoglobin A1C; Future  - Comprehensive metabolic panel; Future    2. Hypothyroidism, unspecified type  - euthyroid on curreht dose Levothyroxine     - levothyroxine (SYNTHROID) 25 MCG tablet; Take 1 tablet (25 mcg total) by mouth Once a day at 6:00am  Dispense: 90 tablet; Refill: 0  - TSH; Future  - T4, free; Future    3. Mixed hyperlipidemia  - LDL at goal   - continue current statin therapy     - Comprehensive metabolic panel; Future  - Lipid panel; Future     4. Hypertension   - POCT BP sub optimal  - will continue to monitor and recommend ambulatory BP monitoring   - BP goal < 130/80     - on Losartan-HCTZ 100-25 mg daily, metoprolol XL 50 mg daily     RTC in 12 weeks     Bynum Bellows MSN FNP-BC  [facility]

## 2020-08-26 NOTE — Patient Instructions (Addendum)
Decrease Tresiba to 15 units nightly     Continue Ozempic 1 mg weekly

## 2020-09-01 ENCOUNTER — Other Ambulatory Visit (INDEPENDENT_AMBULATORY_CARE_PROVIDER_SITE_OTHER): Payer: Self-pay | Admitting: Physician Assistant

## 2020-09-01 DIAGNOSIS — M069 Rheumatoid arthritis, unspecified: Secondary | ICD-10-CM

## 2020-09-01 DIAGNOSIS — E876 Hypokalemia: Secondary | ICD-10-CM

## 2020-09-01 MED ORDER — KLOR-CON M10 10 MEQ PO TBCR
10.0000 meq | EXTENDED_RELEASE_TABLET | Freq: Every day | ORAL | 1 refills | Status: DC
Start: 2020-09-01 — End: 2020-12-04

## 2020-09-01 MED ORDER — GABAPENTIN 300 MG PO CAPS
ORAL_CAPSULE | ORAL | 1 refills | Status: DC
Start: 2020-09-01 — End: 2021-02-10

## 2020-09-01 NOTE — Telephone Encounter (Signed)
You saw pt on 08/18/20.    She is following endocrinology for her other DM meds, but is requesting you refill the gabapentin. Please advise    She has been taking Klor-Con for at least 2 years now for chronically low potassium. Last blood draw on 08/12/20 returned normal with K+ of 3.9, and she was taking the tablet consistently during that time. Could you please refill?

## 2020-09-01 NOTE — Telephone Encounter (Signed)
Rx sent electronically.  

## 2020-09-13 ENCOUNTER — Other Ambulatory Visit (INDEPENDENT_AMBULATORY_CARE_PROVIDER_SITE_OTHER): Payer: Self-pay | Admitting: Physician Assistant

## 2020-09-29 ENCOUNTER — Other Ambulatory Visit: Payer: Self-pay | Admitting: Rheumatology

## 2020-09-30 ENCOUNTER — Other Ambulatory Visit (INDEPENDENT_AMBULATORY_CARE_PROVIDER_SITE_OTHER): Payer: Self-pay | Admitting: Physician Assistant

## 2020-09-30 DIAGNOSIS — E039 Hypothyroidism, unspecified: Secondary | ICD-10-CM

## 2020-10-03 ENCOUNTER — Other Ambulatory Visit (INDEPENDENT_AMBULATORY_CARE_PROVIDER_SITE_OTHER): Payer: Self-pay | Admitting: Physician Assistant

## 2020-10-03 DIAGNOSIS — E1142 Type 2 diabetes mellitus with diabetic polyneuropathy: Secondary | ICD-10-CM

## 2020-10-14 ENCOUNTER — Encounter (INDEPENDENT_AMBULATORY_CARE_PROVIDER_SITE_OTHER): Payer: Self-pay | Admitting: Physician Assistant

## 2020-10-31 ENCOUNTER — Other Ambulatory Visit (INDEPENDENT_AMBULATORY_CARE_PROVIDER_SITE_OTHER): Payer: Self-pay

## 2020-10-31 DIAGNOSIS — Z794 Long term (current) use of insulin: Secondary | ICD-10-CM

## 2020-10-31 NOTE — Telephone Encounter (Signed)
Tried calling [facility]  to see if recent script on file no one answered   sent refill request to [place] [facility]     patient informed

## 2020-11-03 MED ORDER — TRESIBA FLEXTOUCH 100 UNIT/ML SC SOPN
20.0000 [IU] | PEN_INJECTOR | Freq: Every day | SUBCUTANEOUS | 1 refills | Status: DC
Start: 2020-11-03 — End: 2021-03-26

## 2020-11-25 ENCOUNTER — Encounter (INDEPENDENT_AMBULATORY_CARE_PROVIDER_SITE_OTHER): Payer: Self-pay | Admitting: "Endocrinology

## 2020-11-25 ENCOUNTER — Ambulatory Visit (INDEPENDENT_AMBULATORY_CARE_PROVIDER_SITE_OTHER): Payer: Medicare Other | Admitting: "Endocrinology

## 2020-11-25 VITALS — BP 107/70 | HR 78 | Resp 14 | Ht 61.0 in | Wt 172.0 lb

## 2020-11-25 DIAGNOSIS — E1142 Type 2 diabetes mellitus with diabetic polyneuropathy: Secondary | ICD-10-CM

## 2020-11-25 DIAGNOSIS — E039 Hypothyroidism, unspecified: Secondary | ICD-10-CM

## 2020-11-25 DIAGNOSIS — Z794 Long term (current) use of insulin: Secondary | ICD-10-CM

## 2020-11-25 DIAGNOSIS — E782 Mixed hyperlipidemia: Secondary | ICD-10-CM

## 2020-11-25 LAB — MICROALBUMIN, RANDOM URINE
Urine Creatinine, Random: 162 mg/dL
Urine Microalbumin, Random: 6 (ref 0.0–30.0)
Urine Microalbumin/Creatinine Ratio: 4 ug/mg (ref 0–30)

## 2020-11-25 LAB — HEMOLYSIS INDEX: Hemolysis Index: 16 (ref 0–24)

## 2020-11-25 LAB — LIPID PANEL
Cholesterol / HDL Ratio: 2.6
Cholesterol: 183 mg/dL (ref 0–199)
HDL: 70 mg/dL (ref 40–9999)
LDL Calculated: 96 mg/dL (ref 0–99)
Triglycerides: 85 mg/dL (ref 34–149)
VLDL Calculated: 17 mg/dL (ref 10–40)

## 2020-11-25 LAB — HEMOGLOBIN A1C
Average Estimated Glucose: 119.8 mg/dL
Hemoglobin A1C: 5.8 % (ref 4.6–5.9)

## 2020-11-25 LAB — COMPREHENSIVE METABOLIC PANEL
ALT: 19 U/L (ref 0–55)
AST (SGOT): 29 U/L (ref 5–34)
Albumin/Globulin Ratio: 1.5 (ref 0.9–2.2)
Albumin: 3.8 g/dL (ref 3.5–5.0)
Alkaline Phosphatase: 69 U/L (ref 37–117)
Anion Gap: 10 (ref 5.0–15.0)
BUN: 18 mg/dL (ref 7.0–19.0)
Bilirubin, Total: 0.8 mg/dL (ref 0.2–1.2)
CO2: 27 mEq/L (ref 21–29)
Calcium: 9.1 mg/dL (ref 7.9–10.2)
Chloride: 103 mEq/L (ref 100–111)
Creatinine: 1.2 mg/dL (ref 0.4–1.5)
Globulin: 2.6 g/dL (ref 2.0–3.7)
Glucose: 159 mg/dL — ABNORMAL HIGH (ref 70–100)
Potassium: 4.3 mEq/L (ref 3.5–5.1)
Protein, Total: 6.4 g/dL (ref 6.0–8.3)
Sodium: 140 mEq/L (ref 136–145)

## 2020-11-25 LAB — TSH: TSH: 0.96 u[IU]/mL (ref 0.35–4.94)

## 2020-11-25 LAB — T4, FREE: T4 Free: 0.89 ng/dL (ref 0.70–1.48)

## 2020-11-25 LAB — GFR: EGFR: 53

## 2020-11-25 NOTE — Progress Notes (Signed)
Subjective:      Date: 11/25/2020 1:44 PM   Patient ID: Claudia Adams is a 75 y.o. female.    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       HPI  Visit type: follow up   Type: Type II and insulin requiring  Evaluation: diagnosed 20 years ago   Last follow-up: 07/08/20    Diabetes Medication Regimen: Ozempic 1 mg weekly , Tresiba 20 units every evening   Medication effectiveness/adherence: working well and general adherence with medications  Medication side effects: none  Presenting symptoms at time of diagnosis: abnormal screening glucose     Prior medications: Metformin d/c due to previously low  kidney function     Interval events:   S/p right knee replacement, July 2021, completed PT   Has residual right knee pain   Has RA and recently was told by her Rheumatologist that she also has osteoarthritis   Is currently doing PT for joint pain, 2-3X weekly    Was discharged from Nephro recently due to stable kidney function     Current symptoms: tingling/numbness in extremities - not worse from prior and N[place] is helping   Patient denies: no other symptoms including chest pain, shortness of breath, hypoglycemic episodes, polydipsia, polyuria, headaches, dizziness, or abdominal pain  Exercise: none currently on PT   Diet: moderate compliance with recommended diet   Diet recall   Bruch: ensure, yogurt or boiled egg and 1 pc of toast  Dinner: microwable high protein foods from [employer]   Has cut out juice and does not drink soda   Complications: diabetic peripheral neuropathy  Pertinent medical history: hyperlipidemia, hypertension, hypothyroidism   Home glucose readings: Recall fasting : low 100s;   A1c result: 5.8 (11/25/20)    A1c trend is at goal  Microalbumin trend: No results found for: MICROALBUMIN  Microalbumin trend is negative  LDL trend:   Lab Results   Component Value Date    LDL 85 08/12/2020    LDL 71 05/21/2020    LDL 99  12/12/2019     LDL trend is at goal  Additional concerns: Pt has stable essential HTN with no evidence of CHF or proteinuria.  The patient is compliant with anti-hypertensive therapy and denies side effects to therapy.  Pt denies CP, SOB, dizziness, orthopnea, PND or edema.  Pt has hyperlipidemia and is compliant with lipid therapy.  Pt denies side effects of lipid therapy - specifically denies myalgia.  Pt has hypothyroidism - compliant with current regimen.  Denies side effects to therapy.  Denies any change in hair/nails/skin, constipation, unexplained wt changes.        Problem List:  Patient Active Problem List   Diagnosis    Seasonal allergic rhinitis    Rheumatoid arthritis    Type 2 diabetes mellitus with diabetic polyneuropathy, with long-term current use of insulin    Essential hypertension    Hyperlipidemia, mixed    Hypothyroidism, unspecified type    Anxiety and depression       Current Medications:  Current Outpatient Medications   Medication Sig Dispense Refill    Accu-Chek SmartView test strip Monitor blood glucose once daily, Dx: E11.42 100 each 11    ARIPiprazole (ABILIFY) 2 MG tablet Take 2 mg by mouth daily      BD Pen Needle Nano 2nd Gen 32G X 4 MM Misc Use once daily  as directed. 100 each 1    escitalopram (LEXAPRO) 20 MG tablet Take 20 mg by mouth daily      fluticasone (FLONASE) 50 MCG/ACT nasal spray 1 spray by Nasal route as needed for Allergies 18 mL 2    folic acid (FOLVITE) 1 MG tablet TAKE 1 TABLET BY MOUTH EVERY DAY 90 tablet 1    gabapentin (NEURONTIN) 300 MG capsule TAKE 1 CAPSULE BY MOUTH THREE TIMES A DAY 270 capsule 1    hydroxychloroquine (PLAQUENIL) 200 MG tablet Take 200 mg by mouth daily         inFLIXimab (REMICADE IV) once every six weeks         Insulin Degludec Evaristo Bury FlexTouch) 100 UNIT/ML Solution Pen-injector Inject 20 Units into the skin daily 15 mL 1    KLOR-CON M 10 MEQ tablet Take 1 tablet (10 mEq total) by mouth daily 90 tablet 1     levothyroxine (SYNTHROID) 25 MCG tablet Take 1 tablet (25 mcg total) by mouth Once a day at 6:00am 90 tablet 0    losartan-hydrochlorothiazide (HYZAAR) 100-25 MG per tablet TAKE 1 TABLET BY MOUTH DAILY 90 tablet 1    methotrexate 2.5 MG tablet Takes 6 tablets once a week        metoprolol succinate XL (TOPROL-XL) 50 MG 24 hr tablet TAKE 1 TABLET BY MOUTH EVERY DAY 90 tablet 1    Multiple Vitamins-Minerals (Womens 50+ Multi Vitamin/Min) Tab Take 1 tablet by mouth daily      Pitavastatin Magnesium 1 MG Tab Take by mouth      Semaglutide, 1 MG/DOSE, (Ozempic, 1 MG/DOSE,) 4 MG/3ML Solution Pen-injector Inject 1 mg into the skin once a week Once a week 9 mL 1    traMADol (ULTRAM) 50 MG tablet Take 50 mg by mouth every 6 (six) hours as needed for Pain      traZODone (DESYREL) 50 MG tablet Take 1 tablet (50 mg total) by mouth nightly as needed for Sleep 30 tablet 5     No current facility-administered medications for this visit.       Allergies:  Allergies   Allergen Reactions    Penicillin G      Reports allergic as a child         Past Medical History:  Past Medical History:   Diagnosis Date    Anxiety     Diabetes mellitus     Disorder of thyroid     Hypertension     Rheumatoid arthritis     Seasonal allergic rhinitis        Past Surgical History:  Past Surgical History:   Procedure Laterality Date    KNEE SURGERY Right 11/30/2019    knee replacement by Dr. Harlene Salts    SHOULDER SURGERY Right 2019    Rotator cuff       Family History:  Family History   Problem Relation Age of Onset    No known problems Mother     No known problems Father        Social History:  Social History     Socioeconomic History    Marital status: Widowed   Tobacco Use    Smoking status: Never Smoker    Smokeless tobacco: Never Used   Vaping Use    Vaping Use: Never used   Substance and Sexual Activity    Alcohol use: Not Currently    Drug use: Never        The following sections were  reviewed this encounter by the  provider:   Tobacco   Allergies   Meds   Problems            Vitals:  BP 107/70 (BP Site: Left arm, Patient Position: Sitting, Cuff Size: Large)    Pulse 78    Resp 14    Ht 1.549 m (5\' 1" )    Wt 78 kg (172 lb)    SpO2 98%    BMI 32.50 kg/m      ROS: A complete 12 point ROS was obtained. Pertinent positives and negatives as noted in HPI. All other systems are negative.    Physical Examination     General appearance: alert, appears stated age, cooperative and no distress  Eyes: conjunctivae/corneas clear. PERRL, EOM's intact.   Neck: no cervical lymphadenopathy, no carotid bruit,  supple, symmetrical, trachea midline   Thyroid:normal sized on inspection; non tender to palpation and no discrete nodules palpated  Lungs: clear to auscultation bilaterally  Heart: regular rate and rhythm, S1, S2 normal, no murmur, click, rub or gallop  Abdomen: soft, non-tender; bowel sounds normal; no masses,  no organomegaly  Extremities: extremities normal, atraumatic, no cyanosis or edema  Skin: Skin color, texture, turgor normal. No rashes or lesions  Neurologic: Alert and oriented X 3, normal strength and tone. Normal symmetric reflexes. No tremor    Assessment and Plan     1. Type 2 diabetes mellitus with diabetic polyneuropathy, with long-term current use of insulin  -   Lab Results   Component Value Date    HGBA1C 5.6 08/12/2020    HGBA1C 7.0 (H) 03/12/2020    HGBA1C 6.2 (H) 12/12/2019       Current A1C has normalized compared to prior values.   - previously recommended to decrease Tresiba to 15 units HS  - plan to stop [place] after review of labs  -   Continue current DM medication regimen. Recommend adding the following DM medication class to the current regimen - SGLT-2 Inhibitor.  Side effects of SGLT2 inhibitor (UTI, mycotic infection, polyuria, change in renal function, hypotension) discussed with patient.   -  Recommend home glucometer testing twice a day (pre-breakfast or 2 hrs post-prandial).    - continue carb control  diet and stay physically active as tolerated     - Urine Microalbumin Random  - Hemoglobin A1C  - Comprehensive metabolic panel    2. Hypothyroidism, unspecified type  - euthyroid on current dose Levothyroxine 25 mcg daily   - continue same dose     - TSH  - T4, free    3. Mixed hyperlipidemia  - lipids are normal   - continue current dose Pitavastatin     - Comprehensive metabolic panel  - Lipid panel   - Lipid panel; Future     4. Hypertension   - well controlled       - continue  Losartan-HCTZ 100-25 mg daily, metoprolol XL 50 mg daily     RTC in 12 weeks     Bynum Bellows MSN FNP-BC  [facility]

## 2020-12-04 ENCOUNTER — Other Ambulatory Visit (INDEPENDENT_AMBULATORY_CARE_PROVIDER_SITE_OTHER): Payer: Self-pay | Admitting: Physician Assistant

## 2020-12-04 DIAGNOSIS — E876 Hypokalemia: Secondary | ICD-10-CM

## 2020-12-04 MED ORDER — KLOR-CON M10 10 MEQ PO TBCR
10.0000 meq | EXTENDED_RELEASE_TABLET | Freq: Every day | ORAL | 0 refills | Status: DC
Start: 2020-12-04 — End: 2021-03-04

## 2020-12-04 NOTE — Telephone Encounter (Signed)
Patient given 90 day supply with one refill 08/2020  Patient should have one refill still pending at pharmacy that is requesting     Will send another 90 day supply to ensure she does not go without

## 2020-12-12 ENCOUNTER — Other Ambulatory Visit (INDEPENDENT_AMBULATORY_CARE_PROVIDER_SITE_OTHER): Payer: Self-pay | Admitting: Physician Assistant

## 2020-12-12 NOTE — Telephone Encounter (Signed)
Patient due for annual exam as of 09/15/20  Will send temp refill.    Please have pt schedule annual exam     Thank you

## 2020-12-16 ENCOUNTER — Telehealth: Payer: Self-pay | Admitting: "Endocrinology

## 2020-12-16 NOTE — Telephone Encounter (Signed)
Spoke to patient regarding elevated blood sugars in the 200's      12/16/20     214 7:47 AM fasting    12/15/20     214 9:47 AM     12/14/20    213 9:11 AM

## 2020-12-17 NOTE — Telephone Encounter (Signed)
We stopped Claudia Adams because her A1C was normal and there was concern for hypoglycemia, however, given elevated fasting sugars, recommend restarting Tresiba at a lower dose 10 units HS.  If fasting sugars are still over 150s in the next 3 days, recommend increasing to 13 units HS.  Thanks

## 2020-12-17 NOTE — Telephone Encounter (Signed)
Reviewed recommendations patient verbalized understanding.

## 2020-12-29 ENCOUNTER — Other Ambulatory Visit (INDEPENDENT_AMBULATORY_CARE_PROVIDER_SITE_OTHER): Payer: Self-pay | Admitting: "Endocrinology

## 2020-12-29 DIAGNOSIS — E039 Hypothyroidism, unspecified: Secondary | ICD-10-CM

## 2021-01-24 ENCOUNTER — Other Ambulatory Visit (INDEPENDENT_AMBULATORY_CARE_PROVIDER_SITE_OTHER): Payer: Self-pay | Admitting: Physician Assistant

## 2021-01-24 DIAGNOSIS — G4700 Insomnia, unspecified: Secondary | ICD-10-CM

## 2021-01-24 DIAGNOSIS — I10 Essential (primary) hypertension: Secondary | ICD-10-CM

## 2021-01-26 NOTE — Telephone Encounter (Signed)
Please give a call to schedule follow-up for medication. I sent in a temporary supply to the pharmacy. Thank you.

## 2021-01-27 ENCOUNTER — Other Ambulatory Visit (INDEPENDENT_AMBULATORY_CARE_PROVIDER_SITE_OTHER): Payer: Self-pay | Admitting: Physician Assistant

## 2021-01-27 DIAGNOSIS — I10 Essential (primary) hypertension: Secondary | ICD-10-CM

## 2021-01-27 NOTE — Telephone Encounter (Signed)
Called pt, they will call back when they have their schedule with them.

## 2021-01-27 NOTE — Telephone Encounter (Signed)
Please give a call to schedule follow-up for medication next month. I sent in a temporary supply to the pharmacy. Thank you.

## 2021-02-10 ENCOUNTER — Ambulatory Visit (INDEPENDENT_AMBULATORY_CARE_PROVIDER_SITE_OTHER): Payer: Medicare Other | Admitting: Physician Assistant

## 2021-02-10 ENCOUNTER — Encounter (INDEPENDENT_AMBULATORY_CARE_PROVIDER_SITE_OTHER): Payer: Self-pay | Admitting: Physician Assistant

## 2021-02-10 VITALS — BP 124/80 | HR 78 | Temp 98.6°F | Wt 174.0 lb

## 2021-02-10 DIAGNOSIS — I10 Essential (primary) hypertension: Secondary | ICD-10-CM

## 2021-02-10 DIAGNOSIS — F32A Depression, unspecified: Secondary | ICD-10-CM

## 2021-02-10 DIAGNOSIS — Z794 Long term (current) use of insulin: Secondary | ICD-10-CM

## 2021-02-10 DIAGNOSIS — G4700 Insomnia, unspecified: Secondary | ICD-10-CM

## 2021-02-10 DIAGNOSIS — F419 Anxiety disorder, unspecified: Secondary | ICD-10-CM

## 2021-02-10 DIAGNOSIS — E782 Mixed hyperlipidemia: Secondary | ICD-10-CM

## 2021-02-10 DIAGNOSIS — E1142 Type 2 diabetes mellitus with diabetic polyneuropathy: Secondary | ICD-10-CM

## 2021-02-10 DIAGNOSIS — M069 Rheumatoid arthritis, unspecified: Secondary | ICD-10-CM

## 2021-02-10 DIAGNOSIS — E039 Hypothyroidism, unspecified: Secondary | ICD-10-CM

## 2021-02-10 MED ORDER — GABAPENTIN 300 MG PO CAPS
ORAL_CAPSULE | ORAL | 1 refills | Status: DC
Start: 2021-02-10 — End: 2021-08-13

## 2021-02-10 NOTE — Progress Notes (Signed)
[facility] - [facility] [place] [facility]                       Date of Exam: 02/10/2021 4:26 PM        Patient ID: Claudia Adams is a 75 y.o. female.  Attending Physician: Larna Daughters, PA        Chief Complaint:    Chief Complaint   Patient presents with    Diabetes    Hypertension    Hypothyroidism    Anxiety               HPI:    1. TYPE II DIABETES MELLITUS/HLD  - Now followed by endocrinology for this  - On Tresiba 14 U daily and Ozempic 1 mg once per week  - Has had the pneumonia vaccine and COVID vaccine   - Has neuropathy in feet for which she takes gabapentin 300 mg TID  - Taking Livalo 1 mg daily for HLD    2. HYPOTHYROIDISM:   - Now followed by endocrinology  - Taking Synthroid 25 mcg daily   - Denies palpitations, cold/heat intolerance, constipation    3. HYPERTENSION  - Taking Losartan-HCTZ 100-25 mg and Toprol XL 50 mg daily   - Has not been monitoring her BP at home regularly   - Denies chest pain, palpitations, lower extremity edema or SOB  - Tolerating her medication well  - No history of heart disease  - Had a nuclear stress test and carotid doppler in the past which were normal  - Still has plans to follow-up with a cardiologist to establish care    4. RHEUMATOID ARTHRITIS  - First diagnosed about 15 years ago   - Followed now by rheumatology in our area  - Currently on Remicade infusions every 4 weeks  - Also uses diclofenac PRN, hydroxychloroquine daily, methotrexate (six tablets) weekly   - Tramadol is PRN for her pain  - S/p right total knee replacement in 2021  - In PT now and states she's doing well     5. ANXIETY  - Has stopped taking Lexapro 20 mg daily and states overall she's feeling well   - Using Trazodone 50 mg nightly as needed  - Also using an OTC gummy vitamin with good relief (doesn't recall which)  - Using Ativan 1 mg PRN and states her panic attacks have recently been better controlled  - Lost her husband a few years ago   - Recently  relocated to be closer to family             Problem List:    Patient Active Problem List   Diagnosis    Seasonal allergic rhinitis    Rheumatoid arthritis    Type 2 diabetes mellitus with diabetic polyneuropathy, with long-term current use of insulin    Essential hypertension    Hyperlipidemia, mixed    Hypothyroidism, unspecified type    Anxiety and depression             Current Meds:    Outpatient Medications Marked as Taking for the 02/10/21 encounter (Office Visit) with Joelys Staubs S, PA   Medication Sig Dispense Refill    Accu-Chek SmartView test strip Monitor blood glucose once daily, Dx: E11.42 100 each 11    BD Pen Needle Nano 2nd Gen 32G X 4 MM Misc Use once daily as directed. 100 each 1    fluticasone (FLONASE) 50 MCG/ACT nasal spray 1  spray by Nasal route as needed for Allergies 18 mL 2    folic acid (FOLVITE) 1 MG tablet TAKE 1 TABLET BY MOUTH EVERY DAY 90 tablet 0    hydroxychloroquine (PLAQUENIL) 200 MG tablet Take 200 mg by mouth daily         inFLIXimab (REMICADE IV) once every six weeks         Insulin Degludec Evaristo Bury FlexTouch) 100 UNIT/ML Solution Pen-injector Inject 20 Units into the skin daily 15 mL 1    KLOR-CON M 10 MEQ tablet Take 1 tablet (10 mEq total) by mouth daily 90 tablet 0    levothyroxine (SYNTHROID) 25 MCG tablet TAKE 1 TABLET(25 MCG) BY MOUTH EVERY DAY AT 6 AM 90 tablet 1    losartan-hydrochlorothiazide (HYZAAR) 100-25 MG per tablet TAKE 1 TABLET BY MOUTH DAILY 90 tablet 1    methotrexate 2.5 MG tablet Takes 6 tablets once a week        metoprolol succinate XL (TOPROL-XL) 50 MG 24 hr tablet TAKE 1 TABLET BY MOUTH EVERY DAY 90 tablet 1    Multiple Vitamins-Minerals (Womens 50+ Multi Vitamin/Min) Tab Take 1 tablet by mouth daily      Pitavastatin Magnesium 1 MG Tab Take by mouth      Semaglutide, 1 MG/DOSE, (Ozempic, 1 MG/DOSE,) 4 MG/3ML Solution Pen-injector Inject 1 mg into the skin once a week Once a week 9 mL 1    traMADol (ULTRAM) 50 MG tablet Take 50 mg  by mouth every 6 (six) hours as needed for Pain      traZODone (DESYREL) 50 MG tablet TAKE 1 TABLET(50 MG) BY MOUTH EVERY NIGHT AS NEEDED FOR SLEEP 30 tablet 5    [DISCONTINUED] gabapentin (NEURONTIN) 300 MG capsule TAKE 1 CAPSULE BY MOUTH THREE TIMES A DAY 270 capsule 1          Allergies:    Allergies   Allergen Reactions    Penicillin G      Reports allergic as a child               Past Surgical History:    Past Surgical History:   Procedure Laterality Date    KNEE SURGERY Right 11/30/2019    knee replacement by Dr. Harlene Salts    SHOULDER SURGERY Right 2019    Rotator cuff           Family History:    Family History   Problem Relation Age of Onset    No known problems Mother     No known problems Father            Social History:    Social History     Tobacco Use    Smoking status: Never Smoker    Smokeless tobacco: Never Used   Haematologist Use: Never used   Substance Use Topics    Alcohol use: Not Currently    Drug use: Never           The following sections were reviewed this encounter by the provider:   Tobacco   Allergies   Meds   Problems   Med Hx   Surg Hx   Fam Hx                Vital Signs:    BP 124/80 (BP Site: Left arm)    Pulse 78    Temp 98.6 F (37 C)    Wt 78.9 kg (174 lb)  SpO2 96%    BMI 32.88 kg/m          ROS:    Review of Systems   Constitutional: Negative for activity change, appetite change and chills.   Respiratory: Negative for cough, shortness of breath and wheezing.    Cardiovascular: Negative for chest pain and palpitations.   Gastrointestinal: Negative for abdominal pain, constipation and diarrhea.   Genitourinary: Negative for dysuria, flank pain and frequency.   Musculoskeletal: Negative for back pain, myalgias and neck pain.   Skin: Negative for rash.   Neurological: Negative for dizziness, weakness and numbness.              Physical Exam:    Physical Exam  Constitutional:       Appearance: Normal appearance.   HENT:      Head: Normocephalic and  atraumatic.   Cardiovascular:      Rate and Rhythm: Normal rate and regular rhythm.      Pulses: Normal pulses.      Heart sounds: Normal heart sounds.   Pulmonary:      Effort: Pulmonary effort is normal. No respiratory distress.      Breath sounds: Normal breath sounds.   Abdominal:      General: Abdomen is flat. Bowel sounds are normal.      Palpations: Abdomen is soft.   Musculoskeletal:      Cervical back: Normal range of motion and neck supple.   Neurological:      General: No focal deficit present.      Mental Status: She is alert.   Psychiatric:         Mood and Affect: Mood normal.         Behavior: Behavior normal.         Thought Content: Thought content normal.              Assessment/Plan:    1. Essential hypertension  - BP well controlled at this time  - Plan to continue with the present medication/dose  - Discussed signs/symptoms and possible etiologies including lifestyle and genetic factors  - Recommend monitoring BP at home and working on a low salt diet  - Reviewed labs from her recent visit with endocrinology     2. Type 2 diabetes mellitus with diabetic polyneuropathy, with long-term current use of insulin  - gabapentin (NEURONTIN) 300 MG capsule; TAKE 1 CAPSULE BY MOUTH THREE TIMES A DAY  Dispense: 270 capsule; Refill: 1  3. Hyperlipidemia, mixed  4. Hypothyroidism, unspecified type  - Followed by endocrinology for this    5. Rheumatoid arthritis involving multiple sites, unspecified whether rheumatoid factor present  - Followed by rheumatology for this    6. Anxiety and depression  7. Insomnia, unspecified type  - Doing well at this time off of medication  - Plan to continue with the present plan and follow-up if symptoms worsen                  Follow-up:    Return in about 6 months (around 08/12/2021) for Annual wellness exam, Medication follow-up.         Prince Olivier S Terril Chestnut, PA

## 2021-02-17 ENCOUNTER — Ambulatory Visit (INDEPENDENT_AMBULATORY_CARE_PROVIDER_SITE_OTHER): Payer: Medicare Other | Admitting: "Endocrinology

## 2021-02-18 ENCOUNTER — Encounter (INDEPENDENT_AMBULATORY_CARE_PROVIDER_SITE_OTHER): Payer: Self-pay | Admitting: Physician Assistant

## 2021-03-04 ENCOUNTER — Other Ambulatory Visit (INDEPENDENT_AMBULATORY_CARE_PROVIDER_SITE_OTHER): Payer: Self-pay | Admitting: Physician Assistant

## 2021-03-04 DIAGNOSIS — E876 Hypokalemia: Secondary | ICD-10-CM

## 2021-03-11 ENCOUNTER — Other Ambulatory Visit (INDEPENDENT_AMBULATORY_CARE_PROVIDER_SITE_OTHER): Payer: Self-pay | Admitting: Physician Assistant

## 2021-03-20 ENCOUNTER — Other Ambulatory Visit (INDEPENDENT_AMBULATORY_CARE_PROVIDER_SITE_OTHER): Payer: Self-pay | Admitting: Physician Assistant

## 2021-03-26 ENCOUNTER — Ambulatory Visit (INDEPENDENT_AMBULATORY_CARE_PROVIDER_SITE_OTHER): Payer: Medicare Other | Admitting: "Endocrinology

## 2021-03-26 ENCOUNTER — Encounter (INDEPENDENT_AMBULATORY_CARE_PROVIDER_SITE_OTHER): Payer: Self-pay | Admitting: "Endocrinology

## 2021-03-26 VITALS — BP 125/69 | HR 70 | Temp 98.1°F | Resp 12 | Ht 61.0 in | Wt 176.0 lb

## 2021-03-26 DIAGNOSIS — E039 Hypothyroidism, unspecified: Secondary | ICD-10-CM

## 2021-03-26 DIAGNOSIS — Z794 Long term (current) use of insulin: Secondary | ICD-10-CM

## 2021-03-26 DIAGNOSIS — E782 Mixed hyperlipidemia: Secondary | ICD-10-CM

## 2021-03-26 DIAGNOSIS — E1142 Type 2 diabetes mellitus with diabetic polyneuropathy: Secondary | ICD-10-CM

## 2021-03-26 LAB — COMPREHENSIVE METABOLIC PANEL
ALT: 16 U/L (ref 0–55)
AST (SGOT): 21 U/L (ref 5–34)
Albumin/Globulin Ratio: 1.4 (ref 0.9–2.2)
Albumin: 3.7 g/dL (ref 3.5–5.0)
Alkaline Phosphatase: 70 U/L (ref 37–117)
Anion Gap: 10 (ref 5.0–15.0)
BUN: 15 mg/dL (ref 7.0–19.0)
Bilirubin, Total: 0.5 mg/dL (ref 0.2–1.2)
CO2: 28 mEq/L (ref 21–29)
Calcium: 9.2 mg/dL (ref 7.9–10.2)
Chloride: 104 mEq/L (ref 100–111)
Creatinine: 1.2 mg/dL (ref 0.4–1.5)
Globulin: 2.7 g/dL (ref 2.0–3.6)
Glucose: 240 mg/dL — ABNORMAL HIGH (ref 70–100)
Potassium: 4.2 mEq/L (ref 3.5–5.1)
Protein, Total: 6.4 g/dL (ref 6.0–8.3)
Sodium: 142 mEq/L (ref 136–145)

## 2021-03-26 LAB — LIPID PANEL
Cholesterol / HDL Ratio: 2.8 Index
Cholesterol: 187 mg/dL (ref 0–199)
HDL: 68 mg/dL (ref 40–9999)
LDL Calculated: 99 mg/dL (ref 0–99)
Triglycerides: 101 mg/dL (ref 34–149)
VLDL Calculated: 20 mg/dL (ref 10–40)

## 2021-03-26 LAB — MICROALBUMIN, RANDOM URINE
Urine Creatinine, Random: 68.4 mg/dL
Urine Microalbumin, Random: <5 ug/ml (ref 0.0–30.0)

## 2021-03-26 LAB — HEMOGLOBIN A1C
Average Estimated Glucose: 139.9 mg/dL
Hemoglobin A1C: 6.5 % — ABNORMAL HIGH (ref 4.6–5.9)

## 2021-03-26 LAB — GFR: EGFR: 53

## 2021-03-26 LAB — HEMOLYSIS INDEX: Hemolysis Index: 11 Index (ref 0–24)

## 2021-03-26 MED ORDER — SEMAGLUTIDE (2 MG/DOSE) 8 MG/3ML SC SOPN
2.0000 mg | PEN_INJECTOR | SUBCUTANEOUS | 1 refills | Status: DC
Start: 2021-03-26 — End: 2021-09-30

## 2021-03-26 NOTE — Progress Notes (Signed)
Subjective:      Date: 03/26/2021 12:50 PM   Patient ID: Claudia Adams is a 75 y.o. female.    Chief Complaint:  Chief Complaint   Patient presents with    Type 2 diabetes mellitus with diabetic polyneuropathy, with         HPI  Visit type:  follow up   Type: Type II and insulin requiring  Evaluation:  diagnosed 20 years ago   Last follow-up:  11/25/20    Diabetes Medication Regimen: Ozempic 1 mg weekly , Tresiba 14 units every evening   Medication effectiveness/adherence: working well and general adherence with medications  Medication side effects: none  Presenting symptoms at time of diagnosis: abnormal screening glucose     Prior medications: Metformin d/c due to previously low  kidney function.     Interval events:   Joined [facility] [employer] in [place] and will start various types of exercise  Has RA and recently was told by her Rheumatologist that she also has osteoarthritis   She continues on Remicade    Was discharged from [facility] due to stable kidney function     Current symptoms: tingling/numbness in extremities - not worse from prior and Nuerontin is helping   Patient denies: no other symptoms including chest pain, shortness of breath, hypoglycemic episodes, polydipsia, polyuria, headaches, dizziness, or abdominal pain  Exercise: walking 3x daily    Diet: moderate compliance with recommended diet   Complications: diabetic peripheral neuropathy  Pertinent medical history: hyperlipidemia, hypertension, hypothyroidism   Home glucose readings: Recall fasting  : 113-130 occasionally 150s's   A1c result: 6.5 (03/26/2021)  A1c trend is increasing and at goal  Microalbumin trend: No results found for: MICROALBUMIN  Microalbumin trend is negative  LDL trend:   Lab Results   Component Value Date    LDL 99 03/26/2021    LDL 96 11/25/2020    LDL 85 08/12/2020     LDL trend is at goal  Additional concerns: Pt has stable essential HTN with no evidence of CHF or proteinuria.  The patient is compliant with anti-hypertensive  therapy and denies side effects to therapy.  Pt denies CP, SOB, dizziness, orthopnea, PND or edema.  Pt has hyperlipidemia and is compliant with lipid therapy.  Pt denies side effects of lipid therapy - specifically denies myalgia.  Pt has hypothyroidism - compliant with current regimen.  Denies side effects to therapy.  Denies any change in hair/nails/skin, constipation, unexplained wt changes.    Current thyroid hormone levels are normal  Clinically stable  On levothyroxine 25 mcg daily and reports good compliance with therapy.    Saw retina specialist in May, did not note retinopathy     Problem List:  Patient Active Problem List   Diagnosis    Seasonal allergic rhinitis    Rheumatoid arthritis    Type 2 diabetes mellitus with diabetic polyneuropathy, with long-term current use of insulin    Essential hypertension    Hyperlipidemia, mixed    Hypothyroidism, unspecified type    Anxiety and depression       Current Medications:  Current Outpatient Medications   Medication Sig Dispense Refill    Accu-Chek SmartView test strip MONITER BLOOD GLUCOSE DAILY 100 strip 3    BD Pen Needle Nano 2nd Gen 32G X 4 MM Misc Use once daily as directed. 100 each 1    escitalopram (LEXAPRO) 20 MG tablet Take 20 mg by mouth daily      fluticasone (FLONASE) 50 MCG/ACT  nasal spray 1 spray by Nasal route as needed for Allergies 18 mL 2    folic acid (FOLVITE) 1 MG tablet TAKE 1 TABLET BY MOUTH EVERY DAY 90 tablet 0    gabapentin (NEURONTIN) 300 MG capsule TAKE 1 CAPSULE BY MOUTH THREE TIMES A DAY 270 capsule 1    hydroxychloroquine (PLAQUENIL) 200 MG tablet Take 200 mg by mouth daily         inFLIXimab (REMICADE IV) once every six weeks         Insulin Degludec Evaristo Bury FlexTouch) 100 UNIT/ML Solution Pen-injector Inject 20 Units into the skin daily 15 mL 1    levothyroxine (SYNTHROID) 25 MCG tablet TAKE 1 TABLET(25 MCG) BY MOUTH EVERY DAY AT 6 AM 90 tablet 1    losartan-hydrochlorothiazide (HYZAAR) 100-25 MG per tablet TAKE 1 TABLET BY  MOUTH DAILY 90 tablet 1    methotrexate 2.5 MG tablet Takes 6 tablets once a week        metoprolol succinate XL (TOPROL-XL) 50 MG 24 hr tablet TAKE 1 TABLET BY MOUTH EVERY DAY 90 tablet 1    Multiple Vitamins-Minerals (Womens 50+ Multi Vitamin/Min) Tab Take 1 tablet by mouth daily      Pitavastatin Magnesium 1 MG Tab Take by mouth      potassium chloride (KLOR-CON) 10 MEQ tablet TAKE 1 TABLET(10 MEQ) BY MOUTH DAILY 90 tablet 1    Semaglutide, 1 MG/DOSE, (Ozempic, 1 MG/DOSE,) 4 MG/3ML Solution Pen-injector Inject 1 mg into the skin once a week Once a week 9 mL 1    traMADol (ULTRAM) 50 MG tablet Take 50 mg by mouth every 6 (six) hours as needed for Pain      traZODone (DESYREL) 50 MG tablet TAKE 1 TABLET(50 MG) BY MOUTH EVERY NIGHT AS NEEDED FOR SLEEP 30 tablet 5     No current facility-administered medications for this visit.       Allergies:  Allergies   Allergen Reactions    Penicillin G      Reports allergic as a child         Past Medical History:  Past Medical History:   Diagnosis Date    Anxiety     Diabetes mellitus     Disorder of thyroid     Hypertension     Rheumatoid arthritis     Seasonal allergic rhinitis        Past Surgical History:  Past Surgical History:   Procedure Laterality Date    KNEE SURGERY Right 11/30/2019    knee replacement by Dr. Harlene Salts    SHOULDER SURGERY Right 2019    Rotator cuff       Family History:  Family History   Problem Relation Age of Onset    No known problems Mother     No known problems Father        Social History:  Social History     Socioeconomic History    Marital status: Widowed   Tobacco Use    Smoking status: Never    Smokeless tobacco: Never   Vaping Use    Vaping Use: Never used   Substance and Sexual Activity    Alcohol use: Not Currently    Drug use: Never        The following sections were reviewed this encounter by the provider:   Tobacco  Allergies  Meds           Vitals:  BP 125/69 (BP Site: Left arm, Patient Position: Sitting,  Cuff Size: Large)    Pulse 70   Temp 98.1 F (36.7 C) (Temporal)   Resp 12   Ht 1.549 m (5\' 1" )   Wt 79.8 kg (176 lb)   SpO2 99%   BMI 33.25 kg/m      ROS: A complete 12 point ROS was obtained. Pertinent positives and negatives as noted in HPI. All other systems are negative.    Physical Examination     General appearance: alert, appears stated age, cooperative and no distress  Eyes: conjunctivae/corneas clear. PERRL, EOM's intact.   Neck: no cervical lymphadenopathy, no carotid bruit,  supple, symmetrical, trachea midline   Thyroid:normal sized on inspection; non tender to palpation and no discrete nodules palpated  Lungs: clear to auscultation bilaterally  Heart: regular rate and rhythm, S1, S2 normal, no murmur, click, rub or gallop  Abdomen: soft, non-tender; bowel sounds normal; no masses,  no organomegaly  Extremities: extremities normal, atraumatic, no cyanosis or edema  Skin: Skin color, texture, turgor normal. No rashes or lesions  Neurologic: Alert and oriented X 3, normal strength and tone. Normal symmetric reflexes. No tremor    Assessment and Plan     1. Type 2 diabetes mellitus with diabetic polyneuropathy, with long-term current use of insulin  -   Lab Results   Component Value Date    HGBA1C 6.5 (H) 03/26/2021    HGBA1C 5.8 11/25/2020    HGBA1C 5.6 08/12/2020       Current  A1C is at goal (goal A1C <6.5%). A!C trend has increased compared to prior values.  -    Increase Ozempic to 2 mg weekly  - Stop Evaristo Bury when starting increased dose Ozempic  - Recommend home glucometer testing once a day (pre-breakfast or 2 hrs post-prandial).  - Recommend therapeutic lifestyle changes which include optimizing low carbohydrate diet and aerobic exercise efforts.    - Semaglutide, 2 MG/DOSE, 8 MG/3ML Solution Pen-injector; Inject 2 mg into the skin once a week  Dispense: 9 mL; Refill: 1  - Urine Microalbumin Random  - Hemoglobin A1C  - Comprehensive metabolic panel  - Lipid panel  - TSH    2. Hypothyroidism, unspecified  type  -Clinically and biochemically euthyroid on current dose levothyroxine 25 mcg daily    - TSH  - T4, free    3. Mixed hyperlipidemia  -Lipids are normal  - Continue Pitavastatin magnesium 1 mg daily    - Comprehensive metabolic panel  - Lipid panel     4. Hypertension   - well controlled     - continue  Losartan-HCTZ 100-25 mg daily, metoprolol XL 50 mg daily     RTC in 12 weeks     Bynum Bellows MSN FNP-BC  [facility]

## 2021-03-27 ENCOUNTER — Telehealth: Payer: Self-pay | Admitting: "Endocrinology

## 2021-03-27 ENCOUNTER — Other Ambulatory Visit (INDEPENDENT_AMBULATORY_CARE_PROVIDER_SITE_OTHER): Payer: Self-pay | Admitting: "Endocrinology

## 2021-03-27 DIAGNOSIS — E039 Hypothyroidism, unspecified: Secondary | ICD-10-CM

## 2021-03-27 LAB — TSH: TSH: 1.08 u[IU]/mL (ref 0.35–4.94)

## 2021-03-27 LAB — T4, FREE: T4 Free: 0.83 ng/dL (ref 0.70–1.48)

## 2021-03-27 MED ORDER — LEVOTHYROXINE SODIUM 25 MCG PO TABS
25.0000 ug | ORAL_TABLET | Freq: Every day | ORAL | 1 refills | Status: DC
Start: 2021-03-27 — End: 2021-09-28

## 2021-03-27 NOTE — Progress Notes (Signed)
1) A1C is 6.5 has increased from 5.8 but still in the range of well controlled diabetes.  Please follow recommendations discussed at recent visit (Ozempic increased to 2 mg weekly and stop tresiba)   2) thyroid hormone levels are normal, pls continue same dose Levothyroxine   3) urine microalbumin is normal, kidney function is reasonable for patient's age, ensure adequate water hydration at least 64 oz daily   4) lipid panel is normal

## 2021-03-27 NOTE — Patient Instructions (Signed)
1.  Increase Ozempic to 2 mg subcutaneous injection once weekly.    2.  Stop Evaristo Bury when you start increased dose of Ozempic.

## 2021-03-27 NOTE — Telephone Encounter (Signed)
Patient is calling requesting a call back to discuss her labs results.    Call back # [phone]

## 2021-03-30 NOTE — Telephone Encounter (Signed)
Reviewed recommendations with patient verbalized understanding with no further questions.

## 2021-05-04 ENCOUNTER — Other Ambulatory Visit (INDEPENDENT_AMBULATORY_CARE_PROVIDER_SITE_OTHER): Payer: Self-pay | Admitting: Physician Assistant

## 2021-06-06 ENCOUNTER — Other Ambulatory Visit (INDEPENDENT_AMBULATORY_CARE_PROVIDER_SITE_OTHER): Payer: Self-pay | Admitting: Physician Assistant

## 2021-07-27 ENCOUNTER — Encounter (INDEPENDENT_AMBULATORY_CARE_PROVIDER_SITE_OTHER): Payer: Self-pay | Admitting: "Endocrinology

## 2021-07-27 ENCOUNTER — Ambulatory Visit (INDEPENDENT_AMBULATORY_CARE_PROVIDER_SITE_OTHER): Payer: Medicare Other | Admitting: "Endocrinology

## 2021-07-27 VITALS — BP 132/71 | HR 76 | Temp 98.1°F | Resp 12 | Ht 61.0 in | Wt 159.8 lb

## 2021-07-27 DIAGNOSIS — Z794 Long term (current) use of insulin: Secondary | ICD-10-CM

## 2021-07-27 DIAGNOSIS — E1142 Type 2 diabetes mellitus with diabetic polyneuropathy: Secondary | ICD-10-CM

## 2021-07-27 DIAGNOSIS — E782 Mixed hyperlipidemia: Secondary | ICD-10-CM

## 2021-07-27 DIAGNOSIS — E039 Hypothyroidism, unspecified: Secondary | ICD-10-CM

## 2021-07-27 LAB — HEMOGLOBIN A1C
Average Estimated Glucose: 119.8 mg/dL
Hemoglobin A1C: 5.8 % (ref 4.6–5.9)

## 2021-07-27 LAB — LIPID PANEL
Cholesterol / HDL Ratio: 2.6 Index
Cholesterol: 180 mg/dL (ref 0–199)
HDL: 70 mg/dL (ref 40–9999)
LDL Calculated: 92 mg/dL (ref 0–99)
Triglycerides: 92 mg/dL (ref 34–149)
VLDL Calculated: 18 mg/dL (ref 10–40)

## 2021-07-27 LAB — COMPREHENSIVE METABOLIC PANEL
ALT: 23 U/L (ref 0–55)
AST (SGOT): 27 U/L (ref 5–41)
Albumin/Globulin Ratio: 1.4 (ref 0.9–2.2)
Albumin: 4 g/dL (ref 3.5–5.0)
Alkaline Phosphatase: 66 U/L (ref 37–117)
Anion Gap: 8 (ref 5.0–15.0)
BUN: 12 mg/dL (ref 7.0–21.0)
Bilirubin, Total: 0.8 mg/dL (ref 0.2–1.2)
CO2: 29 mEq/L (ref 17–29)
Calcium: 9.5 mg/dL (ref 7.9–10.2)
Chloride: 105 mEq/L (ref 99–111)
Creatinine: 1.2 mg/dL — ABNORMAL HIGH (ref 0.4–1.0)
Globulin: 2.8 g/dL (ref 2.0–3.6)
Glucose: 155 mg/dL — ABNORMAL HIGH (ref 70–100)
Potassium: 3.8 mEq/L (ref 3.5–5.3)
Protein, Total: 6.8 g/dL (ref 6.0–8.3)
Sodium: 142 mEq/L (ref 135–145)

## 2021-07-27 LAB — MICROALBUMIN, RANDOM URINE
Urine Creatinine, Random: 122.6 mg/dL
Urine Microalbumin, Random: <5 ug/ml (ref 0.0–30.0)

## 2021-07-27 LAB — HEMOLYSIS INDEX: Hemolysis Index: 9 Index (ref 0–24)

## 2021-07-27 LAB — GFR: EGFR: 53

## 2021-07-27 NOTE — Progress Notes (Signed)
Subjective:      Date: 07/27/2021 1:52 PM   Patient ID: Claudia Adams is a 75 y.o. female.    Chief Complaint:  Chief Complaint   Patient presents with    Type 2 diabetes mellitus with diabetic polyneuropathy, with         HPI  Visit type:  follow up   Type: Type II and insulin requiring  Evaluation:  diagnosed 20 years ago   Last follow-up:  03/26/21    Diabetes Medication Regimen: Ozempic 2 mg weekly   Medication effectiveness/adherence: working well and general adherence with medications  Medication side effects: none  Presenting symptoms at time of diagnosis: abnormal screening glucose     Prior medications: Metformin d/c due to previously low  kidney function; [place].     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   Has RA and OA  She continues on Remicade and dose increased recently.   Has chronic fatigue and joint pain due to RA and OA     Was discharged from Nephro due to stable kidney function     Current symptoms: tingling/numbness in extremities - not worse from prior and Nuerontin is helping   Patient denies: no other symptoms including chest pain, shortness of breath, hypoglycemic episodes, polydipsia, polyuria, headaches, dizziness, or abdominal pain  Exercise: walking 3x daily, short walks    Diet: moderate compliance with recommended diet; has cut out most refined sugar   Complications: diabetic peripheral neuropathy  Pertinent medical history: hyperlipidemia, hypertension, hypothyroidism   Home glucose readings: Download glucose meter :  14 days fasting average glucose: 148   Highest glucose reading: 168   Lowest glucose reading:  130   Median glucose: 151  A1c result: 6.5 (03/26/2021)  A1c trend is increasing and at goal  Microalbumin trend: No results found for: MICROALBUMIN  Microalbumin trend is negative  LDL trend:   Lab Results   Component Value Date    LDL 99 03/26/2021    LDL 96 11/25/2020    LDL 85  08/12/2020     LDL trend is at goal  Additional concerns: Pt has stable essential HTN with no evidence of CHF or proteinuria.  The patient is compliant with anti-hypertensive therapy and denies side effects to therapy.  Pt denies CP, SOB, dizziness, orthopnea, PND or edema.  Pt has hyperlipidemia and is compliant with lipid therapy.  Pt denies side effects of lipid therapy - specifically denies myalgia.  Pt has hypothyroidism - compliant with current regimen.  Denies side effects to therapy.  Denies any change in hair/nails/skin, constipation, unexplained wt changes.    Current thyroid hormone levels are normal  Clinically stable  On levothyroxine 25 mcg daily and reports good compliance with therapy.    Obesity BMI 30.19   Weight (03/26/21):  176 lbs  Weight (07/27/21) 159 lbs     Saw retina specialist in May, did not note retinopathy     Problem List:  Patient Active Problem List   Diagnosis    Seasonal allergic rhinitis    Rheumatoid arthritis    Type 2 diabetes mellitus with diabetic polyneuropathy, with long-term current use of insulin    Essential hypertension    Hyperlipidemia, mixed    Hypothyroidism, unspecified type    Anxiety and depression       Current Medications:  Current Outpatient Medications   Medication Sig Dispense Refill  Accu-Chek SmartView test strip MONITER BLOOD GLUCOSE DAILY 100 strip 3    BD Pen Needle Nano 2nd Gen 32G X 4 MM Misc Use once daily as directed. 100 each 1    escitalopram (LEXAPRO) 20 MG tablet Take 20 mg by mouth daily      fluticasone (FLONASE) 50 MCG/ACT nasal spray 1 spray by Nasal route as needed for Allergies 18 mL 2    folic acid (FOLVITE) 1 MG tablet TAKE 1 TABLET BY MOUTH EVERY DAY 90 tablet 0    gabapentin (NEURONTIN) 300 MG capsule TAKE 1 CAPSULE BY MOUTH THREE TIMES A DAY 270 capsule 1    hydroxychloroquine (PLAQUENIL) 200 MG tablet Take 200 mg by mouth daily         inFLIXimab (REMICADE IV) every 28 days      levothyroxine (SYNTHROID) 25 MCG tablet Take 1 tablet  (25 mcg total) by mouth Once a day at 6:00am 90 tablet 1    losartan-hydrochlorothiazide (HYZAAR) 100-25 MG per tablet TAKE 1 TABLET BY MOUTH DAILY 90 tablet 1    metoprolol succinate XL (TOPROL-XL) 50 MG 24 hr tablet TAKE 1 TABLET BY MOUTH EVERY DAY 90 tablet 1    Multiple Vitamins-Minerals (Womens 50+ Multi Vitamin/Min) Tab Take 1 tablet by mouth daily      Pitavastatin Magnesium 1 MG Tab Take by mouth      potassium chloride (KLOR-CON) 10 MEQ tablet TAKE 1 TABLET(10 MEQ) BY MOUTH DAILY 90 tablet 1    Semaglutide, 2 MG/DOSE, 8 MG/3ML Solution Pen-injector Inject 2 mg into the skin once a week 9 mL 1    traMADol (ULTRAM) 50 MG tablet Take 50 mg by mouth every 6 (six) hours as needed for Pain       No current facility-administered medications for this visit.       Allergies:  Allergies   Allergen Reactions    Penicillin G      Reports allergic as a child         Past Medical History:  Past Medical History:   Diagnosis Date    Anxiety     Diabetes mellitus     Disorder of thyroid     Hypertension     Rheumatoid arthritis     Seasonal allergic rhinitis        Past Surgical History:  Past Surgical History:   Procedure Laterality Date    KNEE SURGERY Right 11/30/2019    knee replacement by Dr. Harlene Salts    SHOULDER SURGERY Right 2019    Rotator cuff       Family History:  Family History   Problem Relation Age of Onset    No known problems Mother     No known problems Father        Social History:  Social History     Socioeconomic History    Marital status: Widowed   Tobacco Use    Smoking status: Never    Smokeless tobacco: Never   Vaping Use    Vaping Use: Never used   Substance and Sexual Activity    Alcohol use: Not Currently    Drug use: Yes     Types: Marijuana     Comment: Gummie edibles for pain and anxiety        The following sections were reviewed this encounter by the provider:   Tobacco  Allergies  Meds         Vitals:  BP 132/71 (BP Site: Left arm, Patient  Position: Sitting, Cuff Size: Medium)    Pulse 76   Temp 98.1 F (36.7 C) (Temporal)   Resp 12   Ht 1.549 m (5\' 1" )   Wt 72.5 kg (159 lb 12.8 oz)   SpO2 99%   BMI 30.19 kg/m      ROS: A complete 12 point ROS was obtained. Pertinent positives and negatives as noted in HPI. All other systems are negative.    Physical Examination     General appearance: alert, appears stated age, cooperative and no distress  Eyes: conjunctivae/corneas clear. PERRL, EOM's intact.   Neck: no cervical lymphadenopathy, no carotid bruit,  supple, symmetrical, trachea midline   Thyroid:normal sized on inspection; non tender to palpation and no discrete nodules palpated  Lungs: clear to auscultation bilaterally  Heart: regular rate and rhythm, S1, S2 normal, no murmur, click, rub or gallop  Abdomen: soft, non-tender; bowel sounds normal; no masses,  no organomegaly  Extremities: extremities normal, atraumatic, no cyanosis or edema  Skin: Skin color, texture, turgor normal. No rashes or lesions  Neurologic: Alert and oriented X 3, normal strength and tone. Normal symmetric reflexes. No tremor    Assessment and Plan     1. Type 2 diabetes mellitus with diabetic polyneuropathy, with long-term current use of insulin  -   Lab Results   Component Value Date    HGBA1C 6.5 (H) 03/26/2021    HGBA1C 5.8 11/25/2020    HGBA1C 5.6 08/12/2020       Current A1C is at goal (goal A1C<7.0%).  - Continue current DM medication regimen.  - off Evaristo Bury following increase in Ozempic dose to 2 mg SC weekly  - discussed addition of slgt-2 I if A1C is over 7 %   - Recommend home glucometer testing twice a day (pre-breakfast or 2 hrs post-prandial).  - continue carb controlled diet and continue current physical activities     - Urine Microalbumin Random  - Hemoglobin A1C  - Comprehensive metabolic panel    2. Mixed hyperlipidemia  - LDL is slightly elevated above target goal < 70   - on Pitavastatin 1 mg daily   - plan to increase if LDL is still elevated above target goal     - Comprehensive  metabolic panel  - Lipid panel    3. Acquired hypothyroidism  - Patient denies marked or specific signs or symptoms suggesting either thyroid excess or deficiency.   - on Levothyroxine 25 mcg daily   -Most recent thyroid hormone levels are normal  -Recheck thyroid hormone levels and adjust dose if needed upon review of labs     - TSH  - T4, free     4. Hypertension   - well controlled     - continue  Losartan-HCTZ 100-25 mg daily, metoprolol XL 50 mg daily    5. Obesity BMI 30.19   - 17 lbs weight loss in 4 months   - continue calorie restricted diet and current exercise regimen   - on Ozempic, has added weight loss benefit     RTC in 12 weeks     Bynum Bellows MSN FNP-BC  [facility]

## 2021-07-28 LAB — T4, FREE: T4 Free: 0.96 ng/dL (ref 0.69–1.48)

## 2021-07-28 LAB — TSH: TSH: 1.03 u[IU]/mL (ref 0.35–4.94)

## 2021-07-28 NOTE — Progress Notes (Signed)
1. A1C is 5.8 (3 month ave BS 119) in the range of excellent diabetes control, has improved from 6.5, pls continue current diabetes regimen   2. Thyroid hormone levels are normal, pls continue current dose Levothyroxine 25 mcg daily   3. Lipid panel normal, pls continue current dose Pitavastatin   4. Kidney function is stable at 53 %, urine microalbumin is normal,  liver enzymes are normal

## 2021-07-29 ENCOUNTER — Other Ambulatory Visit (INDEPENDENT_AMBULATORY_CARE_PROVIDER_SITE_OTHER): Payer: Self-pay | Admitting: Physician Assistant

## 2021-07-29 ENCOUNTER — Encounter (INDEPENDENT_AMBULATORY_CARE_PROVIDER_SITE_OTHER): Payer: Self-pay

## 2021-07-29 DIAGNOSIS — I10 Essential (primary) hypertension: Secondary | ICD-10-CM

## 2021-07-31 ENCOUNTER — Other Ambulatory Visit (INDEPENDENT_AMBULATORY_CARE_PROVIDER_SITE_OTHER): Payer: Self-pay | Admitting: Physician Assistant

## 2021-07-31 DIAGNOSIS — I10 Essential (primary) hypertension: Secondary | ICD-10-CM

## 2021-07-31 NOTE — Telephone Encounter (Signed)
Please help pt schedule f/u for meds. I sent her temp refill of 30 days. Thank you

## 2021-08-05 NOTE — Progress Notes (Unsigned)
[facility] - [facility] [place] [facility]                       Date of Exam: 08/13/2021 4:04 PM        Patient ID: Claudia Adams is a 75 y.o. female.  Attending Physician: Gaetano Hawthorne, DNP FNP        Chief Complaint:    No chief complaint on file.              HPI:    Hypertension  Average BPs at home:  n/a   Medication compliance:  daily, as Rx'd, losartan-hctz, metoprolol succinate  Chest pain/pressure, shortness of breath?  no  Exercise/Diet:  no change  Last micro:  07/27/21  Refill needed:  yes    Lab Results       Component                Value               Date                       CREAT                    1.2 (H)             07/27/2021                 CREAT                    1.2                 03/26/2021                 EGFR                     53.0                07/27/2021                 EGFR                     53.0                03/26/2021               Depression/Anxiety:  Medication compliance:  daily, as Rx'd, lexapro 20mg   Response to medication:  no complaints  Crying spells:  no  Thoughts of self harm:  no  Issues with sleep:  no  Refill needed: yes            Problem List:    Patient Active Problem List   Diagnosis   . Seasonal allergic rhinitis   . Rheumatoid arthritis   . Diabetes mellitus   . Essential hypertension   . Hyperlipidemia   . Hypothyroidism   . Anxiety and depression   . Primary localized osteoarthrosis of ankle and foot   . Polyneuropathy due to type 2 diabetes mellitus   . Pain in right knee   . Closed fracture of proximal phalanx of middle finger   . Anxiety             Current Meds:    No outpatient medications have been marked as taking for the 08/13/21 encounter (Appointment) with Gaetano Hawthorne, DNP FNP.          Allergies:    Allergies  Allergen Reactions   . Penicillin G      Reports allergic as a child               Past Surgical History:    Past Surgical History:   Procedure Laterality Date   . KNEE SURGERY Right 11/30/2019    knee  replacement by Dr. Harlene Salts   . SHOULDER SURGERY Right 2019    Rotator cuff           Family History:    Family History   Problem Relation Age of Onset   . No known problems Mother    . No known problems Father            Social History:    Social History     Tobacco Use   . Smoking status: Never   . Smokeless tobacco: Never   Vaping Use   . Vaping Use: Never used   Substance Use Topics   . Alcohol use: Not Currently   . Drug use: Yes     Types: Marijuana     Comment: Gummie edibles for pain and anxiety           The following sections were reviewed this encounter by the provider:            Vital Signs:    There were no vitals taken for this visit.         ROS:    Review of Systems           Physical Exam:    Physical Exam         Assessment/Plan:    There are no diagnoses linked to this encounter.              Follow-up:    No follow-ups on file.         Gaetano Hawthorne, DNP FNP

## 2021-08-12 ENCOUNTER — Ambulatory Visit (INDEPENDENT_AMBULATORY_CARE_PROVIDER_SITE_OTHER): Payer: Medicare Other | Admitting: Physician Assistant

## 2021-08-13 ENCOUNTER — Encounter (INDEPENDENT_AMBULATORY_CARE_PROVIDER_SITE_OTHER): Payer: Self-pay | Admitting: Family

## 2021-08-13 ENCOUNTER — Ambulatory Visit (INDEPENDENT_AMBULATORY_CARE_PROVIDER_SITE_OTHER): Payer: Medicare Other | Admitting: Family

## 2021-08-13 VITALS — BP 130/68 | HR 77 | Temp 98.7°F | Ht 61.0 in | Wt 150.0 lb

## 2021-08-13 DIAGNOSIS — F32A Depression, unspecified: Secondary | ICD-10-CM

## 2021-08-13 DIAGNOSIS — F419 Anxiety disorder, unspecified: Secondary | ICD-10-CM

## 2021-08-13 DIAGNOSIS — Z0000 Encounter for general adult medical examination without abnormal findings: Secondary | ICD-10-CM

## 2021-08-13 DIAGNOSIS — M069 Rheumatoid arthritis, unspecified: Secondary | ICD-10-CM

## 2021-08-13 DIAGNOSIS — Z79899 Other long term (current) drug therapy: Secondary | ICD-10-CM

## 2021-08-13 DIAGNOSIS — I10 Essential (primary) hypertension: Secondary | ICD-10-CM

## 2021-08-13 DIAGNOSIS — Z1211 Encounter for screening for malignant neoplasm of colon: Secondary | ICD-10-CM

## 2021-08-13 DIAGNOSIS — E782 Mixed hyperlipidemia: Secondary | ICD-10-CM

## 2021-08-13 DIAGNOSIS — N1831 Chronic kidney disease, stage 3a: Secondary | ICD-10-CM | POA: Insufficient documentation

## 2021-08-13 DIAGNOSIS — E1142 Type 2 diabetes mellitus with diabetic polyneuropathy: Secondary | ICD-10-CM

## 2021-08-13 DIAGNOSIS — Z1231 Encounter for screening mammogram for malignant neoplasm of breast: Secondary | ICD-10-CM

## 2021-08-13 DIAGNOSIS — I129 Hypertensive chronic kidney disease with stage 1 through stage 4 chronic kidney disease, or unspecified chronic kidney disease: Secondary | ICD-10-CM

## 2021-08-13 DIAGNOSIS — E1122 Type 2 diabetes mellitus with diabetic chronic kidney disease: Secondary | ICD-10-CM

## 2021-08-13 DIAGNOSIS — E039 Hypothyroidism, unspecified: Secondary | ICD-10-CM

## 2021-08-13 DIAGNOSIS — Z7985 Long-term (current) use of injectable non-insulin antidiabetic drugs: Secondary | ICD-10-CM

## 2021-08-13 DIAGNOSIS — E11618 Type 2 diabetes mellitus with other diabetic arthropathy: Secondary | ICD-10-CM

## 2021-08-13 NOTE — Progress Notes (Signed)
[facility] [place] [facility]            Claudia Adams is a 75 y.o. female who presents today for the following Medicare Wellness Visit:  []  Initial Preventive Physical Exam (IPPE) - "Welcome to Medicare" preventive visit (Vision Screening required)   []  Annual Wellness Visit - Initial  [x]  Annual Wellness Visit - Subsequent     Health Risk Assessment:   During the past month, how would you rate your general health?:  Very Good  Which of the following tasks can you do without assistance - drive or take the bus alone; shop for groceries or clothes; prepare your own meals; do your own housework/laundry; handle your own finances/pay bills; eat, bathe or get around your home?: Drive or take the bus alone, Do your own housework/laundry, Handle your own finances/pay bills, Shop for groceries or clothes, Prepare your own meals, Eat, bathe, dress or get around your home  Which of the following problems have you been bothered by in the past month - dizzy when standing up; problems using the phone; feeling tired or fatigued; moderate or severe body pain?: None of these  Do you exercise for about 20 minutes 3 or more days per week?:Yes  During the past month was someone available to help if you needed and wanted help?  For example, if you felt nervous, lonely, got sick and had to stay in bed, needed someone to talk to, needed help with daily chores or needed help just taking care of yourself.: Yes  Do you always wear a seat belt?: Yes  Do you have any trouble taking medications the way you have been told to take them?: No  Have you been given any information that can help you with keeping track of your medications?: Yes  Do you have trouble paying for your medications?: No  Have you been given any information that can help you with hazards in your house, such as scatter rugs, furniture, etc?: Yes  Do you feel unsteady when standing or walking?: Yes  Do you worry about falling?: No  Have  you fallen two or more times in the past year?: No  Did you suffer any injuries from your falls in the past year?: No     Care Team:   Patient Care Team:  Oppelt, Wayne Both, PA as PCP - General (Physician Assistant)      Hospitalizations:   Hospitalization within past year: [x]  No  []  Yes     Diagnosis:      Screenings:   Ambulatory Screenings 08/18/2020 08/13/2021   Falls Risk: [place] more than 2 times in past year N N   Falls Risk: Suffer any injuries? N N        Substance Use Disorder Screen:  In the past year, how often have you used the following?  1) Alcohol (For men, 5 or more drinks a day. For women, 4 or more drinks a day)  [x]  Never []  Once or Twice []  Monthly []  Weekly []  Daily or Almost Daily  2) Tobacco Products  [x]  Never []  Once or Twice []  Monthly []  Weekly []  Daily or Almost Daily  3) Prescription Drugs for Non-Medical Reasons  [x]  Never []  Once or Twice []  Monthly []  Weekly []  Daily or Almost Daily  4) Illegal Drugs  [x]  Never []  Once or Twice []  Monthly []  Weekly []  Daily or Almost Daily       Functional Ability/Level of Safety:  Falls Risk/Home Safety Assessment:  ( see HRA and Screenings sections for additional assessment)  Home Safety: []  Stair handrails  [x]  Skid-resistant rugs/remove throw rugs   [x]  Grab bars  [x]  Clear pathways between rooms  [x]  Proper lighting stairs/ bathrooms/bedrooms  Get Up and Go (optional):  [x]   <20 secs  [x]   >20 secs    []   High risk for falls - Home Safety/Falls Risk Precautions reviewed with pt/family    Hearing Assessment:  Concerns for hearing loss: []  Yes  [x]   No  Hearing aids:   []   Right  []   Left  []   Bilateral   [x]   None  Whisper Test (optional):  []  Normal  []   Slightly decreased  []   Significantly decreased    Exercise:  Frequency:  []   No formal exercise  []   1-2x/wk  []   3-4x/wk  [x]   >4x/wk  Duration:  [x]   15-30 mins/day  []   30-45 mins/day  []   45+ mins/day  Intensity:  [x]   Light  []   Moderate  []   Heavy        Activities of Daily Living:   ADL's  Independent Minimal  Assistance Moderate  Assistance Total   Assistance   Bathing [x]  []  []  []    Dressing [x]  []  []  []    Mobility   [x]  []  []  []    Transfer [x]  []  []  []    Eating [x]  []  []  []    Toileting [x]  []  []  []      IADL's Independent Minimal  Assistance Moderate  Assistance Total   Assistance   Phone [x]  []  []  []    Housekeeping [x]  []  []  []    Laundry [x]  []  []  []    Transportation [x]  []  []  []    Medications [x]  []  []  []    Finances [x]  []  []  []       ADL assistance: [x]  No assistance needed  []  Spouse  []  Sibling  []  Son   []  Daughter []  Children  []  Home Health Aide []  Other:       Advance Care Planning:   Discussion of Advance Directives:   []  Advance Directive in chart  [x]  Advance Directive not in chart - requested to provide []  No Advance Directive.  Form Provided  []  No Advance Directive.  Pt declines. []  Not addressed today  []  Other:     Exam:   Temp 98.7 F (37.1 C)   Ht 1.549 m (5\' 1" )   Wt 68 kg (150 lb)   BMI 28.34 kg/m      Physical Exam      Evaluation of Cognitive Function:   Mood/affect: [x]  Appropriate  []   Other:   Appearance: [x]  Neatly groomed  [x]  Adequately nourished  []  Other:  Family member/caregiver input: []  Present - no concerns  [x]   Not present in room  []  Present - concerns:    Cognitive Assessment:  Mini-Cog Result (three word registration- banana, sunrise, chair / clock drawing):   []   > 3 points - negative screen for dementia   [x]  3 recalled words - negative screen for dementia   []  1-2 recalled words and normal clock draw - negative for cognitive impairment   []  1-2 recalled words and abnormal clock draw - positive for cognitive impairment   []  0 recalled words - positive for cognitive impairment     Assessment/Plan:   1. Essential hypertension    2. Stage 3a chronic kidney disease    3. Rheumatoid arthritis involving multiple sites,  unspecified whether rheumatoid factor present    4. Type 2 diabetes mellitus with other diabetic arthropathy, without long-term current use of  insulin    5. Hypothyroidism, unspecified type    6. Polyneuropathy due to type 2 diabetes mellitus    7. Anxiety and depression    8. Encounter for screening mammogram for malignant neoplasm of breast           Perlie Gold, RN    08/13/2021     The following sections were reviewed this encounter by the provider:         History:   Patient Active Problem List   Diagnosis    Seasonal allergic rhinitis    Rheumatoid arthritis    Diabetes mellitus    Essential hypertension    Hyperlipidemia    Hypothyroidism    Anxiety and depression    Primary localized osteoarthrosis of ankle and foot    Polyneuropathy due to type 2 diabetes mellitus    Closed fracture of proximal phalanx of middle finger    Anxiety    Stage 3a chronic kidney disease      Past Medical History:   Diagnosis Date    Anxiety     Diabetes mellitus     Disorder of thyroid     Hypertension     Rheumatoid arthritis     Seasonal allergic rhinitis      Past Surgical History:   Procedure Laterality Date    KNEE SURGERY Right 11/30/2019    knee replacement by Dr. Harlene Salts    SHOULDER SURGERY Right 2019    Rotator cuff     Allergies   Allergen Reactions    Penicillin G      Reports allergic as a child        Outpatient Medications Marked as Taking for the 08/13/21 encounter (Office Visit) with Gaetano Hawthorne, DNP FNP   Medication Sig Dispense Refill    Accu-Chek SmartView test strip MONITER BLOOD GLUCOSE DAILY 100 strip 3    BD Pen Needle Nano 2nd Gen 32G X 4 MM Misc Use once daily as directed. 100 each 1    fluticasone (FLONASE) 50 MCG/ACT nasal spray 1 spray by Nasal route as needed for Allergies 18 mL 2    folic acid (FOLVITE) 1 MG tablet TAKE 1 TABLET BY MOUTH EVERY DAY 90 tablet 0    gabapentin (NEURONTIN) 300 MG capsule TAKE 1 CAPSULE BY MOUTH THREE TIMES A DAY 270 capsule 1    hydroxychloroquine (PLAQUENIL) 200 MG tablet Take 200 mg by mouth daily         inFLIXimab (REMICADE IV) every 28 days      levothyroxine (SYNTHROID) 25 MCG tablet Take 1  tablet (25 mcg total) by mouth Once a day at 6:00am 90 tablet 1    losartan-hydrochlorothiazide (HYZAAR) 100-25 MG per tablet TAKE 1 TABLET BY MOUTH DAILY 30 tablet 0    metoprolol succinate XL (TOPROL-XL) 50 MG 24 hr tablet TAKE 1 TABLET BY MOUTH EVERY DAY 30 tablet 0    Multiple Vitamins-Minerals (Womens 50+ Multi Vitamin/Min) Tab Take 1 tablet by mouth daily      Pitavastatin Magnesium 1 MG Tab Take by mouth      potassium chloride (KLOR-CON) 10 MEQ tablet TAKE 1 TABLET(10 MEQ) BY MOUTH DAILY 90 tablet 1    Semaglutide, 2 MG/DOSE, 8 MG/3ML Solution Pen-injector Inject 2 mg into the skin once a week 9 mL 1    traMADol (ULTRAM) 50 MG  tablet Take 50 mg by mouth every 6 (six) hours as needed for Pain       Social History     Tobacco Use    Smoking status: Never    Smokeless tobacco: Never   Vaping Use    Vaping Use: Never used   Substance Use Topics    Alcohol use: Not Currently    Drug use: Yes     Types: Marijuana     Comment: Gummie edibles for pain and anxiety      Family History   Problem Relation Age of Onset    No known problems Mother     No known problems Father            ===================================================================    Additional Documentation:

## 2021-08-13 NOTE — Patient Instructions (Signed)
MEDICARE WELLNESS PERSONAL PREVENTION PLAN   As part of the Medicare Wellness portion of your visit today, we are providing you with this personalized preventative plan of care. We have listed below some of the preventative services that are recommended for patients based upon their age and gender. These recommendations are taken directly from the [place] [employer] (USPSTF) and the [employer] (ACIP).     Health Maintenance   Topic Date Due    Advance Directive on File  Never done    DXA Scan  Never done    Shingrix Vaccine 50+ (1) Never done    HEPATITIS C SCREENING  Never done    COVID-19 Vaccine (5 - Booster) 08/27/2020    INFLUENZA VACCINE  04/06/2021    FALLS RISK ANNUAL  08/18/2021    DM OPHTHALMOLOGY EXAM  01/13/2022    Statin Use  07/27/2022    HEMOGLOBIN A1C ANNUAL  07/27/2022    URINE MICROALBUMIN  07/27/2022    DEPRESSION SCREENING  08/13/2022    Medicare Annual Wellness Visit  08/13/2022    Tetanus Ten-Year  Discontinued    Pneumonia Vaccine Age 61+  Discontinued       Health Maintenance Topics with due status: Overdue       Topic Date Due    Advance Directive on File Never done    DXA Scan Never done    Shingrix Vaccine 50+ Never done    HEPATITIS C SCREENING Never done    COVID-19 Vaccine 08/27/2020    INFLUENZA VACCINE 04/06/2021        Immunization History   Administered Date(s) Administered    COVID-19 mRNA MONOVALENT vaccine PRIMARY SERIES 12 years and above [employer]) 30 mcg/0.3 mL (DILUTE BEFORE USE) 09/08/2019, 09/27/2019, 10/18/2019, 07/02/2020    Influenza (Im) Preservative Free 04/26/2020    Influenza quadrivalent (IM) 6 months & up PRESERVED (Afluria/Fluzone) 04/26/2020, 05/27/2020    Pneumococcal 23 valent 07/07/2000, 08/03/2000        Your major risk factors:   Recommendations for improvement:      The list below includes many common screening recommendations but is not meant to be comprehensive. You may be eligible for other  preventative services depending upon your personal risk factors.   Colorectal Cancer Screening - All adults age 52-75 should undergo periodic colorectal cancer screening. The decision to screen for colorectal cancer in adults aged 47 to 41 years should be an individual one,taking into account your overall health and prior screening history.   Breast Cancer Screening - Women age 26-74 should have mammograms every other year (please note that this recommendation may not be appropriate for every woman - your physician can answer specific questions you may have). The USPSTF concludes that the current evidence is insufficient to assess the balance of benefits and harms of screening mammography in women aged 32 years or older.    Cervical Cancer Screening - Women over 17 do not require pap smears as long as prior screening has been normal and are not otherwise at high risk for cervical cancer. For women aged 50 to 36 years, the USPSTF recommends screening every 3 years with cervical cytology alone, every 5 years with high-risk human papillomavirus (hrHPV) testing alone, or every 5 years with hrHPV testing in combination with cytology (cotesting).   Osteoporosis Screening -  The USPSTF recommends screening for osteoporosis with bone measurement testing to prevent osteoporotic fractures in women 65 years and older.  Hepatitis C Screening -  Recommend screening for hepatitis C virus (HCV) infection in all adults aged 66 to 48 years.  Lung cancer Screening - Recommend annual screening for lung cancer with low-dose computed tomography (LDCT) in adults ages 49 to 21 years who have a 20 pack-year smoking history and currently smoke or have quit within the past 15 years.  Recommended Vaccinations   Influenza one dose annually   Tetanus/diphtheria one booster every 10 years   Zoster/Shingles - Shingrix two doses after age 9 (second dose given 2-6 months after first dose)  Pneumovax (PPSV23) one dose for adults aged ?65  years  Prevnar(PCV13) shared clinical decision-making is recommended regarding administration of this vaccine to persons aged ?65 years who do not have an immunocompromising condition, cerebrospinal fluid leak, or cochlear implant.     PERSONAL PREVENTION PLAN   Your Personal Prevention Plan is based on your overall health and your responses to the health questionnaire you completed. The following information is for you to review in addition to the recommendations, referrals, and tests we have discussed at your visit.     Physical Activity:   Physical activity can help you maintain a healthy weight, prevent or control illness, reduce stress, and sleep better. It can also help you improve your balance to avoid falls. Try to build up to and maintain a total of 30 minutes of activity each day. If you are able, try walking, doing yard or housework, and taking the stairs more often. You can also strengthen your muscles with exercises done while sitting or lying down.   Emotional Health:   Feeling "down in the dumps" or anxious every now and then is a natural part of life. If this feeling lasts for a few weeks or more, talk with me as soon as possible. It could be a sign of a problem that needs treatment. There are many types of treatment available.   Falls:   You can reduce your risk of falling by making changes in your home. Remove items that may cause tripping, improve lighting, and consider installing grab bars.   Talk with me if you have problems with balance and walking. To prevent falls, you may need your vision, hearing, or blood pressure checked. Exercises to improve your strength and balance, or using a cane or walker, may help. Review your medicines with me at every visit, because some can affect balance. Please be sure to let me know if you fall or are fearful you may fall.   Urinary Leakage:   Urine leakage is common, but it is not a normal part of aging. Talk with me about any urine leakage so that the cause  can be found and treated. Treatment can include bladder training, exercises, medicine or surgery.   Pain:   We all have aches and pains at times, but chronic pain can change how you feel and live every day. Please talk with me about any symptoms of chronic pain so that we can determine how best to treat.   Sleep:   Getting a good night's sleep is vital to your health and well-being and can help prevent or manage health problems. Often, sleep can be improved by changing behaviors, including when you go to bed and what you do before bed. Sleep apnea can cause problems such as struggling to stay awake during the day. Please let me know if you would like to learn more about improving your sleep and/or think you may have sleep apnea.   Seat Belt:  Please remember to wear a seat belt when driving or riding in a vehicle. It is one of the most important things you can do to stay safe in a car.   Nutrition:   Remember to eat plenty of fruits, vegetables, whole grains, and dairy. Drink at least 64 ounces (8 full glasses) of water a day, unless you have been advised to limit fluids.   Alcohol:   Alcohol can have a greater effect on older people, who may feel its effects at a lower amount. Older people should limit alcoholic drinks (no more than one a day for women and no more than two a day for men). Please let me know if alcohol use becomes a problem.   Tobacco:   Not smoking or using other forms of tobacco is one of the most important things you can do for your health. Here is some more information about the importance of quitting smoking and how to quit smoking - [employer]  Advance Directives:   There may come a time when medical decisions need to be made on your behalf. Please talk with your family, and with me, about your wishes. It is important to provide information about your decisions, and any formal advance directives, for your medical record. Here is additional information  on advanced directives - [employer]Check.com.ee.html  Additional Support:   Sometimes it can be challenging to manage all aspects of daily life. Finding the right support can help you maintain or improve your health and independence. Please let me know if you would like to talk further about finding resources to assist you.

## 2021-08-18 ENCOUNTER — Encounter (INDEPENDENT_AMBULATORY_CARE_PROVIDER_SITE_OTHER): Payer: Self-pay | Admitting: Family

## 2021-09-02 ENCOUNTER — Other Ambulatory Visit (INDEPENDENT_AMBULATORY_CARE_PROVIDER_SITE_OTHER): Payer: Self-pay | Admitting: Physician Assistant

## 2021-09-02 NOTE — Telephone Encounter (Signed)
Just forwarding along since it looks like she most recently saw you for chronic care. Not sure she needs to continue to be on this medication. Thank you.

## 2021-09-09 ENCOUNTER — Other Ambulatory Visit (INDEPENDENT_AMBULATORY_CARE_PROVIDER_SITE_OTHER): Payer: Self-pay | Admitting: Rheumatology

## 2021-09-17 ENCOUNTER — Other Ambulatory Visit (INDEPENDENT_AMBULATORY_CARE_PROVIDER_SITE_OTHER): Payer: Self-pay | Admitting: Physician Assistant

## 2021-09-17 DIAGNOSIS — M069 Rheumatoid arthritis, unspecified: Secondary | ICD-10-CM

## 2021-09-18 ENCOUNTER — Encounter (INDEPENDENT_AMBULATORY_CARE_PROVIDER_SITE_OTHER): Payer: Self-pay

## 2021-09-18 ENCOUNTER — Other Ambulatory Visit (INDEPENDENT_AMBULATORY_CARE_PROVIDER_SITE_OTHER): Payer: Self-pay | Admitting: Family

## 2021-09-18 ENCOUNTER — Telehealth (INDEPENDENT_AMBULATORY_CARE_PROVIDER_SITE_OTHER): Payer: Self-pay | Admitting: Family

## 2021-09-18 DIAGNOSIS — M0609 Rheumatoid arthritis without rheumatoid factor, multiple sites: Secondary | ICD-10-CM

## 2021-09-18 MED ORDER — GABAPENTIN 300 MG PO CAPS
300.0000 mg | ORAL_CAPSULE | Freq: Two times a day (BID) | ORAL | 0 refills | Status: DC | PRN
Start: 2021-09-18 — End: 2021-09-25

## 2021-09-18 NOTE — Telephone Encounter (Signed)
Pt called. Pt was requesting refills on Rx: gabapentin (NEURONTIN) 300 MG capsule. Pt now sees Fleet Contras, was last seen by Fleet Contras on 08/13/21, however, this was last Rx'd by Amy Oppelt. Pls advise if able to send refills or if appt is needed. Thanks! GT

## 2021-09-18 NOTE — Telephone Encounter (Signed)
LVM & MyChart. Thanks for confirming!     GT

## 2021-09-22 ENCOUNTER — Other Ambulatory Visit (INDEPENDENT_AMBULATORY_CARE_PROVIDER_SITE_OTHER): Payer: Self-pay | Admitting: Physician Assistant

## 2021-09-22 DIAGNOSIS — E876 Hypokalemia: Secondary | ICD-10-CM

## 2021-09-25 ENCOUNTER — Encounter (INDEPENDENT_AMBULATORY_CARE_PROVIDER_SITE_OTHER): Payer: Self-pay | Admitting: Family

## 2021-09-25 ENCOUNTER — Telehealth (INDEPENDENT_AMBULATORY_CARE_PROVIDER_SITE_OTHER): Payer: Medicare Other | Admitting: Family

## 2021-09-25 DIAGNOSIS — M0609 Rheumatoid arthritis without rheumatoid factor, multiple sites: Secondary | ICD-10-CM

## 2021-09-25 DIAGNOSIS — N1831 Chronic kidney disease, stage 3a: Secondary | ICD-10-CM

## 2021-09-25 DIAGNOSIS — E876 Hypokalemia: Secondary | ICD-10-CM

## 2021-09-25 DIAGNOSIS — E1142 Type 2 diabetes mellitus with diabetic polyneuropathy: Secondary | ICD-10-CM

## 2021-09-25 MED ORDER — POTASSIUM CHLORIDE CRYS ER 10 MEQ PO TBCR
EXTENDED_RELEASE_TABLET | ORAL | 1 refills | Status: DC
Start: 2021-09-25 — End: 2022-02-12

## 2021-09-25 MED ORDER — GABAPENTIN 300 MG PO CAPS
300.0000 mg | ORAL_CAPSULE | Freq: Three times a day (TID) | ORAL | 1 refills | Status: DC | PRN
Start: 2021-10-20 — End: 2022-01-18

## 2021-09-25 NOTE — Progress Notes (Signed)
[facility]                       Date of Virtual Visit: 09/25/2021 2:13 PM        Patient ID: Claudia Adams is a 76 y.o. female.  Attending Physician: Gaetano Hawthorne, DNP FNP       Telemedicine Eligibility:    State Location:  [x]  [place]  []  [place]  []  [place] []  [place]  []  Other:    Physical Location:  [x]  Home  []  Office  []  Other:    Patient Consent to Billing: Yes         Chief Complaint:    Chief Complaint   Patient presents with    Medication Refill               HPI:    Pt presents for med refill:     Medication: Gabapentin 300mg    Med compliance: I capsule per day   Response to medication: responds well, no complaints   No breakthrough s/s    Medication: Klor-con 10 MEQ Daily   Med compliance: daily as prescribed   Response to medication: responds well, no complaints   No breakthrough s/s                Problem List:    Patient Active Problem List   Diagnosis    Rheumatoid arthritis    Diabetes mellitus    Essential hypertension    Hyperlipidemia    Hypothyroidism    Anxiety and depression    Primary localized osteoarthrosis of ankle and foot    Polyneuropathy due to type 2 diabetes mellitus    Stage 3a chronic kidney disease             Current Meds:    Outpatient Medications Marked as Taking for the 09/25/21 encounter (Telemedicine Visit) with Gaetano Hawthorne, DNP FNP   Medication Sig Dispense Refill    Accu-Chek SmartView test strip MONITER BLOOD GLUCOSE DAILY 100 strip 3    BD Pen Needle Nano 2nd Gen 32G X 4 MM Misc Use once daily as directed. 100 each 1    fluticasone (FLONASE) 50 MCG/ACT nasal spray 1 spray by Nasal route as needed for Allergies 18 mL 2    folic acid (FOLVITE) 1 MG tablet TAKE 1 TABLET BY MOUTH EVERY DAY 90 tablet 0    hydroxychloroquine (PLAQUENIL) 200 MG tablet Take 200 mg by mouth daily         inFLIXimab (REMICADE IV) every 28 days      levothyroxine (SYNTHROID) 25 MCG tablet Take 1 tablet (25 mcg total) by mouth Once a day at  6:00am 90 tablet 1    losartan-hydrochlorothiazide (HYZAAR) 100-25 MG per tablet TAKE 1 TABLET BY MOUTH DAILY 30 tablet 0    metoprolol succinate XL (TOPROL-XL) 50 MG 24 hr tablet TAKE 1 TABLET BY MOUTH EVERY DAY 30 tablet 0    Multiple Vitamins-Minerals (Womens 50+ Multi Vitamin/Min) Tab Take 1 tablet by mouth daily      Pitavastatin Magnesium 1 MG Tab Take by mouth      Semaglutide, 2 MG/DOSE, 8 MG/3ML Solution Pen-injector Inject 2 mg into the skin once a week 9 mL 1    traMADol (ULTRAM) 50 MG tablet Take 50 mg by mouth every 6 (six) hours as needed for Pain      [DISCONTINUED] gabapentin (NEURONTIN) 300 MG capsule Take 1 capsule (300 mg) by mouth  2 (two) times daily as needed (pain) 60 capsule 0          Allergies:    Allergies   Allergen Reactions    Penicillin G      Reports allergic as a child               Past Surgical History:    Past Surgical History:   Procedure Laterality Date    KNEE SURGERY Right 11/30/2019    knee replacement by Dr. Harlene Salts    SHOULDER SURGERY Right 2019    Rotator cuff           Family History:    Family History   Problem Relation Age of Onset    No known problems Mother     No known problems Father            Social History:    Social History     Tobacco Use    Smoking status: Never    Smokeless tobacco: Never   Vaping Use    Vaping Use: Never used   Substance Use Topics    Alcohol use: Not Currently    Drug use: Yes     Types: Marijuana     Comment: Gummie edibles for pain and anxiety           The following sections were reviewed this encounter by the provider:   Tobacco  Allergies  Meds  Problems  Med Hx  Surg Hx  Fam Hx             Vital Signs:    Wt 67.6 kg (149 lb)   BMI 28.15 kg/m          ROS:    Review of Systems   Constitutional:  Negative for fever.   Musculoskeletal:  Negative for gait problem.            Physical Exam:    Physical Exam   GENERAL APPEARANCE: well developed, well nourished, no acute distress  HEAD: normal appearance, atraumatic, no  swelling of face  NECK/THYROID: full range of motion without pain  LUNGS/CHEST: unlabored, speaking in full sentences  NEURO: facial movement symmetric, normal speech, normal head movement, normal gait  PSYCH: alert and oriented, appropriate affect, appropriate mood, normal speech        Assessment/Plan:    1. Hypokalemia  - potassium chloride (KLOR-CON M10) 10 MEQ CR tablet; TAKE 1 TABLET(10 MEQ) BY MOUTH DAILY  Dispense: 90 tablet; Refill: 1    2. Polyneuropathy due to type 2 diabetes mellitus  - gabapentin (NEURONTIN) 300 MG capsule; Take 1 capsule (300 mg) by mouth 3 (three) times daily as needed (pain)  Dispense: 180 capsule; Refill: 1  -Takes it 3 times a day, and it gives her a lot of relief.   -Neuropathy in bilateral feet  -No difficulty with walking currently.   -Tolerates the side effects of the gabapentin very well    3. Stage 3a chronic kidney disease    4. Rheumatoid arthritis of multiple sites with negative rheumatoid factor  -Continue to follow Dr. Donette Larry                Follow-up:    Return if symptoms worsen or fail to improve.         Gaetano Hawthorne, DNP FNP

## 2021-09-26 ENCOUNTER — Other Ambulatory Visit (INDEPENDENT_AMBULATORY_CARE_PROVIDER_SITE_OTHER): Payer: Self-pay | Admitting: "Endocrinology

## 2021-09-26 DIAGNOSIS — E039 Hypothyroidism, unspecified: Secondary | ICD-10-CM

## 2021-09-28 ENCOUNTER — Encounter (INDEPENDENT_AMBULATORY_CARE_PROVIDER_SITE_OTHER): Payer: Self-pay | Admitting: Family Medicine

## 2021-09-30 ENCOUNTER — Other Ambulatory Visit (INDEPENDENT_AMBULATORY_CARE_PROVIDER_SITE_OTHER): Payer: Self-pay

## 2021-09-30 DIAGNOSIS — E1142 Type 2 diabetes mellitus with diabetic polyneuropathy: Secondary | ICD-10-CM

## 2021-09-30 MED ORDER — SEMAGLUTIDE (2 MG/DOSE) 8 MG/3ML SC SOPN
2.0000 mg | PEN_INJECTOR | SUBCUTANEOUS | 0 refills | Status: DC
Start: 2021-09-30 — End: 2021-12-25

## 2021-09-30 NOTE — Telephone Encounter (Signed)
UV with Dr. Donette Larry 10/26/21  LV 07/27/21

## 2021-10-26 ENCOUNTER — Telehealth (INDEPENDENT_AMBULATORY_CARE_PROVIDER_SITE_OTHER): Payer: Medicare Other | Admitting: Endocrinology, Diabetes and Metabolism

## 2021-11-02 ENCOUNTER — Other Ambulatory Visit (INDEPENDENT_AMBULATORY_CARE_PROVIDER_SITE_OTHER): Payer: Self-pay | Admitting: Family

## 2021-11-02 DIAGNOSIS — I10 Essential (primary) hypertension: Secondary | ICD-10-CM

## 2021-11-02 MED ORDER — LOSARTAN POTASSIUM-HCTZ 100-25 MG PO TABS
1.0000 | ORAL_TABLET | Freq: Every day | ORAL | 1 refills | Status: DC
Start: 2021-11-02 — End: 2022-02-12

## 2021-11-02 NOTE — Telephone Encounter (Signed)
Patient called requesting refill. Patient would like multiple refills. Thanks,cap

## 2021-11-09 ENCOUNTER — Encounter (INDEPENDENT_AMBULATORY_CARE_PROVIDER_SITE_OTHER): Payer: Self-pay | Admitting: "Endocrinology

## 2021-11-09 ENCOUNTER — Ambulatory Visit (INDEPENDENT_AMBULATORY_CARE_PROVIDER_SITE_OTHER): Payer: Medicare Other | Admitting: "Endocrinology

## 2021-11-09 VITALS — BP 134/77 | HR 80 | Temp 97.5°F | Resp 14 | Ht 61.0 in | Wt 144.0 lb

## 2021-11-09 DIAGNOSIS — E039 Hypothyroidism, unspecified: Secondary | ICD-10-CM

## 2021-11-09 DIAGNOSIS — Z79890 Hormone replacement therapy (postmenopausal): Secondary | ICD-10-CM

## 2021-11-09 DIAGNOSIS — I10 Essential (primary) hypertension: Secondary | ICD-10-CM

## 2021-11-09 DIAGNOSIS — E782 Mixed hyperlipidemia: Secondary | ICD-10-CM

## 2021-11-09 DIAGNOSIS — E1142 Type 2 diabetes mellitus with diabetic polyneuropathy: Secondary | ICD-10-CM

## 2021-11-09 DIAGNOSIS — Z79899 Other long term (current) drug therapy: Secondary | ICD-10-CM

## 2021-11-09 DIAGNOSIS — Z794 Long term (current) use of insulin: Secondary | ICD-10-CM

## 2021-11-09 DIAGNOSIS — Z7985 Long-term (current) use of injectable non-insulin antidiabetic drugs: Secondary | ICD-10-CM

## 2021-11-09 LAB — T4, FREE: T4 Free: 1 ng/dL (ref 0.69–1.48)

## 2021-11-09 LAB — COMPREHENSIVE METABOLIC PANEL
ALT: 18 U/L (ref 0–55)
AST (SGOT): 24 U/L (ref 5–41)
Albumin/Globulin Ratio: 1.5 (ref 0.9–2.2)
Albumin: 3.8 g/dL (ref 3.5–5.0)
Alkaline Phosphatase: 57 U/L (ref 37–117)
Anion Gap: 14 (ref 5.0–15.0)
BUN: 11 mg/dL (ref 7.0–21.0)
Bilirubin, Total: 1 mg/dL (ref 0.2–1.2)
CO2: 27 mEq/L (ref 17–29)
Calcium: 8.9 mg/dL (ref 7.9–10.2)
Chloride: 103 mEq/L (ref 99–111)
Creatinine: 1.2 mg/dL — ABNORMAL HIGH (ref 0.4–1.0)
Globulin: 2.6 g/dL (ref 2.0–3.6)
Glucose: 236 mg/dL — ABNORMAL HIGH (ref 70–100)
Potassium: 3.8 mEq/L (ref 3.5–5.3)
Protein, Total: 6.4 g/dL (ref 6.0–8.3)
Sodium: 144 mEq/L (ref 135–145)

## 2021-11-09 LAB — HEMOLYSIS INDEX: Hemolysis Index: 5 Index (ref 0–24)

## 2021-11-09 LAB — LIPID PANEL
Cholesterol / HDL Ratio: 2.7 Index
Cholesterol: 171 mg/dL (ref 0–199)
HDL: 64 mg/dL (ref 40–9999)
LDL Calculated: 90 mg/dL (ref 0–99)
Triglycerides: 84 mg/dL (ref 34–149)
VLDL Calculated: 17 mg/dL (ref 10–40)

## 2021-11-09 LAB — HEMOGLOBIN A1C
Average Estimated Glucose: 116.9 mg/dL
Hemoglobin A1C: 5.7 % (ref 4.6–5.9)

## 2021-11-09 LAB — MICROALBUMIN, RANDOM URINE
Urine Creatinine, Random: 236.8 mg/dL
Urine Microalbumin, Random: 33 ug/ml — ABNORMAL HIGH (ref 0.0–30.0)
Urine Microalbumin/Creatinine Ratio: 14 ug/mg (ref 0–30)

## 2021-11-09 LAB — GFR: EGFR: 52.9

## 2021-11-09 LAB — TSH: TSH: 1.67 u[IU]/mL (ref 0.35–4.94)

## 2021-11-09 NOTE — Progress Notes (Signed)
Subjective:      Date: 11/09/2021 10:45 AM   Patient ID: Claudia Adams is a 76 y.o. female.    Chief Complaint:  Chief Complaint   Patient presents with    Type 2 diabetes mellitus with diabetic polyneuropathy, with         HPI  Visit type:  follow up   Type: Type II and insulin requiring  Evaluation:  diagnosed 20 years ago   Last follow-up:  07/27/21    Diabetes Medication Regimen: Ozempic 2 mg weekly   Medication effectiveness/adherence: working well and general adherence with medications  Medication side effects: none  Presenting symptoms at time of diagnosis: abnormal screening glucose     Prior medications: Metformin d/c due to previously low  kidney function; [place].     Interval events:   Clinically stable since last seen about 3 months ago   Has RA and OA  She continues on Remicade and dose increased recently.   Has chronic fatigue and joint pain due to RA and OA     Was discharged from Nephro due to stable kidney function     Current symptoms: tingling/numbness in extremities - not worse from prior and Nuer[place]in is helping   Patient denies: no other symptoms including chest pain, shortness of breath, hypoglycemic episodes, polydipsia, polyuria, headaches, dizziness, or abdominal pain  Exercise: walking 34x daily, short walks, has been consistent with exercise regimen     Diet: moderate compliance with recommended diet; has cut out most refined sugar, has been reading food labels chooses carb controlled and low saturated food   Complications: diabetic peripheral neuropathy  Pertinent medical history: hyperlipidemia, hypertension, hypothyroidism   Home glucose readings:  reviewed glucose meter :  Low to mid 100s fasting and 2 hours post dinner   Occasionally low 200s     A1c result: 5.8 (07/27/21)   A1c trend is decreasing and at goal  Microalbumin trend: No results found for: MICROALBUMIN  Microalbumin trend is negative  LDL trend:   Lab Results   Component Value Date    LDL 92 07/27/2021    LDL 99  03/26/2021    LDL 96 11/25/2020     LDL trend is  slightly above target goal of < 70   Additional concerns: Pt has stable essential HTN with no evidence of CHF or proteinuria.  The patient is compliant with anti-hypertensive therapy and denies side effects to therapy.  Pt denies CP, SOB, dizziness, orthopnea, PND or edema.  Pt has hyperlipidemia and is compliant with lipid therapy.  Pt denies side effects of lipid therapy - specifically denies myalgia.  Pt has hypothyroidism - compliant with current regimen.  Denies side effects to therapy.  Denies any change in hair/nails/skin, constipation, unexplained wt changes.    Current thyroid hormone levels are normal  Clinically stable  On levothyroxine 25 mcg daily and reports good compliance with therapy.    History of obesity   Weight (03/26/21):  176 lbs  Weight (07/27/21) 159 lbs   Weight (11/09/21)      144 lbs     Saw retina specialist in May 2022, did not note retinopathy     Problem List:  Patient Active Problem List   Diagnosis    Rheumatoid arthritis    Diabetes mellitus    Essential hypertension    Hyperlipidemia    Hypothyroidism    Anxiety and depression    Primary localized osteoarthrosis of ankle and foot    Polyneuropathy due  to type 2 diabetes mellitus    Stage 3a chronic kidney disease       Current Medications:  Current Outpatient Medications   Medication Sig Dispense Refill    Accu-Chek SmartView test strip MONITER BLOOD GLUCOSE DAILY 100 strip 3    BD Pen Needle Nano 2nd Gen 32G X 4 MM Misc Use once daily as directed. 100 each 1    fluticasone (FLONASE) 50 MCG/ACT nasal spray 1 spray by Nasal route as needed for Allergies 18 mL 2    folic acid (FOLVITE) 1 MG tablet TAKE 1 TABLET BY MOUTH EVERY DAY 90 tablet 0    gabapentin (NEURONTIN) 300 MG capsule Take 1 capsule (300 mg) by mouth 3 (three) times daily as needed (pain) 180 capsule 1    hydroxychloroquine (PLAQUENIL) 200 MG tablet Take 200 mg by mouth daily         inFLIXimab (REMICADE IV) every 28 days       levothyroxine (SYNTHROID) 25 MCG tablet TAKE 1 TABLET(25 MCG) BY MOUTH EVERY DAY AT 6 AM 90 tablet 1    losartan-hydrochlorothiazide (HYZAAR) 100-25 MG per tablet Take 1 tablet by mouth daily 90 tablet 1    metoprolol succinate XL (TOPROL-XL) 50 MG 24 hr tablet TAKE 1 TABLET BY MOUTH EVERY DAY 30 tablet 0    Multiple Vitamins-Minerals (Womens 50+ Multi Vitamin/Min) Tab Take 1 tablet by mouth daily      Pitavastatin Magnesium 1 MG Tab Take by mouth      potassium chloride (KLOR-CON M10) 10 MEQ CR tablet TAKE 1 TABLET(10 MEQ) BY MOUTH DAILY 90 tablet 1    predniSONE (DELTASONE) 5 MG tablet TAKE 1 TABLET BY MOUTH EVERY DAY AS NEEDED FOR FLARES (Patient not taking: Reported on 09/25/2021)      Semaglutide, 2 MG/DOSE, 8 MG/3ML Solution Pen-injector Inject 2 mg into the skin once a week 9 mL 0    traMADol (ULTRAM) 50 MG tablet Take 50 mg by mouth every 6 (six) hours as needed for Pain       No current facility-administered medications for this visit.       Allergies:  Allergies   Allergen Reactions    Penicillin G      Reports allergic as a child         Past Medical History:  Past Medical History:   Diagnosis Date    Anxiety     Diabetes mellitus     Disorder of thyroid     Hypertension     Rheumatoid arthritis     Seasonal allergic rhinitis        Past Surgical History:  Past Surgical History:   Procedure Laterality Date    KNEE SURGERY Right 11/30/2019    knee replacement by Dr. Harlene Salts    SHOULDER SURGERY Right 2019    Rotator cuff       Family History:  Family History   Problem Relation Age of Onset    No known problems Mother     No known problems Father        Social History:  Social History     Socioeconomic History    Marital status: Widowed   Tobacco Use    Smoking status: Never    Smokeless tobacco: Never   Vaping Use    Vaping Use: Never used   Substance and Sexual Activity    Alcohol use: Not Currently    Drug use: Yes     Types: Marijuana  Comment: Gummie edibles for pain and anxiety        The  following sections were reviewed this encounter by the provider:   Tobacco  Meds         Vitals:  BP 134/77 (BP Site: Left arm, Patient Position: Sitting, Cuff Size: Medium)   Pulse 80   Temp 97.5 F (36.4 C) (Temporal)   Resp 14   Ht 1.549 m (5\' 1" )   Wt 65.3 kg (144 lb)   SpO2 96%   BMI 27.21 kg/m      ROS: A complete 12 point ROS was obtained. Pertinent positives and negatives as noted in HPI. All other systems are negative.    Physical Examination     General appearance: alert, appears stated age, cooperative and no distress  Eyes: conjunctivae/corneas clear. PERRL, EOM's intact.   Neck: no cervical lymphadenopathy, no carotid bruit,  supple, symmetrical, trachea midline   Thyroid:normal sized on inspection; non tender to palpation and no discrete nodules palpated  Lungs: clear to auscultation bilaterally  Heart: regular rate and rhythm, S1, S2 normal, no murmur, click, rub or gallop  Abdomen: soft, non-tender; bowel sounds normal; no masses,  no organomegaly  Extremities: extremities normal, atraumatic, no cyanosis or edema  Foot Exam: monofilament testing of plantar aspect of first toe and metatarsal joints (10g monofilament) intact bilaterally, no fissures, maceration, ulceration or lesions bilaterally, normal hair growth bilaterally, normal pigmentation bilaterally and vibratory perception (128 Hz tuning fork) applied to first toe intact bilaterally; Pedal pulses 2+ bilaterally  Skin: Skin color, texture, turgor normal. No rashes or lesions  Neurologic: Alert and oriented X 3, normal strength and tone. Normal symmetric reflexes. No tremor      Assessment and Plan     1. Type 2 diabetes mellitus with diabetic polyneuropathy, without long-term current use of insulin  -   Lab Results   Component Value Date    HGBA1C 5.8 07/27/2021    HGBA1C 6.5 (H) 03/26/2021    HGBA1C 5.8 11/25/2020       Current  A1C is at goal (goal A1C <6.5%).  - was able to get off basal insulin following initiation of glp-1  ra  -  Continue current DM medication regimen.  -  Recommend home glucometer testing twice a day (pre-breakfast or 2 hrs post-prandial).  -  continue carb controlled and current exercise regimen       2. Hypothyroidism, unspecified type  - Patient denies marked or specific signs or symptoms suggesting either thyroid excess or deficiency.   - on Levothyroxine 25 mcg daily   -Most recent thyroid hormone levels are normal  -Recheck thyroid hormone levels and adjust dose if needed upon review of labs     - TSH  - T4, free    3. Mixed hyperlipidemia  - LDL slightly above goal of < 70   - on Pitavastatin 1 mg daily   - plan to increase dose if LDL is still above 70     - Comprehensive metabolic panel  - Lipid panel    4. Hypertension   - well controlled   - continue Losartan-HCTZ 100-25 mg daily and Metoprolol XL 50 mg daily     5. Overweight BMI 27.21  - hx of obesity   - continue calorie restricted diet, carb controlled diet and current exercise regimen       RTC in 12 weeks     Bynum Bellows MSN FNP-BC  [facility]

## 2021-11-10 ENCOUNTER — Other Ambulatory Visit (INDEPENDENT_AMBULATORY_CARE_PROVIDER_SITE_OTHER): Payer: Self-pay | Admitting: "Endocrinology

## 2021-11-10 ENCOUNTER — Telehealth (INDEPENDENT_AMBULATORY_CARE_PROVIDER_SITE_OTHER): Payer: Self-pay

## 2021-11-10 ENCOUNTER — Telehealth: Payer: Self-pay | Admitting: "Endocrinology

## 2021-11-10 DIAGNOSIS — E782 Mixed hyperlipidemia: Secondary | ICD-10-CM

## 2021-11-10 MED ORDER — PITAVASTATIN MAGNESIUM 2 MG PO TABS
2.0000 mg | ORAL_TABLET | Freq: Every day | ORAL | 1 refills | Status: DC
Start: 2021-11-10 — End: 2021-11-11

## 2021-11-10 NOTE — Progress Notes (Signed)
1. A1C is 5.7, in the range of excellent diabetes control, pls continue current dose Ozempic.   2. Creatinine slightly elevated and kidney function slightly low at 52.9 (normal is > 60), but it is stable when compared to prior, recommend adequate water hydration at least 64 oz of water daily to maintain or improve kidney function.   3. Thyroid function is normal, pls continue current dose Levothyroxine 25 mcg daily   4. LDL is slightly elevated above goal recommend increasing pitavastatin to 2 mg daily.

## 2021-11-10 NOTE — Telephone Encounter (Signed)
Reviewed recent lab recommendations patient verbalized understanding.

## 2021-11-10 NOTE — Telephone Encounter (Signed)
Error

## 2021-11-10 NOTE — Telephone Encounter (Signed)
Patient is calling back, someone from the office called. She said there was no VM left due not having her mail box set up. Can someone please call the patient back. [phone]

## 2021-11-11 ENCOUNTER — Telehealth: Payer: Self-pay | Admitting: "Endocrinology

## 2021-11-11 ENCOUNTER — Other Ambulatory Visit (INDEPENDENT_AMBULATORY_CARE_PROVIDER_SITE_OTHER): Payer: Self-pay | Admitting: "Endocrinology

## 2021-11-11 DIAGNOSIS — E782 Mixed hyperlipidemia: Secondary | ICD-10-CM

## 2021-11-11 MED ORDER — ROSUVASTATIN CALCIUM 20 MG PO TABS
20.0000 mg | ORAL_TABLET | Freq: Every day | ORAL | 1 refills | Status: DC
Start: 2021-11-11 — End: 2022-05-06

## 2021-11-11 NOTE — Telephone Encounter (Signed)
Pt called in regards to rx for Pitavastatin Magnesium 2 MG Tab. She states this medication is not covered by her insurance. Pt request call back to discuss alternative.

## 2021-11-11 NOTE — Telephone Encounter (Signed)
Patient has been informed.

## 2021-11-11 NOTE — Telephone Encounter (Signed)
Pitatvastatin not covered by insurance please send in alternative.

## 2021-11-12 ENCOUNTER — Other Ambulatory Visit (INDEPENDENT_AMBULATORY_CARE_PROVIDER_SITE_OTHER): Payer: Self-pay | Admitting: Family

## 2021-11-12 DIAGNOSIS — I10 Essential (primary) hypertension: Secondary | ICD-10-CM

## 2021-11-12 MED ORDER — METOPROLOL SUCCINATE ER 50 MG PO TB24
50.0000 mg | ORAL_TABLET | Freq: Every day | ORAL | 0 refills | Status: DC
Start: 2021-11-12 — End: 2022-02-12

## 2021-11-12 NOTE — Telephone Encounter (Signed)
Patient called to request refill of metoprolol.  She had appt in January.  She also wants it noted that her diabetic provider has her taking rosuvastatin 20 mg.  Thanks/sa

## 2021-11-28 ENCOUNTER — Other Ambulatory Visit (INDEPENDENT_AMBULATORY_CARE_PROVIDER_SITE_OTHER): Payer: Self-pay | Admitting: Family

## 2021-12-25 ENCOUNTER — Telehealth: Payer: Self-pay

## 2021-12-25 ENCOUNTER — Other Ambulatory Visit (INDEPENDENT_AMBULATORY_CARE_PROVIDER_SITE_OTHER): Payer: Self-pay | Admitting: "Endocrinology

## 2021-12-25 DIAGNOSIS — Z794 Long term (current) use of insulin: Secondary | ICD-10-CM

## 2021-12-25 MED ORDER — SEMAGLUTIDE (2 MG/DOSE) 8 MG/3ML SC SOPN
2.0000 mg | PEN_INJECTOR | SUBCUTANEOUS | 1 refills | Status: DC
Start: 2021-12-25 — End: 2022-06-08

## 2021-12-25 NOTE — Telephone Encounter (Signed)
Patient is asking for refills on her Ozempic 2mg  injection pen to be renewed at [facility], please review, rx on file has been exhausted    If approved please send to [facility] for filling, we have in stock! Thanks!

## 2022-01-08 ENCOUNTER — Telehealth (INDEPENDENT_AMBULATORY_CARE_PROVIDER_SITE_OTHER): Payer: Self-pay | Admitting: Family

## 2022-01-08 NOTE — Telephone Encounter (Signed)
Called pharmacy and they said they would fill 90 tab as they dont know why only 4 have been filled. Pt has been left a voicemail by the pharmacy and pick up their rx.

## 2022-01-08 NOTE — Telephone Encounter (Signed)
Patient states that she only receives 4 tabs of potassium when she gets refills.  Please call patient to clarify as it looks like it was rx'd for 90.  Thanks/sa

## 2022-01-08 NOTE — Telephone Encounter (Signed)
Called pharmacy to look into the issue, pharmacy's computer crashed so they will call back. Will follow up if no response. EJ

## 2022-01-08 NOTE — Telephone Encounter (Signed)
[facility] pharmacy called to verify Rx.    Pls. Cb [phone]

## 2022-01-11 NOTE — Telephone Encounter (Signed)
Called pt and she said she is going to pick up rx. Pt also stated she wanted a vein specialist so I advised her to make an appointment with RS for the referral. Pt expressed understanding. EJ

## 2022-02-09 ENCOUNTER — Other Ambulatory Visit (INDEPENDENT_AMBULATORY_CARE_PROVIDER_SITE_OTHER): Payer: Self-pay | Admitting: Family

## 2022-02-09 DIAGNOSIS — I10 Essential (primary) hypertension: Secondary | ICD-10-CM

## 2022-02-11 ENCOUNTER — Ambulatory Visit (INDEPENDENT_AMBULATORY_CARE_PROVIDER_SITE_OTHER): Payer: Medicare Other | Admitting: Family

## 2022-02-11 NOTE — Progress Notes (Signed)
[facility]                       Date of Virtual Visit: 02/12/2022 4:08 PM        Patient ID: Claudia Adams is a 76 y.o. female.  Attending Physician: Gaetano Hawthorne, DNP FNP       Telemedicine Eligibility:    State Location:  [x]  [place]  []  [place]  []  [place] []  [place]  []  Other:    Physical Location:  [x]  Home  []  Office  []  Other:    Patient Consent to Billing: Yes         Chief Complaint:    Chief Complaint   Patient presents with    Hypertension               HPI:    Pt presents for Chronic care:     Hypertension: Metoprolol 50mg  / Losartan - HCTZ 100-25mg    Average BPs at home: cannot recall   Medication compliance: daily as rx'd   Chest pain/pressure, shortness of breath? No   Exercise/Diet: good   Last micro:  Refill needed: No     Lab Results       Component                Value               Date                       CREAT                    1.2 (H)             11/09/2021                 CREAT                    1.2 (H)             07/27/2021                 EGFR                     52.9                11/09/2021                 EGFR                     53.0                07/27/2021                Pt would like Vein Specialist referral / Podiatrist referral                       Problem List:    Patient Active Problem List   Diagnosis    Rheumatoid arthritis    Diabetes mellitus    Essential hypertension    Hyperlipidemia    Hypothyroidism    Anxiety and depression    Primary localized osteoarthrosis of ankle and foot    Polyneuropathy due to type 2 diabetes mellitus    Stage 3a chronic kidney disease    Positive QuantiFERON-TB Gold test             Current Meds:  Outpatient Medications Marked as Taking for the 02/12/22 encounter (Telemedicine Visit) with Gaetano Hawthorne, DNP FNP   Medication Sig Dispense Refill    Accu-Chek SmartView test strip MONITER BLOOD GLUCOSE DAILY 100 strip 3    BD Pen Needle Nano 2nd Gen 32G X 4 MM Misc Use once daily as  directed. 100 each 1    folic acid (FOLVITE) 1 MG tablet TAKE 1 TABLET BY MOUTH EVERY DAY 90 tablet 0    hydroxychloroquine (PLAQUENIL) 200 MG tablet Take 1 tablet (200 mg) by mouth daily      inFLIXimab (REMICADE IV) every 28 days      levothyroxine (SYNTHROID) 25 MCG tablet TAKE 1 TABLET(25 MCG) BY MOUTH EVERY DAY AT 6 AM 90 tablet 1    Multiple Vitamins-Minerals (Womens 50+ Multi Vitamin/Min) Tab Take 1 tablet by mouth daily      rosuvastatin (CRESTOR) 20 MG tablet Take 1 tablet (20 mg) by mouth daily 90 tablet 1    Semaglutide, 2 MG/DOSE, 8 MG/3ML Solution Pen-injector Inject 2 mg into the skin once a week 9 mL 1    traMADol (ULTRAM) 50 MG tablet Take 1 tablet (50 mg) by mouth every 6 (six) hours as needed for Pain      [DISCONTINUED] losartan-hydrochlorothiazide (HYZAAR) 100-25 MG per tablet Take 1 tablet by mouth daily 90 tablet 1    [DISCONTINUED] metoprolol succinate XL (TOPROL-XL) 50 MG 24 hr tablet Take 1 tablet (50 mg) by mouth daily 90 tablet 0    [DISCONTINUED] potassium chloride (KLOR-CON M10) 10 MEQ CR tablet TAKE 1 TABLET(10 MEQ) BY MOUTH DAILY 90 tablet 1          Allergies:    Allergies   Allergen Reactions    Penicillin G      Reports allergic as a child               Past Surgical History:    Past Surgical History:   Procedure Laterality Date    KNEE SURGERY Right 11/30/2019    knee replacement by Dr. Harlene Salts    SHOULDER SURGERY Right 2019    Rotator cuff           Family History:    Family History   Problem Relation Age of Onset    No known problems Mother     No known problems Father            Social History:    Social History     Tobacco Use    Smoking status: Never    Smokeless tobacco: Never   Vaping Use    Vaping status: Never Used   Substance Use Topics    Alcohol use: Not Currently    Drug use: Yes     Types: Marijuana     Comment: Gummie edibles for pain and anxiety           The following sections were reviewed this encounter by the provider:   Tobacco  Allergies  Meds   Problems  Med Hx  Surg Hx  Fam Hx             Vital Signs:    Wt 63.5 kg (140 lb)   BMI 26.45 kg/m          ROS:    Review of Systems   Respiratory:  Negative for shortness of breath.    Cardiovascular:  Negative for chest pain.  Physical Exam:    Physical Exam   GENERAL APPEARANCE: well developed, well nourished, no acute distress  HEAD: normal appearance, atraumatic, no swelling of face  NECK/THYROID: full range of motion without pain  LUNGS/CHEST: unlabored, speaking in full sentences  NEURO: facial movement symmetric, normal speech, normal head movement, normal gait  PSYCH: alert and oriented, appropriate affect, appropriate mood, normal speech        Assessment/Plan:    1. Essential hypertension  - metoprolol succinate XL (TOPROL-XL) 50 MG 24 hr tablet; Take 1 tablet (50 mg) by mouth daily  Dispense: 90 tablet; Refill: 1  - losartan-hydrochlorothiazide (HYZAAR) 100-25 MG per tablet; Take 1 tablet by mouth daily  Dispense: 90 tablet; Refill: 1  -Well controlled, refill provided    2. Hypokalemia  - potassium chloride (KLOR-CON M10) 10 MEQ CR tablet; TAKE 1 TABLET(10 MEQ) BY MOUTH DAILY  Dispense: 90 tablet; Refill: 1  -K+ 3.8 11/2021    3. Insomnia, unspecified type  - traZODone (DESYREL) 50 MG tablet; Take 1 tablet (50 mg) by mouth nightly  Dispense: 90 tablet; Refill: 1  -Will start using again as she is having anxiety with world issues    4. Mixed hyperlipidemia  -on statin managed by Dr. Leonie Douglas  -LDL reviewed from 11/2021, normal    5. Bunion  - Referral to Podiatry - EXTERNAL; Future    6. Varicose veins of both lower extremities, unspecified whether complicated  - Ambulatory referral to Vascular Surgery; Future    7. Polyneuropathy due to type 2 diabetes mellitus  -Continue current medication regimen and follow ups    8. Stage 3a chronic kidney disease  -1.2 crt and GFR >50 11/2021/ Will CTM              Follow-up:    Return in about 6 months (around 08/14/2022) for Wellness Exam.          Gaetano Hawthorne, DNP FNP

## 2022-02-12 ENCOUNTER — Telehealth (INDEPENDENT_AMBULATORY_CARE_PROVIDER_SITE_OTHER): Payer: Medicare Other | Admitting: Family

## 2022-02-12 ENCOUNTER — Encounter (INDEPENDENT_AMBULATORY_CARE_PROVIDER_SITE_OTHER): Payer: Self-pay | Admitting: Family

## 2022-02-12 VITALS — Wt 140.0 lb

## 2022-02-12 DIAGNOSIS — M21619 Bunion of unspecified foot: Secondary | ICD-10-CM

## 2022-02-12 DIAGNOSIS — E782 Mixed hyperlipidemia: Secondary | ICD-10-CM

## 2022-02-12 DIAGNOSIS — E1142 Type 2 diabetes mellitus with diabetic polyneuropathy: Secondary | ICD-10-CM

## 2022-02-12 DIAGNOSIS — N1831 Chronic kidney disease, stage 3a: Secondary | ICD-10-CM

## 2022-02-12 DIAGNOSIS — I8393 Asymptomatic varicose veins of bilateral lower extremities: Secondary | ICD-10-CM

## 2022-02-12 DIAGNOSIS — G4700 Insomnia, unspecified: Secondary | ICD-10-CM

## 2022-02-12 DIAGNOSIS — E876 Hypokalemia: Secondary | ICD-10-CM

## 2022-02-12 DIAGNOSIS — I10 Essential (primary) hypertension: Secondary | ICD-10-CM

## 2022-02-12 MED ORDER — POTASSIUM CHLORIDE CRYS ER 10 MEQ PO TBCR
EXTENDED_RELEASE_TABLET | ORAL | 1 refills | Status: DC
Start: 2022-02-12 — End: 2022-08-16

## 2022-02-12 MED ORDER — TRAZODONE HCL 50 MG PO TABS
50.0000 mg | ORAL_TABLET | Freq: Every evening | ORAL | 1 refills | Status: DC
Start: 2022-02-12 — End: 2022-08-16

## 2022-02-12 MED ORDER — METOPROLOL SUCCINATE ER 50 MG PO TB24
50.0000 mg | ORAL_TABLET | Freq: Every day | ORAL | 1 refills | Status: DC
Start: 2022-02-12 — End: 2022-08-16

## 2022-02-12 MED ORDER — LOSARTAN POTASSIUM-HCTZ 100-25 MG PO TABS
1.0000 | ORAL_TABLET | Freq: Every day | ORAL | 1 refills | Status: DC
Start: 2022-02-12 — End: 2022-08-16

## 2022-02-12 NOTE — Patient Instructions (Signed)
Low-Salt Diet  This diet removes foods that are high in salt. It also limits the amount of salt you use when cooking. It is most often used for people with high blood pressure, edema (fluid retention), and kidney, liver, or heart disease.  Table salt contains the mineral sodium. Your body needs sodium to work normally. But too much sodium can make your health problems worse. Your healthcare provider is recommending a low-salt (also called low-sodium) diet for you. Your total daily allowance of salt is 1,500 to 2,300 milligrams (mg). It is less than 1 teaspoon of table salt. This means you can have only about 500 to 700 mg of sodium at each meal. People with certain health problems should limit salt intake to the lower end of the recommended range.    When you cook, don't add much salt. If you can cook without using salt, even better. Don't add salt to your food at the table.  When shopping, read food labels. Salt is often called sodium on the label. Choose foods that are salt-free, low salt, or very low salt. Note that foods with reduced salt may not lower your salt intake enough.    Beans, potatoes, and pasta  Ok: Dry beans, split peas, lentils, potatoes, rice, macaroni, pasta, spaghetti without added salt  Avoid: Potato chips, tortilla chips, and similar products  Breads and cereals  Ok: Low-sodium breads, rolls, cereals, and cakes; low-salt crackers, matzo crackers  Avoid: Salted crackers, pretzels, popcorn, French toast, pancakes, muffins  Dairy  Ok: Milk, chocolate milk, hot chocolate mix, low-salt cheeses, and yogurt  Avoid: Processed cheese and cheese spreads; Roquefort, Camembert, and cottage cheese; buttermilk, instant breakfast drink  Desserts  Ok: Ice cream, frozen yogurt, juice bars, gelatin, cookies and pies, sugar, honey, jelly, hard candy  Avoid: Most pies, cakes and cookies prepared or processed with salt; instant pudding  Drinks  Ok: Tea, coffee, fizzy (carbonated) drinks, juices  Avoid: Flavored  coffees, electrolyte replacement drinks, sports drinks  Meats  Ok: All fresh meat, fish, poultry, low-salt tuna, eggs, egg substitute  Avoid: Smoked, pickled, brine-cured, or salted meats and fish. This includes bacon, chipped beef, corned beef, hot dogs, deli meats, ham, kosher meats, salt pork, sausage, canned tuna, salted codfish, smoked salmon, herring, sardines, or anchovies.  Seasonings and spices  Ok: Most seasonings are okay. Good substitutes for salt include: fresh herb blends, hot sauce, lemon, garlic, curry, vinegar, dry mustard, parsley, cilantro, horseradish, tomato paste, regular margarine, mayonnaise, unsalted butter, cream cheese, vegetable oil, cream, low-salt salad dressing and gravy.  Avoid: Regular ketchup, relishes, pickles, soy sauce, teriyaki sauce, Worcestershire sauce, BBQ sauce, tartar sauce, meat tenderizer, chili sauce, regular gravy, regular salad dressing, salted butter  Soups  Ok: Low-salt soups and broths made with allowed foods  Avoid: Bouillon cubes, soups with smoked or salted meats, regular soup and broth  Vegetables  Ok: Most vegetables are okay; also low-salt tomato and vegetable juices  Avoid: Sauerkraut and other brine-soaked vegetables; pickles and other pickled vegetables; tomato juice, olives  [employer] last reviewed this educational content on 04/07/2015   2000-2020 [employer] [address], [place] [address]. All rights reserved. This information is not intended as a substitute for professional medical care. Always follow your healthcare professional's instructions.

## 2022-02-15 ENCOUNTER — Ambulatory Visit (INDEPENDENT_AMBULATORY_CARE_PROVIDER_SITE_OTHER): Payer: Medicare Other | Admitting: "Endocrinology

## 2022-02-15 ENCOUNTER — Encounter (INDEPENDENT_AMBULATORY_CARE_PROVIDER_SITE_OTHER): Payer: Self-pay | Admitting: "Endocrinology

## 2022-02-15 VITALS — BP 124/78 | HR 74 | Temp 97.8°F | Resp 13 | Ht 61.0 in | Wt 139.0 lb

## 2022-02-15 DIAGNOSIS — G629 Polyneuropathy, unspecified: Secondary | ICD-10-CM

## 2022-02-15 DIAGNOSIS — E782 Mixed hyperlipidemia: Secondary | ICD-10-CM

## 2022-02-15 DIAGNOSIS — Z794 Long term (current) use of insulin: Secondary | ICD-10-CM

## 2022-02-15 DIAGNOSIS — I10 Essential (primary) hypertension: Secondary | ICD-10-CM

## 2022-02-15 DIAGNOSIS — E039 Hypothyroidism, unspecified: Secondary | ICD-10-CM

## 2022-02-15 DIAGNOSIS — E1142 Type 2 diabetes mellitus with diabetic polyneuropathy: Secondary | ICD-10-CM

## 2022-02-15 LAB — HEMOGLOBIN A1C
Average Estimated Glucose: 131.2 mg/dL
Hemoglobin A1C: 6.2 % — ABNORMAL HIGH (ref 4.6–5.6)

## 2022-02-15 LAB — COMPREHENSIVE METABOLIC PANEL
ALT: 17 U/L (ref 0–55)
AST (SGOT): 30 U/L (ref 5–41)
Albumin/Globulin Ratio: 1.3 (ref 0.9–2.2)
Albumin: 3.7 g/dL (ref 3.5–5.0)
Alkaline Phosphatase: 62 U/L (ref 37–117)
Anion Gap: 16 — ABNORMAL HIGH (ref 5.0–15.0)
BUN: 14 mg/dL (ref 7.0–21.0)
Bilirubin, Total: 0.9 mg/dL (ref 0.2–1.2)
CO2: 22 mEq/L (ref 17–29)
Calcium: 9.1 mg/dL (ref 7.9–10.2)
Chloride: 103 mEq/L (ref 99–111)
Creatinine: 1.4 mg/dL — ABNORMAL HIGH (ref 0.4–1.0)
Globulin: 2.8 g/dL (ref 2.0–3.6)
Glucose: 312 mg/dL — ABNORMAL HIGH (ref 70–100)
Protein, Total: 6.5 g/dL (ref 6.0–8.3)
Sodium: 141 mEq/L (ref 135–145)
eGFR: 38.8 mL/min/{1.73_m2} — AB (ref >=60)

## 2022-02-15 LAB — HEMOLYSIS INDEX: Hemolysis Index: 58 Index — ABNORMAL HIGH (ref 0–24)

## 2022-02-15 LAB — LIPID PANEL
Cholesterol / HDL Ratio: 1.9 Index
Cholesterol: 129 mg/dL (ref 0–199)
HDL: 69 mg/dL (ref 40–9999)
LDL Calculated: 47 mg/dL (ref 0–99)
Triglycerides: 64 mg/dL (ref 34–149)
VLDL Calculated: 13 mg/dL (ref 10–40)

## 2022-02-15 LAB — MICROALBUMIN, RANDOM URINE
Urine Creatinine, Random: 187.7 mg/dL
Urine Microalbumin, Random: 14 ug/ml (ref 0.0–30.0)
Urine Microalbumin/Creatinine Ratio: 7 ug/mg (ref 0–30)

## 2022-02-15 LAB — TSH: TSH: 1.43 u[IU]/mL (ref 0.35–4.94)

## 2022-02-15 LAB — T4, FREE: T4 Free: 1 ng/dL (ref 0.69–1.48)

## 2022-02-15 MED ORDER — GABAPENTIN 300 MG PO CAPS
300.0000 mg | ORAL_CAPSULE | Freq: Three times a day (TID) | ORAL | 1 refills | Status: DC
Start: 2022-02-15 — End: 2022-05-12

## 2022-02-15 NOTE — Progress Notes (Signed)
Subjective:      Date: 02/15/2022 10:34 AM   Patient ID: Claudia Adams is a 76 y.o. female.    Chief Complaint:  Chief Complaint   Patient presents with    Type 2 diabetes mellitus with diabetic polyneuropathy, with       HPI  Visit type:  follow up   Type: Type II and insulin requiring  Evaluation:  diagnosed 20 years ago   Last follow-up:  11/09/21    Diabetes Medication Regimen: Ozempic 2 mg weekly   Medication effectiveness/adherence: working well and general adherence with medications  Medication side effects: none  Presenting symptoms at time of diagnosis: abnormal screening glucose     Prior medications: Metformin - d/c'd due to previously low kidney function     Interval events:   Clinically stable since last seen about 3 months ago   Has RA and OA which has been well controlled on current remicade  Did not need Prednisone over the past several months   She continues on Remicade  Has chronic fatigue and joint pain due to RA and OA       Current symptoms: tingling/numbness in extremities which has been stable on Gabapentin 300 mg TID; recent hair loss   Patient denies: no other symptoms including chest pain, shortness of breath, hypoglycemic episodes, polydipsia, polyuria, headaches, dizziness, or abdominal pain  Exercise: walking 34x daily, short walks, has been consistent with exercise regimen     Diet: moderate compliance with recommended diet;   Complications: diabetic peripheral neuropathy  Pertinent medical history: hyperlipidemia, hypertension, hypothyroidism   Home glucose readings:  reviewed glucose meter :  Fasting glucose: 130s-140s, occasionally 170s    A1c result: 5.7 (11/09/21)   A1c trend is decreasing and at goal  Microalbumin trend: No results found for: MICROALBUMIN  Microalbumin trend is negative  LDL trend:   Lab Results   Component Value Date    LDL 90 11/09/2021    LDL 92 07/27/2021    LDL 99 03/26/2021     LDL trend is at goal for patient's age   Additional concerns: Pt has stable  essential HTN with no evidence of CHF or proteinuria.  The patient is compliant with anti-hypertensive therapy and denies side effects to therapy.  Pt denies CP, SOB, dizziness, orthopnea, PND or edema.  Pt has hyperlipidemia and is compliant with lipid therapy.  Pt denies side effects of lipid therapy - specifically denies myalgia.  Pt has hypothyroidism - compliant with current regimen.  Denies side effects to therapy.  Denies any change in hair/nails/skin, constipation, unexplained wt changes.    Current thyroid hormone levels are normal  Clinically stable  On levothyroxine 25 mcg daily and reports good compliance with therapy.    History of obesity   Weight (03/26/21):  176 lbs  Weight (07/27/21) 159 lbs   Weight (11/09/21)      144 lbs   Weight (02/15/22)    139 lbs     Saw retina specialist in May 2022, did not note retinopathy     Problem List:  Patient Active Problem List   Diagnosis    Rheumatoid arthritis    Diabetes mellitus    Essential hypertension    Hyperlipidemia    Hypothyroidism    Anxiety and depression    Primary localized osteoarthrosis of ankle and foot    Polyneuropathy due to type 2 diabetes mellitus    Stage 3a chronic kidney disease    Positive QuantiFERON-TB Gold test  Current Medications:  Current Outpatient Medications   Medication Sig Dispense Refill    Accu-Chek SmartView test strip MONITER BLOOD GLUCOSE DAILY 100 strip 3    BD Pen Needle Nano 2nd Gen 32G X 4 MM Misc Use once daily as directed. 100 each 1    fluticasone (FLONASE) 50 MCG/ACT nasal spray 1 spray by Nasal route as needed for Allergies 18 mL 2    folic acid (FOLVITE) 1 MG tablet TAKE 1 TABLET BY MOUTH EVERY DAY 90 tablet 0    hydroxychloroquine (PLAQUENIL) 200 MG tablet Take 1 tablet (200 mg) by mouth daily      inFLIXimab (REMICADE IV) every 28 days      levothyroxine (SYNTHROID) 25 MCG tablet TAKE 1 TABLET(25 MCG) BY MOUTH EVERY DAY AT 6 AM 90 tablet 1    losartan-hydrochlorothiazide (HYZAAR) 100-25 MG per tablet Take 1  tablet by mouth daily 90 tablet 1    metoprolol succinate XL (TOPROL-XL) 50 MG 24 hr tablet Take 1 tablet (50 mg) by mouth daily 90 tablet 1    Multiple Vitamins-Minerals (Womens 50+ Multi Vitamin/Min) Tab Take 1 tablet by mouth daily      potassium chloride (KLOR-CON M10) 10 MEQ CR tablet TAKE 1 TABLET(10 MEQ) BY MOUTH DAILY 90 tablet 1    predniSONE (DELTASONE) 5 MG tablet  (Patient not taking: Reported on 02/12/2022)      rosuvastatin (CRESTOR) 20 MG tablet Take 1 tablet (20 mg) by mouth daily 90 tablet 1    Semaglutide, 2 MG/DOSE, 8 MG/3ML Solution Pen-injector Inject 2 mg into the skin once a week 9 mL 1    traMADol (ULTRAM) 50 MG tablet Take 1 tablet (50 mg) by mouth every 6 (six) hours as needed for Pain      traZODone (DESYREL) 50 MG tablet Take 1 tablet (50 mg) by mouth nightly 90 tablet 1     No current facility-administered medications for this visit.       Allergies:  Allergies   Allergen Reactions    Penicillin G      Reports allergic as a child         Past Medical History:  Past Medical History:   Diagnosis Date    Anxiety     Diabetes mellitus     Disorder of thyroid     Hypertension     Rheumatoid arthritis     Seasonal allergic rhinitis        Past Surgical History:  Past Surgical History:   Procedure Laterality Date    KNEE SURGERY Right 11/30/2019    knee replacement by Dr. Harlene Salts    SHOULDER SURGERY Right 2019    Rotator cuff       Family History:  Family History   Problem Relation Age of Onset    No known problems Mother     No known problems Father        Social History:  Social History     Socioeconomic History    Marital status: Widowed   Tobacco Use    Smoking status: Never    Smokeless tobacco: Never   Vaping Use    Vaping status: Never Used   Substance and Sexual Activity    Alcohol use: Not Currently    Drug use: Yes     Types: Marijuana     Comment: Gummie edibles for pain and anxiety        The following sections were reviewed this encounter by the provider:  Tobacco  Allergies   Meds         Vitals:  BP 124/78 (BP Site: Left arm, Patient Position: Sitting, Cuff Size: Medium)   Pulse 74   Temp 97.8 F (36.6 C) (Temporal)   Resp 13   Ht 1.549 m (5\' 1" )   Wt 63 kg (139 lb)   SpO2 98%   BMI 26.26 kg/m      ROS: A complete 12 point ROS was obtained. Pertinent positives and negatives as noted in HPI. All other systems are negative.    Physical Examination     General appearance: alert, appears stated age, cooperative and no distress  Eyes: conjunctivae/corneas clear. PERRL, EOM's intact.   Neck: no cervical lymphadenopathy, no carotid bruit,  supple, symmetrical, trachea midline   Thyroid:normal sized on inspection; non tender to palpation and no discrete nodules palpated  Lungs: clear to auscultation bilaterally  Heart: regular rate and rhythm, S1, S2 normal, no murmur, click, rub or gallop  Abdomen: soft, non-tender; bowel sounds normal; no masses,  no organomegaly  Extremities: extremities normal, atraumatic, no cyanosis or edema  Skin: Skin color, texture, turgor normal. No rashes or lesions  Neurologic: Alert and oriented X 3, normal strength and tone. Normal symmetric reflexes. No tremor      Assessment and Plan     1. Type 2 diabetes mellitus with diabetic polyneuropathy, with long-term current use of insulin  -   Lab Results   Component Value Date    HGBA1C 5.7 11/09/2021    HGBA1C 5.8 07/27/2021    HGBA1C 6.5 (H) 03/26/2021       Current  A1C is at goal (goal A1C <6.5%).  -  Continue current DM medication regimen.  -  Recommend home glucometer testing once a day (pre-breakfast or 2 hrs post-prandial).  -  continue carb controlled diet and current exercise regimen      - Urine Microalbumin Random  - Hemoglobin A1C  - Comprehensive metabolic panel  - Lipid panel    2. Mixed hyperlipidemia  - LDL at goal   - continue Rosuvastatin 20 mg daily     - Comprehensive metabolic panel  - Lipid panel    3. Essential hypertension  - well controlled   - continue Losartan-HCTZ 100-25 mg  daily     - Comprehensive metabolic panel    4. Acquired hypothyroidism  - euthyroid on current dose Levothyroxine 25 mcg daily     - TSH  - T4, free    5. Neuropathy  - well controlled     - continue gabapentin (NEURONTIN) 300 MG capsule; Take 1 capsule (300 mg) by mouth 3 (three) times daily  Dispense: 90 capsule; Refill: 1       RTC in 4 months     Bynum Bellows MSN FNP-BC  [facility]

## 2022-02-17 ENCOUNTER — Other Ambulatory Visit (INDEPENDENT_AMBULATORY_CARE_PROVIDER_SITE_OTHER): Payer: Self-pay | Admitting: "Endocrinology

## 2022-02-17 DIAGNOSIS — Z794 Long term (current) use of insulin: Secondary | ICD-10-CM

## 2022-02-17 NOTE — Progress Notes (Signed)
1. Metabolic panel shows significantly elevated blood sugar at 312, this might be inaccurate because the blood specimen was hemolyzed.  Pls continue to monitor blood sugar at home. Creatinine is elevated and kidney function is significantly low, again most likely due to hemolyzed blood specimen.  Pls recheck fasting BMP next week, pls make sure to drink plenty of water before and at day of the blood draw.  Will enter [facility] lab order.    2. A1C is 6.2 in the range of well controlled diabetes. Pls continue current diabetes regimen   3. Lipids are normal,pls continue Rosuvastatin 20 mg daily   4. Thyroid hormone levels are normal, pls continue Levothyroxine 25 mcg daily   5. Urine microalbumin is normal indicates NO evidence of kidney inflammation from the diabetes which is good.

## 2022-02-22 ENCOUNTER — Other Ambulatory Visit (FREE_STANDING_LABORATORY_FACILITY): Payer: Medicare Other

## 2022-02-22 DIAGNOSIS — Z794 Long term (current) use of insulin: Secondary | ICD-10-CM

## 2022-02-22 DIAGNOSIS — E1142 Type 2 diabetes mellitus with diabetic polyneuropathy: Secondary | ICD-10-CM

## 2022-02-22 LAB — BASIC METABOLIC PANEL
Anion Gap: 9 (ref 5.0–15.0)
BUN: 12 mg/dL (ref 7.0–21.0)
CO2: 29 mEq/L (ref 17–29)
Calcium: 8.8 mg/dL (ref 7.9–10.2)
Chloride: 103 mEq/L (ref 99–111)
Creatinine: 1 mg/dL (ref 0.4–1.0)
Glucose: 150 mg/dL — ABNORMAL HIGH (ref 70–100)
Potassium: 3.5 mEq/L (ref 3.5–5.3)
Sodium: 141 mEq/L (ref 135–145)
eGFR: 58.2 mL/min/{1.73_m2} — AB (ref >=60)

## 2022-02-22 LAB — HEMOLYSIS INDEX: Hemolysis Index: 5 Index (ref 0–24)

## 2022-03-08 ENCOUNTER — Other Ambulatory Visit (INDEPENDENT_AMBULATORY_CARE_PROVIDER_SITE_OTHER): Payer: Self-pay | Admitting: Physician Assistant

## 2022-03-08 ENCOUNTER — Other Ambulatory Visit (INDEPENDENT_AMBULATORY_CARE_PROVIDER_SITE_OTHER): Payer: Self-pay | Admitting: Family

## 2022-03-10 ENCOUNTER — Other Ambulatory Visit (INDEPENDENT_AMBULATORY_CARE_PROVIDER_SITE_OTHER): Payer: Self-pay

## 2022-03-10 DIAGNOSIS — E039 Hypothyroidism, unspecified: Secondary | ICD-10-CM

## 2022-03-10 NOTE — Telephone Encounter (Signed)
Rx request received :    Pharmacy : [facility] [address]   Medication: Levothyroxine 25 mcg   Last visit: 02/15/22  Upcoming visit : 07/01/22    Medication clarification :

## 2022-03-12 MED ORDER — LEVOTHYROXINE SODIUM 25 MCG PO TABS
25.0000 ug | ORAL_TABLET | Freq: Every day | ORAL | 1 refills | Status: DC
Start: 2022-03-12 — End: 2022-08-16

## 2022-04-06 ENCOUNTER — Other Ambulatory Visit (INDEPENDENT_AMBULATORY_CARE_PROVIDER_SITE_OTHER): Payer: Self-pay | Admitting: Sports Medicine"

## 2022-04-16 ENCOUNTER — Telehealth (INDEPENDENT_AMBULATORY_CARE_PROVIDER_SITE_OTHER): Payer: Self-pay | Admitting: Family

## 2022-04-16 NOTE — Telephone Encounter (Signed)
Date of Surgery: 05/17/22    Type of surgery: total shoulder replacement    Surgeon's name and Practice Information:  Dr. Trellis [facility]    Surgery Coordinator's name:    Phone number and Fax number (of Surgeon or Coordinator):  Ph: [phone]  Fax: [phone]    Type of tests required (i.e. EKG, CBC, BMP, PT/INR, etc):    Patient's PCP: Gaetano Hawthorne

## 2022-04-20 NOTE — Telephone Encounter (Signed)
Scheduled. Left VM to surgery coordinator requesting order. -YR

## 2022-04-21 ENCOUNTER — Encounter (INDEPENDENT_AMBULATORY_CARE_PROVIDER_SITE_OTHER): Payer: Self-pay | Admitting: Family

## 2022-04-21 NOTE — Telephone Encounter (Signed)
Received and scanned pre op clearance request into chart. -cm

## 2022-04-22 NOTE — Progress Notes (Unsigned)
[facility] [place] [facility]                       Date of Exam: 04/26/2022 2:35 PM        Patient ID: Claudia Adams is a 76 y.o. female.  Attending Physician: Constance Holster, NP        Chief Complaint:    Chief Complaint   Patient presents with    Pre-op Exam               HPI:    Pt presents for preoperative exam.    Procedure:  Reverse Shoulder Arthroplasty - right   Date of Surgery:  l9/11/23  Surgeon:  Leeroy Cha MD   Fax Number:  [phone]  Chief Complaint:  Shoulder pain   Prior Anesthesia:  No past reactions   Tests needed:  H&P, CBC, CMP, PT/INR/, APTT, Nasal MRSA Culture, EKG, CXR               Problem List:    Patient Active Problem List   Diagnosis    Rheumatoid arthritis    Diabetes mellitus    Essential hypertension    Hyperlipidemia    Hypothyroidism    Anxiety and depression    Primary localized osteoarthrosis of ankle and foot    Polyneuropathy due to type 2 diabetes mellitus    Stage 3a chronic kidney disease    Positive QuantiFERON-TB Gold test             Current Meds:    No outpatient medications have been marked as taking for the 04/26/22 encounter (Office Visit) with Constance Holster, NP.          Allergies:    Allergies   Allergen Reactions    Penicillin G      Reports allergic as a child               Past Surgical History:    Past Surgical History:   Procedure Laterality Date    KNEE SURGERY Right 11/30/2019    knee replacement by Dr. Harlene Salts    SHOULDER SURGERY Right 2019    Rotator cuff           Family History:    Family History   Problem Relation Age of Onset    No known problems Mother     No known problems Father            Social History:    Social History     Tobacco Use    Smoking status: Never    Smokeless tobacco: Never   Vaping Use    Vaping Use: Never used   Substance Use Topics    Alcohol use: Not Currently    Drug use: Yes     Types: Marijuana     Comment: Gummie edibles for pain and anxiety           The following sections  were reviewed this encounter by the provider:            Vital Signs:    There were no vitals taken for this visit.         ROS:    Review of Systems           Physical Exam:    Physical Exam         Assessment/Plan:    1. Pre-op examination  Follow-up:    No follow-ups on file.         Queen Blossom, NP

## 2022-04-26 ENCOUNTER — Ambulatory Visit (INDEPENDENT_AMBULATORY_CARE_PROVIDER_SITE_OTHER): Payer: Medicare Other

## 2022-04-26 ENCOUNTER — Encounter (INDEPENDENT_AMBULATORY_CARE_PROVIDER_SITE_OTHER): Payer: Self-pay

## 2022-04-26 VITALS — BP 132/84 | HR 81 | Temp 98.1°F

## 2022-04-26 DIAGNOSIS — G8929 Other chronic pain: Secondary | ICD-10-CM

## 2022-04-26 DIAGNOSIS — M25511 Pain in right shoulder: Secondary | ICD-10-CM

## 2022-04-26 DIAGNOSIS — E039 Hypothyroidism, unspecified: Secondary | ICD-10-CM

## 2022-04-26 DIAGNOSIS — Z01810 Encounter for preprocedural cardiovascular examination: Secondary | ICD-10-CM

## 2022-04-26 DIAGNOSIS — Z01818 Encounter for other preprocedural examination: Secondary | ICD-10-CM

## 2022-04-26 DIAGNOSIS — E11618 Type 2 diabetes mellitus with other diabetic arthropathy: Secondary | ICD-10-CM

## 2022-04-26 DIAGNOSIS — I10 Essential (primary) hypertension: Secondary | ICD-10-CM

## 2022-04-26 DIAGNOSIS — N1831 Chronic kidney disease, stage 3a: Secondary | ICD-10-CM

## 2022-04-26 LAB — CBC AND DIFFERENTIAL
Absolute NRBC: 0 10*3/uL (ref 0.00–0.00)
Basophils Absolute Automated: 0.04 10*3/uL (ref 0.00–0.08)
Basophils Automated: 0.6 %
Eosinophils Absolute Automated: 0.18 10*3/uL (ref 0.00–0.44)
Eosinophils Automated: 2.7 %
Hematocrit: 32 % — ABNORMAL LOW (ref 34.7–43.7)
Hgb: 11 g/dL — ABNORMAL LOW (ref 11.4–14.8)
Immature Granulocytes Absolute: 0.01 10*3/uL (ref 0.00–0.07)
Immature Granulocytes: 0.2 %
Instrument Absolute Neutrophil Count: 3.39 10*3/uL (ref 1.10–6.33)
Lymphocytes Absolute Automated: 2.45 10*3/uL (ref 0.42–3.22)
Lymphocytes Automated: 37.2 %
MCH: 31.5 pg (ref 25.1–33.5)
MCHC: 34.4 g/dL (ref 31.5–35.8)
MCV: 91.7 fL (ref 78.0–96.0)
MPV: 11.1 fL (ref 8.9–12.5)
Monocytes Absolute Automated: 0.51 10*3/uL (ref 0.21–0.85)
Monocytes: 7.8 %
Neutrophils Absolute: 3.39 10*3/uL (ref 1.10–6.33)
Neutrophils: 51.5 %
Nucleated RBC: 0 /100 WBC (ref 0.0–0.0)
Platelets: 156 10*3/uL (ref 142–346)
RBC: 3.49 10*6/uL — ABNORMAL LOW (ref 3.90–5.10)
RDW: 13 % (ref 11–15)
WBC: 6.58 10*3/uL (ref 3.10–9.50)

## 2022-04-26 LAB — COMPREHENSIVE METABOLIC PANEL
ALT: 15 U/L (ref 0–55)
AST (SGOT): 24 U/L (ref 5–41)
Albumin/Globulin Ratio: 1.6 (ref 0.9–2.2)
Albumin: 3.9 g/dL (ref 3.5–5.0)
Alkaline Phosphatase: 57 U/L (ref 37–117)
Anion Gap: 7 (ref 5.0–15.0)
BUN: 16 mg/dL (ref 7.0–21.0)
Bilirubin, Total: 0.8 mg/dL (ref 0.2–1.2)
CO2: 32 mEq/L — ABNORMAL HIGH (ref 17–29)
Calcium: 9.1 mg/dL (ref 7.9–10.2)
Chloride: 103 mEq/L (ref 99–111)
Creatinine: 1.2 mg/dL — ABNORMAL HIGH (ref 0.4–1.0)
Globulin: 2.5 g/dL (ref 2.0–3.6)
Glucose: 190 mg/dL — ABNORMAL HIGH (ref 70–100)
Potassium: 3.8 mEq/L (ref 3.5–5.3)
Protein, Total: 6.4 g/dL (ref 6.0–8.3)
Sodium: 142 mEq/L (ref 135–145)
eGFR: 46.7 mL/min/{1.73_m2} — AB (ref >=60)

## 2022-04-26 LAB — ECG 12-LEAD
Atrial Rate: 72 {beats}/min
P Axis: 17 degrees
P-R Interval: 154 ms
Q-T Interval: 414 ms
QRS Duration: 100 ms
QTC Calculation (Bezet): 453 ms
R Axis: -19 degrees
T Axis: 59 degrees
Ventricular Rate: 72 {beats}/min

## 2022-04-26 LAB — HEMOLYSIS INDEX: Hemolysis Index: 9 Index (ref 0–24)

## 2022-04-27 ENCOUNTER — Telehealth: Payer: Self-pay | Admitting: "Endocrinology

## 2022-04-27 ENCOUNTER — Encounter (INDEPENDENT_AMBULATORY_CARE_PROVIDER_SITE_OTHER): Payer: Self-pay

## 2022-04-27 LAB — APTT: PTT: 24 s — ABNORMAL LOW (ref 27–39)

## 2022-04-27 LAB — PT/INR
PT INR: 1 (ref 0.9–1.1)
PT: 11.4 s (ref 10.1–12.9)

## 2022-04-27 LAB — MRSA CULTURE: Culture MRSA Surveillance: NEGATIVE

## 2022-04-27 NOTE — Telephone Encounter (Signed)
Pt is calling to get new RX for Accuchek Guide Test Strips    Please send to the following Pharmacy:    [facility] #[id number] [place], [place] - [address]

## 2022-04-27 NOTE — Telephone Encounter (Signed)
Rx was sent last month by pcp for 90 days with 3 additional refills     Outpatient Medication Detail     Disp Refills Start End    Accu-Chek SmartView test strip 100 strip 3 03/12/2022     Sig: USE DAILY    Sent to pharmacy as: Accu-Chek SmartView test strip    Class: E-Rx    E-Prescribing Status: Receipt confirmed by pharmacy (03/12/2022 10:11 AM EDT)    Renewals    Renewal provider: Larna Daughters, PA         Pharmacy    [facility] [phone] - [place], [place] - [address] AT [address]

## 2022-04-28 ENCOUNTER — Encounter (INDEPENDENT_AMBULATORY_CARE_PROVIDER_SITE_OTHER): Payer: Self-pay

## 2022-04-28 NOTE — Progress Notes (Signed)
Hi Claudia Adams:    Blood coagulation time is mostly within range. Slight anemia noted, most likely due to your chronic kidney issue. Kidney function remains stable. Liver functions within normal. Nasal culture is negative for methicillin resistant staph aureus.   Chest xray result did not show any acute cardia or pulmonary condition.   I will forward your pre-op labs to surgeon. Please follow up with surgeon's office in regard to medication usage on the day of surgery.     Regards,     Constance Holster, AGNP

## 2022-05-05 ENCOUNTER — Telehealth: Payer: Self-pay | Admitting: Registered"

## 2022-05-05 DIAGNOSIS — G629 Polyneuropathy, unspecified: Secondary | ICD-10-CM

## 2022-05-05 NOTE — Telephone Encounter (Addendum)
Last OV: 02/15/22  Next OV: 07/01/22    Outpatient Medication Detail     Disp Refills Start End    gabapentin (NEURONTIN) 300 MG capsule 90 capsule 1 02/15/2022     Sig - Route: Take 1 capsule (300 mg) by mouth 3 (three) times daily - Oral    Sent to pharmacy as: gabapentin (NEURONTIN) 300 MG capsule    Class: E-Rx    E-Prescribing Status: Receipt confirmed by pharmacy (02/15/2022 11:01 AM EDT)    Pharmacy    [facility] [phone] - [place], [place] - [address]

## 2022-05-05 NOTE — Telephone Encounter (Signed)
Name, strength, directions of requested refill(s):      How much medication is remaining: 4 pills    Pharmacy to send refill to or patient to pick up rx from office (mark requested pharmacy in BOLD):      [facility] #[id number] [place], [place] - [address] AT [facility] [address]  [address]  [place] [address]  Phone: [phone] Fax: [phone]        Please mark "X" next to the preferred call back number:    Mobile: [phone] (mobile)    Home: [phone] (home)    Work: @WORKPHONE @        Medication refill request, see above. Thank you   Patient has been informed that medication refill requests should be called in up to one week prior to running out of medication.    Additional Notes:  Next Visit: 07/01/22

## 2022-05-06 ENCOUNTER — Other Ambulatory Visit (INDEPENDENT_AMBULATORY_CARE_PROVIDER_SITE_OTHER): Payer: Self-pay

## 2022-05-06 DIAGNOSIS — E782 Mixed hyperlipidemia: Secondary | ICD-10-CM

## 2022-05-06 MED ORDER — ROSUVASTATIN CALCIUM 20 MG PO TABS
20.0000 mg | ORAL_TABLET | Freq: Every day | ORAL | 0 refills | Status: DC
Start: 2022-05-06 — End: 2022-08-16

## 2022-05-06 NOTE — Telephone Encounter (Signed)
UV 07/01/22  LV 02/15/22    Rx request for Rosuvastatin 20 mg to be sent to [facility].

## 2022-05-12 ENCOUNTER — Other Ambulatory Visit (INDEPENDENT_AMBULATORY_CARE_PROVIDER_SITE_OTHER): Payer: Self-pay

## 2022-05-12 DIAGNOSIS — G629 Polyneuropathy, unspecified: Secondary | ICD-10-CM

## 2022-05-12 MED ORDER — GABAPENTIN 300 MG PO CAPS
300.0000 mg | ORAL_CAPSULE | Freq: Three times a day (TID) | ORAL | 0 refills | Status: DC
Start: 2022-05-12 — End: 2022-06-18

## 2022-05-12 NOTE — Telephone Encounter (Signed)
Pt is calling to know the status of her medication. She has called her Pharmacy and they haven't received the RX. The pt has been out of her medication for about a week and the pt is starting to feel the affects without it. Can someone please send the RX

## 2022-05-12 NOTE — Telephone Encounter (Signed)
Rx request received :    Pharmacy :  [facility] [address]  Medication: Gabapentin 300 mg Cap   Last visit: 02/15/22  Upcoming visit : 07/01/22    Medication clarification :

## 2022-05-12 NOTE — Telephone Encounter (Signed)
Rx refill(s) sent.

## 2022-05-31 ENCOUNTER — Other Ambulatory Visit (INDEPENDENT_AMBULATORY_CARE_PROVIDER_SITE_OTHER): Payer: Self-pay | Admitting: Family

## 2022-06-06 HISTORY — PX: OTHER SURGICAL HISTORY: SHX169

## 2022-06-08 ENCOUNTER — Other Ambulatory Visit (INDEPENDENT_AMBULATORY_CARE_PROVIDER_SITE_OTHER): Payer: Self-pay

## 2022-06-08 DIAGNOSIS — E1142 Type 2 diabetes mellitus with diabetic polyneuropathy: Secondary | ICD-10-CM

## 2022-06-08 NOTE — Telephone Encounter (Signed)
Rx request received :    Pharmacy :  [facility]   Medication: Ozempic 2 mg   Last visit:  02/15/22  Upcoming visit : 07/01/22    Medication clarification :

## 2022-06-10 MED ORDER — SEMAGLUTIDE (2 MG/DOSE) 8 MG/3ML SC SOPN
2.0000 mg | PEN_INJECTOR | SUBCUTANEOUS | 1 refills | Status: DC
Start: 2022-06-10 — End: 2022-06-17

## 2022-06-14 ENCOUNTER — Other Ambulatory Visit: Payer: Self-pay | Admitting: "Endocrinology

## 2022-06-14 ENCOUNTER — Other Ambulatory Visit: Payer: Self-pay

## 2022-06-14 DIAGNOSIS — M13811 Other specified arthritis, right shoulder: Secondary | ICD-10-CM

## 2022-06-14 DIAGNOSIS — G629 Polyneuropathy, unspecified: Secondary | ICD-10-CM

## 2022-06-14 MED ORDER — ACCU-CHEK SMARTVIEW VI STRP
ORAL_STRIP | 3 refills | Status: DC
Start: 2022-06-14 — End: 2022-07-02

## 2022-06-14 NOTE — Telephone Encounter (Signed)
Gabapentin 90day refill sent on 05/12/22

## 2022-06-14 NOTE — Telephone Encounter (Signed)
Pt requesting to speak to the nurse about her medication. Pt says [facility] does not have her ozempic in stock. Pt would like to know it'll be ok to not take the medication with a procedure coming up. Please contact pt

## 2022-06-14 NOTE — Telephone Encounter (Signed)
Name, strength, directions of requested refill(s):  PATIENT IS OUT OF TEST STRIPS    Accu-Chek SmartView test strip      gabapentin (NEURONTIN) 300 MG capsule    How much medication is remaining:     Pharmacy to send refill to or patient to pick up rx from office (mark requested pharmacy in BOLD):      [facility] #[id number] [place], [place] - [address] AT [facility] [address]  [address]  [facility] [place] [address]  Phone: [phone] Fax: [phone]        Please mark "X" next to the preferred call back number:    Mobile: [phone] (mobile)    Home: [phone] (home)    Work: @WORKPHONE @        Medication refill request, see above. Thank you   Patient has been informed that medication refill requests should be called in up to one week prior to running out of medication.    Additional Notes:  Next Visit: MM/DD/YY

## 2022-06-14 NOTE — Progress Notes (Signed)
Date of Exam: 06/17/2022 2:25 PM        Patient ID: Claudia Adams is a 76 y.o. female.  Attending Physician: Constance Holster, NP        Chief Complaint:    Chief Complaint   Patient presents with    Pre-op Exam               HPI:    HPI  Surgery is to be performed at [facility].   Previous date of surgery cancelled due to was canceled due to privilege issue on surgeon's part.   Visit Type: Pre-operative Evaluation  Procedure: ARTHROPLASTY, SHOULDER REVERSE   Date of Surgery: 07/01/2022  Surgeon: Leeroy Cha, MD  Fax Number (Required): [phone]  Chief Complaint: Other specified arthritis, right shoulder  Exercise Tolerance: Can exercise, walking, stairs, no difficulty  Surgical Risk Factors: No cardiovascular, lung, or bleeding risk  Prior Anesthesia: no prior problems with anesthesia    Pt just needs repeat bloodwork for today.     Pt was seen 04/26/22 to do pre-op clearance labs: CBC, CMP, PT/aPTT/INR, Mrsa swab, EKG, and chest xr. Notes on results are as follows:   "Blood coagulation time is mostly within range. Slight anemia noted, most likely due to your chronic kidney issue. Kidney function remains stable. Liver functions within normal. Nasal culture is negative for methicillin resistant staph aureus.   Chest xray result did not show any acute cardia or pulmonary condition.   I will forward your pre-op labs to surgeon. Please follow up with surgeon's office in regard to medication usage on the day of surgery"          Problem List:    Patient Active Problem List   Diagnosis    Rheumatoid arthritis    Diabetes mellitus    Essential hypertension    Hyperlipidemia    Hypothyroidism    Anxiety and depression    Primary localized osteoarthrosis of ankle and foot    Polyneuropathy due to type 2 diabetes mellitus    Stage 3a chronic kidney disease    Positive QuantiFERON-TB Gold test    Other specified arthritis, right shoulder             Current Meds:    Outpatient Medications Marked as  Taking for the 06/17/22 encounter (Office Visit) with Constance Holster, NP   Medication Sig Dispense Refill    Accu-Chek SmartView test strip USE DAILY 100 strip 3    folic acid (FOLVITE) 1 MG tablet TAKE 1 TABLET BY MOUTH EVERY DAY 90 tablet 3    gabapentin (NEURONTIN) 300 MG capsule Take 1 capsule (300 mg) by mouth 3 (three) times daily 90 capsule 0    hydroxychloroquine (PLAQUENIL) 200 MG tablet Take 1 tablet (200 mg) by mouth daily      levothyroxine (SYNTHROID) 25 MCG tablet Take 1 tablet (25 mcg) by mouth Once a day at 6:00am 90 tablet 1    losartan-hydrochlorothiazide (HYZAAR) 100-25 MG per tablet Take 1 tablet by mouth daily 90 tablet 1    methotrexate 2.5 MG tablet Take by mouth once a week Takes 6 tablets weekly      metoprolol succinate XL (TOPROL-XL) 50 MG 24 hr tablet Take 1 tablet (50 mg) by mouth daily 90 tablet 1    Multiple Vitamins-Minerals (Womens 50+ Multi Vitamin/Min) Tab Take 1 tablet by mouth daily      potassium chloride (KLOR-CON M10) 10 MEQ CR tablet TAKE 1 TABLET(10 MEQ) BY MOUTH DAILY 90  tablet 1    rosuvastatin (CRESTOR) 20 MG tablet Take 1 tablet (20 mg) by mouth daily 90 tablet 0    Semaglutide, 1 MG/DOSE, (Ozempic, 1 MG/DOSE,) 4 MG/3ML Solution Pen-injector Inject 1 mg into the skin once a week 3 mL 0    traMADol (ULTRAM) 50 MG tablet Take 1 tablet (50 mg) by mouth every 6 (six) hours as needed for Pain      traZODone (DESYREL) 50 MG tablet Take 1 tablet (50 mg) by mouth nightly 90 tablet 1    [DISCONTINUED] inFLIXimab (REMICADE IV) every 28 days            Allergies:    Allergies   Allergen Reactions    Penicillin G      Reports allergic as a child               Past Surgical History:    Past Surgical History:   Procedure Laterality Date    KNEE SURGERY Right 11/30/2019    knee replacement by Dr. Harlene Salts    SHOULDER SURGERY Right 2019    Rotator cuff           Family History:    Family History   Problem Relation Age of Onset    No known problems Mother     No known problems Father             Social History:    Social History     Tobacco Use    Smoking status: Never    Smokeless tobacco: Never   Vaping Use    Vaping Use: Never used   Substance Use Topics    Alcohol use: Not Currently    Drug use: Yes     Types: Marijuana     Comment: Gummie edibles for pain and anxiety           The following sections were reviewed this encounter by the provider:            Vital Signs:    BP 130/62   Pulse 79   Temp 97.9 F (36.6 C)   Wt 60.8 kg (134 lb)   SpO2 99%   BMI 25.32 kg/m          ROS:    Review of Systems   General/Constitutional:           Denies Chills.  Denies Fatigue.  Denies Fever.       Ophthalmologic:           Denies Blurred vision.       ENT:           Denies Nasal Discharge.  Denies Sinus pain.  Denies Sore throat.       Endocrine:           Denies Polydipsia.  Denies Polyuria.       Respiratory:           Denies Cough.  Denies Orthopnea.  Denies Shortness of breath.  Denies Wheezing.       Cardiovascular:           Denies Chest pain.  Denies Chest pain with exertion.   Gastrointestinal:           Denies Abdominal pain.nDenies Nausea.  Denies Vomiting.       Hematology:           Denies Easy bruising.  Denies Easy Bleeding.       Genitourinary:  Denies Blood in urine.  Denies Painful urination.       Peripheral Vascular:           Denies Pain/cramping in legs after exertion.       Skin:           Denies new Rash.                          Physical Exam:    Physical Exam   GENERAL APPEARANCE: alert, in no acute distress, well developed, well nourished, oriented to time, place, and person.   HEAD: normal appearance.   EYES: extraocular movement intact (EOMI), pupils equal, round, reactive to light and accommodation, sclera anicteric.   EARS: tympanic membranes normal bilaterally, external canals normal .   NECK/THYROID: neck supple, carotid pulse 2+ bilaterally, no lymphadenopathy, no   SKIN: no rashes.   HEART: S1, S2 normal, no murmurs, rubs, gallops, regular rate and  rhythm.   LUNGS: normal effort / no distress, normal breath sounds, clear to auscultation bilaterally, no wheezes, rales, rhonchi.   EXTREMITIES: no clubbing, cyanosis, or edema. Marland Kitchen   NEUROLOGIC: nonfocal, cranial nerves 2-12 grossly intact, deep tendon reflexes 2+ symmetrical, normal strength, tone and reflexes, sensory exam intact.   PSYCH: alert, oriented, cognitive function intact.        Assessment:    1. Pre-op examination    2. Rheumatoid arthritis involving multiple sites, unspecified whether rheumatoid factor present    3. Essential hypertension    4. Hypothyroidism, unspecified type    5. Type 2 diabetes mellitus with other diabetic arthropathy, without long-term current use of insulin            Plan:    1.  Repeat blood work today.    2.  Stable.   Manages by rheumatologist.   On daily plaquenil.   Currently off Remicade.  Hold plaquenil the day before surgery.     3.  Stable.   BP today: 130/62.  Continue with metoprolol XL 50 mg daily.   Continue with hyzaar 100/25 daily.   Hold hyzaar the day before surgery.   Take metoprolol XL the day of surgery with a small sip of water.     4  Stable.   Manages by endo  Last TSH/T4: 1.43/1.0 form 02/15/22  Continue with levothyroxine 25 mcg daily  Hold the day before.     5.  Stable.   Manages by endo  Last HgA1C from 02/15/22: 6.2  Continue with Ozempic 1 mg/wk; recently decreased from 2 mg to 1 mg.   Instructed to hold 1 wk prior to surgery.     Pre-Operative Evaluation:  Preoperative Evaluation:  Surgery Specific Risk:  Intermediate (carotid, intraperitoneal, intrathoracic, prostate, major abdominal, major orthopedic, major ENT)  ASA Classification: Class 3 - Severe systemic disease, limits normal activity.  EKG Findings:   EKG from 04/26/22: NSR with occasional PVC  Preoperative Status:  Exercise tolerance is greater than 4 mets.  Hypertension is controlled on the current regimen. Diabetes is controlled on the current regimen.  A1C is at goal. CKD - renal indices  are stable on current medication regimen.   Revised Cardiac Risk Index:  - No high risk surgery  - No history of Ischemic heart disease  - No history of CHF  - No history of stroke or TIA   - No history of DM requiring insulin  - Renal function: serum creatinine <2  ---------------Scoring---------------------  Very low risk of major cardiac event (0 points)  Recommendations:  Herbal supplements - Pt advised to stop all herbals and supplements at least one week before surgery.  Pt is at low risk for peri-operative cardiovascular complications during planned intermediate risk surgical procedure.          Follow-up:    Return as needed.         Constance Holster, NP

## 2022-06-14 NOTE — Telephone Encounter (Signed)
Spoke to patient she states her last Ozempic dose was on 06/06/22 and she did not take it this past Sunday due to running out. She said the [facility] will not have more Ozempic in stock until next week. Patient has surgery scheduled for shoulder replacement on 07/01/22. Patient asked if it is okay to go without a week of her Ozempic . I did inform patient she should be fine until next week.

## 2022-06-14 NOTE — Addendum Note (Signed)
Addended by: Jerral Ralph ANN on: 06/14/2022 01:22 PM     Modules accepted: Orders

## 2022-06-15 ENCOUNTER — Other Ambulatory Visit (INDEPENDENT_AMBULATORY_CARE_PROVIDER_SITE_OTHER): Payer: Self-pay | Admitting: "Endocrinology

## 2022-06-15 MED ORDER — OZEMPIC (1 MG/DOSE) 4 MG/3ML SC SOPN: 1.0000 mg | PEN_INJECTOR | SUBCUTANEOUS | 0 refills | Status: DC

## 2022-06-16 NOTE — Telephone Encounter (Signed)
Reviewed recommendations patient verbalized understanding.

## 2022-06-17 ENCOUNTER — Ambulatory Visit (INDEPENDENT_AMBULATORY_CARE_PROVIDER_SITE_OTHER): Payer: Medicare Other

## 2022-06-17 ENCOUNTER — Encounter (INDEPENDENT_AMBULATORY_CARE_PROVIDER_SITE_OTHER): Payer: Self-pay

## 2022-06-17 VITALS — BP 130/62 | HR 79 | Temp 97.9°F | Wt 134.0 lb

## 2022-06-17 DIAGNOSIS — I10 Essential (primary) hypertension: Secondary | ICD-10-CM

## 2022-06-17 DIAGNOSIS — M069 Rheumatoid arthritis, unspecified: Secondary | ICD-10-CM

## 2022-06-17 DIAGNOSIS — E039 Hypothyroidism, unspecified: Secondary | ICD-10-CM

## 2022-06-17 DIAGNOSIS — Z01818 Encounter for other preprocedural examination: Secondary | ICD-10-CM

## 2022-06-17 DIAGNOSIS — E11618 Type 2 diabetes mellitus with other diabetic arthropathy: Secondary | ICD-10-CM

## 2022-06-17 LAB — CBC AND DIFFERENTIAL
Absolute NRBC: 0 10*3/uL (ref 0.00–0.00)
Basophils Absolute Automated: 0.03 10*3/uL (ref 0.00–0.08)
Basophils Automated: 0.6 %
Eosinophils Absolute Automated: 0.15 10*3/uL (ref 0.00–0.44)
Eosinophils Automated: 2.8 %
Hematocrit: 33 % — ABNORMAL LOW (ref 34.7–43.7)
Hgb: 11.2 g/dL — ABNORMAL LOW (ref 11.4–14.8)
Immature Granulocytes Absolute: 0.01 10*3/uL (ref 0.00–0.07)
Immature Granulocytes: 0.2 %
Instrument Absolute Neutrophil Count: 3.08 10*3/uL (ref 1.10–6.33)
Lymphocytes Absolute Automated: 1.83 10*3/uL (ref 0.42–3.22)
Lymphocytes Automated: 33.6 %
MCH: 31.3 pg (ref 25.1–33.5)
MCHC: 33.9 g/dL (ref 31.5–35.8)
MCV: 92.2 fL (ref 78.0–96.0)
MPV: 11.2 fL (ref 8.9–12.5)
Monocytes Absolute Automated: 0.34 10*3/uL (ref 0.21–0.85)
Monocytes: 6.3 %
Neutrophils Absolute: 3.08 10*3/uL (ref 1.10–6.33)
Neutrophils: 56.5 %
Nucleated RBC: 0 /100 WBC (ref 0.0–0.0)
Platelets: 166 10*3/uL (ref 142–346)
RBC: 3.58 10*6/uL — ABNORMAL LOW (ref 3.90–5.10)
RDW: 13 % (ref 11–15)
WBC: 5.44 10*3/uL (ref 3.10–9.50)

## 2022-06-17 LAB — COMPREHENSIVE METABOLIC PANEL
ALT: 12 U/L (ref 0–55)
AST (SGOT): 23 U/L (ref 5–41)
Albumin/Globulin Ratio: 1.6 (ref 0.9–2.2)
Albumin: 4 g/dL (ref 3.5–5.0)
Alkaline Phosphatase: 56 U/L (ref 37–117)
Anion Gap: 3 — ABNORMAL LOW (ref 5.0–15.0)
BUN: 12 mg/dL (ref 7.0–21.0)
Bilirubin, Total: 0.8 mg/dL (ref 0.2–1.2)
CO2: 32 mEq/L — ABNORMAL HIGH (ref 17–29)
Calcium: 9.2 mg/dL (ref 7.9–10.2)
Chloride: 105 mEq/L (ref 99–111)
Creatinine: 1.1 mg/dL — ABNORMAL HIGH (ref 0.4–1.0)
Globulin: 2.5 g/dL (ref 2.0–3.6)
Glucose: 203 mg/dL — ABNORMAL HIGH (ref 70–100)
Potassium: 4.4 mEq/L (ref 3.5–5.3)
Protein, Total: 6.5 g/dL (ref 6.0–8.3)
Sodium: 140 mEq/L (ref 135–145)
eGFR: 52 mL/min/{1.73_m2} — AB (ref >=60)

## 2022-06-17 LAB — HEMOLYSIS INDEX: Hemolysis Index: 5 Index (ref 0–24)

## 2022-06-17 NOTE — Telephone Encounter (Signed)
Patient is Calling to speak with a nurse regarding attached medication states she will be out tomorrow please provide a call back

## 2022-06-18 ENCOUNTER — Ambulatory Visit: Payer: Medicare Other

## 2022-06-18 ENCOUNTER — Telehealth: Payer: Self-pay | Admitting: "Endocrinology

## 2022-06-18 LAB — PT/INR
PT INR: 1 (ref 0.9–1.1)
PT: 11.1 s (ref 10.1–12.9)

## 2022-06-18 LAB — APTT: PTT: 26 s — ABNORMAL LOW (ref 27–39)

## 2022-06-18 MED ORDER — GABAPENTIN 300 MG PO CAPS
300.0000 mg | ORAL_CAPSULE | Freq: Three times a day (TID) | ORAL | 0 refills | Status: DC
Start: 2022-06-18 — End: 2022-08-16

## 2022-06-18 NOTE — Telephone Encounter (Signed)
Spoke to patient she is requesting a refill for Gabapentin . Patient is currently out and she takes the medication TID .    Rx pended .

## 2022-06-18 NOTE — Addendum Note (Signed)
Addended byRip Harbour on: 06/18/2022 08:06 AM     Modules accepted: Orders

## 2022-06-18 NOTE — PSS Phone Screening (Signed)
06/17/22 LABS IN LABS. GLU 203, CREAT 1.1, H/H 11.2 / 33. PT/PTT PENDING  02/15/22 HA1C 6.2  06/17/22 EKG IN EKG  06/17/22 MED CL R SPARKS NP IN NOTES  02/15/22 ENDOCRINOL NOTE NP DELGADO IN NOTES    Pre-Anesthesia Evaluation    Pre-op phone visit requested by:   Reason for pre-op phone visit: Patient anticipating ARTHROPLASTY, SHOULDER REVERSE procedure.         No orders of the defined types were placed in this encounter.      History of Present Illness/Summary:        Problem List:  Medical Problems       Hospital Problem List  Date Reviewed: 06/17/2022   None        Non-Hospital Problem List  Date Reviewed: 06/17/2022            ICD-10-CM Priority Class Noted    Rheumatoid arthritis M06.9   06/23/2020    Overview Addendum 09/25/2021  2:13 PM by Gaetano Hawthorne, DNP FNP     - First diagnosed about 15 years ago   - Followed now by rheumatology in our area (Dr. Donette Larry)  - Currently on Remicade infusions every 4 weeks  - Also uses diclofenac PRN, hydroxychloroquine daily, methotrexate (six tablets) weekly   -Has chronic fatigue from this         Diabetes mellitus E11.9   12/12/2019    Overview Addendum 08/13/2021  1:48 PM by Gaetano Hawthorne, DNP FNP     Followed by Bynum Bellows, last A1c 07/2021 was 6.5%         Essential hypertension (Chronic) I10   12/12/2019    Overview Signed 08/13/2021 11:58 AM by Gaetano Hawthorne, DNP FNP     - Had a nuclear stress test and carotid doppler in the past which were normal  - Still has plans to follow-up with a cardiologist to establish care         Hyperlipidemia (Chronic) E78.5   12/12/2019    Overview Addendum 02/12/2022  4:03 PM by Gaetano Hawthorne, DNP FNP     Pt takes pitavastatin, rx'd by Dr. Leonie Douglas         Hypothyroidism E03.9   12/12/2019    Overview Signed 08/13/2021 11:56 AM by Gaetano Hawthorne, DNP FNP     Managed by Bynum Bellows. Last apt 07/2021         Anxiety and depression (Chronic) F41.9, F32.A   12/12/2019    Overview Addendum 08/13/2021  1:32 PM by Gaetano Hawthorne, DNP FNP      - Has stopped taking Lexapro 20 mg daily and states overall she's feeling well   - Using Trazodone 50 mg nightly as needed  - Also using a CBD OTC gummy vitamin with good relief (doesn't recall which)  - Using Ativan 1 mg PRN and states her panic attacks have recently been better controlled  - Lost her husband a few years ago   - Recently relocated to be closer to family          Primary localized osteoarthrosis of ankle and foot M19.079   05/11/2019    Polyneuropathy due to type 2 diabetes mellitus E11.42   12/12/2019    Overview Addendum 09/25/2021  2:13 PM by Gaetano Hawthorne, DNP FNP     Has neuropathy in feet for which she takes Osempic and gabapentin for.          Stage 3a chronic kidney disease N18.31   08/13/2021    Overview Addendum  08/13/2021  1:40 PM by Gaetano Hawthorne, DNP FNP     Secondary to diabetes. Last GFR 53, has been stable so was d/c from nephrology. Nephrology still manages BP         Positive QuantiFERON-TB Gold test R76.12   10/14/2015    Other specified arthritis, right shoulder M13.811   06/14/2022        Medical History   Diagnosis Date    Anemia     H/H 11.2 / 33 06/17/22    Anxiety     SITUATIONAL    Diabetes mellitus     Disorder of thyroid     Ear, nose and throat disorder     ALLERGIES    Hyperlipidemia     Hypertension     Hypothyroidism     MANAGED BY ENDOCRINOL- STABLE    Neuropathy     CONTROLLED WITH GABAPENTIN    Other specified arthritis, right shoulder     PREOP DIAGNOSIS    Renal insufficiency     CKD 3 MONITORED BY PCP. CREAT 1.1 06/17/22    Rheumatoid arthritis     DR Kipp Brood- LAST SEEN FOE REMICADE 04/2022    Seasonal allergic rhinitis     ALLEGRA    Type 2 diabetes mellitus, controlled     MANAGED BY ENDOCRINOL NOTE 02/15/22 IN NOTES.  AM FBS 130-160. HA1C 02/15/22 6.2     Past Surgical History:   Procedure Laterality Date    EYE SURGERY      CATARCTS    FINGER SURGERY Right     INDEX    HYSTERECTOMY      KNEE SURGERY Right 11/30/2019    knee replacement by Dr. Harlene Salts    ROTATOR  CUFF REPAIR Left     SHOULDER SURGERY Right 2019    Rotator cuff    SHOULDER SURGERY Right     PUT BACK IN SOCKET        Medication List            Accurate as of June 18, 2022 12:12 PM. Always use your most recent med list.                Accu-Chek SmartView test strip  USE DAILY  Generic drug: glucose blood     fexofenadine 180 MG tablet  Take 1 tablet (180 mg) by mouth every morning  Commonly known as: ALLEGRA  Medication Adjustments for Surgery: Take morning of surgery     folic acid 1 MG tablet  TAKE 1 TABLET BY MOUTH EVERY DAY  Commonly known as: FOLVITE  Medication Adjustments for Surgery: Hold day of surgery     gabapentin 300 MG capsule  Take 1 capsule (300 mg) by mouth 3 (three) times daily  Commonly known as: NEURONTIN  Medication Adjustments for Surgery: Take morning of surgery     hydroxychloroquine 200 MG tablet  Take 1.5 tablets (300 mg) by mouth every morning TAKES 1 1/2 TABS  Commonly known as: PLAQUENIL  Medication Adjustments for Surgery: Take morning of surgery     levothyroxine 25 MCG tablet  Take 1 tablet (25 mcg) by mouth Once a day at 6:00am  Commonly known as: SYNTHROID  Medication Adjustments for Surgery: Take morning of surgery     losartan-hydrochlorothiazide 100-25 MG per tablet  Take 1 tablet by mouth daily  Commonly known as: HYZAAR  Medication Adjustments for Surgery: Hold day of surgery     metoprolol succinate XL 50 MG 24 hr tablet  Take 1 tablet (50 mg) by mouth daily  Commonly known as: TOPROL-XL  Medication Adjustments for Surgery: Take morning of surgery     Ozempic (1 MG/DOSE) 4 MG/3ML Sopn  Inject 1 mg into the skin once a week  Generic drug: Semaglutide (1 MG/DOSE)  Notes to patient: LAST DOSE BEFORE SURGERY 06/20/22     potassium chloride 10 MEQ CR tablet  TAKE 1 TABLET(10 MEQ) BY MOUTH DAILY  Commonly known as: KLOR-CON M10  Medication Adjustments for Surgery: Take morning of surgery     REMICADE IV  Infuse into the vein every 28 days LAST DOSE 8/213/23, NEXT DOSE AFTER  SURGERY  Notes to patient: NOT DUE BEFORE SURGERY     rosuvastatin 20 MG tablet  Take 1 tablet (20 mg) by mouth daily  Commonly known as: CRESTOR  Medication Adjustments for Surgery: Take morning of surgery     traMADol 50 MG tablet  Take 1 tablet (50 mg) by mouth every 6 (six) hours as needed for Pain  Commonly known as: ULTRAM  Medication Adjustments for Surgery: Take as needed     traZODone 50 MG tablet  Take 1 tablet (50 mg) by mouth nightly  Commonly known as: DESYREL  Notes to patient: TAKE PM BEFORE SURGERY     Tylenol 325 MG tablet  Take 2 tablets (650 mg) by mouth every 8 (eight) hours  Generic drug: acetaminophen  Notes to patient: TAKE ON 06/28/22, 06/29/22, 06/30/22     Womens 50+ Multi Vitamin/Min Tabs  Take 1 tablet by mouth daily  Medication Adjustments for Surgery: Hold day of surgery            Allergies   Allergen Reactions    Penicillin G      Reports allergic as a child       Family History   Problem Relation Age of Onset    No known problems Mother     No known problems Father      Social History     Occupational History    Not on file   Tobacco Use    Smoking status: Never    Smokeless tobacco: Never   Vaping Use    Vaping Use: Never used   Substance and Sexual Activity    Alcohol use: Never    Drug use: Yes     Types: Marijuana     Comment: Gummie edibles for pain and anxiety-WILL NOT TAKE 7 DAYS BEFORE DOS    Sexual activity: Not on file       Menstrual History:   LMP / Status  Postmenopausal     No LMP recorded. Patient is postmenopausal.    Tubal Ligation?  No valid surgical or medical questions entered.             Exam Scores:   SDB score  Risk Category: No Risk    STBUR score       PONV score  Nausea Risk: VERY SEVERE RISK    MST score  MST Score: 0    Allergy score  Risk Category: Moderate Risk    Frailty score  CFS Score: 4       Visit Vitals  Ht 1.549 m (5\' 1" )   Wt 60.8 kg (134 lb)   BMI 25.32 kg/m       Recent Labs   CBC (last 180 days) 04/26/22  1515 06/17/22  1539   WBC 6.58 5.44    RBC 3.49* 3.58*   Hgb  11.0* 11.2*   Hematocrit 32.0* 33.0*   MCV 91.7 92.2   MCH 31.5 31.3   MCHC 34.4 33.9   RDW 13 13   Platelets 156 166   MPV 11.1 11.2   Nucleated RBC 0.0 0.0   Absolute NRBC 0.00 0.00     Recent Labs   BMP (last 180 days) 04/26/22  1515 06/17/22  1539   Glucose 190* 203*   BUN 16.0 12.0   Creatinine 1.2* 1.1*   Sodium 142 140   Potassium 3.8 4.4   Chloride 103 105   CO2 32* 32*   Calcium 9.1 9.2   Anion Gap 7.0 3.0*   eGFR 46.7* 52.0*     Recent Labs   Coag Panel (last 180 days) 04/26/22  1515   PTT 24*   PT 11.4   PT INR 1.0     Recent Labs   Other (last 180 days) 02/15/22  1119 04/26/22  1515 06/17/22  1539   TSH 1.43  --   --    Bilirubin, Total 0.9 0.8 0.8   ALT 17 15 12    AST (SGOT) 30 24 23    Protein, Total 6.5 6.4 6.5   Hemoglobin A1C 6.2*  --   --    Culture MRSA Surveillance  --  Negative for Methicillin Resistant Staph aureus  --

## 2022-06-18 NOTE — Telephone Encounter (Signed)
Pt called in needing to re-schx 10/26 appt w NP Leonie Douglas, having avaya issues and unable to hear pt (call went silent mid call)    If she calls back pls re-schx to next available in office, she is aware it is around feb-march    Thank you

## 2022-06-21 NOTE — Progress Notes (Signed)
Hi Claudia Adams:    The following lab results are within acceptable limits:     Coagulation time, PT/INR, PTT are relatively normal.   Declined renal function consists with history of CKD stage 3 A.   Slightly anemic that consists with CKD.     Will forward information to surgeon's office.     Regards,     Constance Holster, AGNP

## 2022-06-24 ENCOUNTER — Other Ambulatory Visit: Payer: Self-pay | Admitting: "Endocrinology

## 2022-06-24 NOTE — Telephone Encounter (Signed)
Name, strength, directions of requested refill(s): accu chek guide       How much medication is remaining: got the wrong test trips     Pharmacy to send refill to or patient to pick up rx from office (mark requested pharmacy in BOLD):      [id number] [place], [place] - [address] AT [facility] OF [facility] [address]  [address]  [facility]  [address]  Phone: [phone] Fax: [phone]        Please mark "X" next to the preferred call back number:    Mobile: [phone] (mobile)    Home: [phone] (home)    Work: @WORKPHONE @        Medication refill request, see above. Thank you   Patient has been informed that medication refill requests should be called in up to one week prior to running out of medication.    Additional Notes: Pt is requesting a refill for the supply above. Can someone please assist the patient.   Next Visit: 10/26

## 2022-06-25 MED ORDER — ACCU-CHEK GUIDE VI STRP
1.0000 | ORAL_STRIP | 3 refills | Status: DC | PRN
Start: 2022-06-25 — End: 2022-07-06

## 2022-06-30 NOTE — Anesthesia Preprocedure Evaluation (Signed)
Anesthesia Evaluation    AIRWAY    Mallampati: II    TM distance: >3 FB  Neck ROM: full  Mouth Opening:full  Planned to use difficult airway equipment: No CARDIOVASCULAR    regular and normal       DENTAL    no notable dental hx               PULMONARY    clear to auscultation     OTHER FINDINGS    BG 160 this am                                    Relevant Problems   CARDIO   (+) Essential hypertension      GU/RENAL   (+) Stage 3a chronic kidney disease      ENDO   (+) Hypothyroidism      OTHER   (+) Other specified arthritis, right shoulder   (+) Rheumatoid arthritis               Anesthesia Plan    ASA 3     general                     intravenous induction   Detailed anesthesia plan: general endotracheal and PNB      Post Op:  Trial extubation is planned.    Post op pain management: per surgeon and PNB single shot    informed consent obtained    ECG reviewed  pertinent labs reviewed               Signed by: Feliberto Gottron, DO 06/30/22 7:13 PM

## 2022-07-01 ENCOUNTER — Ambulatory Visit: Payer: Medicare Other | Admitting: Anesthesiology

## 2022-07-01 ENCOUNTER — Ambulatory Visit (INDEPENDENT_AMBULATORY_CARE_PROVIDER_SITE_OTHER): Payer: Medicare Other | Admitting: "Endocrinology

## 2022-07-01 ENCOUNTER — Encounter: Payer: Self-pay | Admitting: Orthopaedic Surgery

## 2022-07-01 ENCOUNTER — Ambulatory Visit: Payer: Medicare Other

## 2022-07-01 ENCOUNTER — Encounter
Admission: RE | Disposition: A | Discharge status: Rehabilitation Facility | Payer: Self-pay | Source: Ambulatory Visit | Attending: Orthopaedic Surgery

## 2022-07-01 ENCOUNTER — Ambulatory Visit
Admission: RE | Admit: 2022-07-01 | Discharge: 2022-07-02 | Disposition: A | Discharge status: Rehabilitation Facility | Payer: Medicare Other | Source: Ambulatory Visit | Attending: Orthopaedic Surgery | Admitting: Orthopaedic Surgery

## 2022-07-01 DIAGNOSIS — M069 Rheumatoid arthritis, unspecified: Secondary | ICD-10-CM | POA: Insufficient documentation

## 2022-07-01 DIAGNOSIS — E785 Hyperlipidemia, unspecified: Secondary | ICD-10-CM | POA: Insufficient documentation

## 2022-07-01 DIAGNOSIS — M7521 Bicipital tendinitis, right shoulder: Secondary | ICD-10-CM | POA: Insufficient documentation

## 2022-07-01 DIAGNOSIS — M064 Inflammatory polyarthropathy: Secondary | ICD-10-CM | POA: Insufficient documentation

## 2022-07-01 DIAGNOSIS — I129 Hypertensive chronic kidney disease with stage 1 through stage 4 chronic kidney disease, or unspecified chronic kidney disease: Secondary | ICD-10-CM | POA: Insufficient documentation

## 2022-07-01 DIAGNOSIS — D649 Anemia, unspecified: Secondary | ICD-10-CM | POA: Insufficient documentation

## 2022-07-01 DIAGNOSIS — M19011 Primary osteoarthritis, right shoulder: Secondary | ICD-10-CM | POA: Insufficient documentation

## 2022-07-01 DIAGNOSIS — E1122 Type 2 diabetes mellitus with diabetic chronic kidney disease: Secondary | ICD-10-CM | POA: Insufficient documentation

## 2022-07-01 DIAGNOSIS — Z7985 Long-term (current) use of injectable non-insulin antidiabetic drugs: Secondary | ICD-10-CM | POA: Insufficient documentation

## 2022-07-01 DIAGNOSIS — D696 Thrombocytopenia, unspecified: Secondary | ICD-10-CM | POA: Insufficient documentation

## 2022-07-01 DIAGNOSIS — F419 Anxiety disorder, unspecified: Secondary | ICD-10-CM | POA: Insufficient documentation

## 2022-07-01 DIAGNOSIS — M13811 Other specified arthritis, right shoulder: Secondary | ICD-10-CM | POA: Insufficient documentation

## 2022-07-01 DIAGNOSIS — J302 Other seasonal allergic rhinitis: Secondary | ICD-10-CM | POA: Insufficient documentation

## 2022-07-01 DIAGNOSIS — N183 Chronic kidney disease, stage 3 unspecified: Secondary | ICD-10-CM | POA: Insufficient documentation

## 2022-07-01 DIAGNOSIS — M75121 Complete rotator cuff tear or rupture of right shoulder, not specified as traumatic: Secondary | ICD-10-CM | POA: Insufficient documentation

## 2022-07-01 DIAGNOSIS — E039 Hypothyroidism, unspecified: Secondary | ICD-10-CM | POA: Insufficient documentation

## 2022-07-01 HISTORY — PX: ARTHROPLASTY SHOULDER REVERSE: SHX51021

## 2022-07-01 HISTORY — PX: SHOULDER ARTHROSCOPY: SHX128

## 2022-07-01 LAB — GLUCOSE WHOLE BLOOD - POCT
Whole Blood Glucose POCT: 167 mg/dL — ABNORMAL HIGH (ref 70–100)
Whole Blood Glucose POCT: 263 mg/dL — ABNORMAL HIGH (ref 70–100)
Whole Blood Glucose POCT: 210 mg/dL — ABNORMAL HIGH (ref 70–100)
Whole Blood Glucose POCT: 277 mg/dL — ABNORMAL HIGH (ref 70–100)

## 2022-07-01 SURGERY — ARTHROPLASTY, SHOULDER REVERSE
Anesthesia: Anesthesia General | Site: Shoulder | Laterality: Right | Wound class: Clean

## 2022-07-01 MED ORDER — EPHEDRINE SULFATE 50 MG/ML IJ/IV SOLN (WRAP)
Status: DC | PRN
Start: 2022-07-01 — End: 2022-07-01
  Administered 2022-07-01: 15 mg via INTRAVENOUS
  Administered 2022-07-01: 10 mg via INTRAVENOUS

## 2022-07-01 MED ORDER — ROCURONIUM BROMIDE 10 MG/ML IV SOLN (WRAP)
INTRAVENOUS | Status: DC | PRN
Start: 2022-07-01 — End: 2022-07-01
  Administered 2022-07-01: 30 mg via INTRAVENOUS
  Administered 2022-07-01: 20 mg via INTRAVENOUS

## 2022-07-01 MED ORDER — ALUM & MAG HYDROXIDE-SIMETH 200-200-20 MG/5ML PO SUSP
15.0000 mL | ORAL | Status: DC | PRN
Start: 2022-07-01 — End: 2022-07-02

## 2022-07-01 MED ORDER — INSULIN LISPRO 100 UNIT/ML SOLN (WRAP)
1.0000 [IU] | Freq: Three times a day (TID) | Status: DC
Start: 2022-07-01 — End: 2022-07-02
  Administered 2022-07-01: 2 [IU] via SUBCUTANEOUS
  Administered 2022-07-02: 1 [IU] via SUBCUTANEOUS
  Administered 2022-07-02: 2 [IU] via SUBCUTANEOUS
  Filled 2022-07-01: qty 3
  Filled 2022-07-01 (×2): qty 6

## 2022-07-01 MED ORDER — PROMETHAZINE HCL 25 MG/ML IJ SOLN
6.2500 mg | Freq: Once | INTRAMUSCULAR | Status: DC | PRN
Start: 2022-07-01 — End: 2022-07-01

## 2022-07-01 MED ORDER — ACETAMINOPHEN 325 MG PO TABS
325.0000 mg | ORAL_TABLET | ORAL | Status: DC
Start: 2022-07-01 — End: 2022-07-02
  Administered 2022-07-01 – 2022-07-02 (×6): 325 mg via ORAL
  Filled 2022-07-01 (×6): qty 1

## 2022-07-01 MED ORDER — ONDANSETRON HCL 4 MG/2ML IJ SOLN
4.0000 mg | Freq: Once | INTRAMUSCULAR | Status: DC | PRN
Start: 2022-07-01 — End: 2022-07-01

## 2022-07-01 MED ORDER — DEXTROSE 10 % IV BOLUS
12.5000 g | INTRAVENOUS | Status: DC | PRN
Start: 2022-07-01 — End: 2022-07-02

## 2022-07-01 MED ORDER — INSULIN LISPRO 100 UNIT/ML SOLN (WRAP)
1.0000 [IU] | Freq: Every evening | Status: DC
Start: 2022-07-01 — End: 2022-07-02
  Administered 2022-07-01: 2 [IU] via SUBCUTANEOUS
  Filled 2022-07-01: qty 6

## 2022-07-01 MED ORDER — POTASSIUM CHLORIDE CRYS ER 10 MEQ PO TBCR
10.0000 meq | EXTENDED_RELEASE_TABLET | Freq: Every morning | ORAL | Status: DC
Start: 2022-07-01 — End: 2022-07-02
  Administered 2022-07-02: 10 meq via ORAL
  Filled 2022-07-01: qty 1

## 2022-07-01 MED ORDER — PROPOFOL 10 MG/ML IV EMUL (WRAP)
INTRAVENOUS | Status: DC | PRN
Start: 2022-07-01 — End: 2022-07-01
  Administered 2022-07-01: 150 mg via INTRAVENOUS

## 2022-07-01 MED ORDER — LACTATED RINGERS IV SOLN
INTRAVENOUS | Status: DC
Start: 2022-07-01 — End: 2022-07-01
  Administered 2022-07-01: 1000 mL via INTRAVENOUS

## 2022-07-01 MED ORDER — MIDAZOLAM HCL 1 MG/ML IJ SOLN (WRAP)
INTRAMUSCULAR | Status: AC
Start: 2022-07-01 — End: ?
  Filled 2022-07-01: qty 2

## 2022-07-01 MED ORDER — TRAZODONE HCL 50 MG PO TABS
50.0000 mg | ORAL_TABLET | Freq: Every evening | ORAL | Status: DC
Start: 2022-07-01 — End: 2022-07-02
  Administered 2022-07-01: 50 mg via ORAL
  Filled 2022-07-01: qty 1

## 2022-07-01 MED ORDER — DOCUSATE SODIUM 100 MG PO CAPS
100.0000 mg | ORAL_CAPSULE | Freq: Three times a day (TID) | ORAL | Status: DC
Start: 2022-07-01 — End: 2022-07-02
  Administered 2022-07-01 – 2022-07-02 (×3): 100 mg via ORAL
  Filled 2022-07-01 (×3): qty 1

## 2022-07-01 MED ORDER — LOSARTAN POTASSIUM 100 MG PO TABS
ORAL_TABLET | Freq: Every day | ORAL | Status: DC
Start: 2022-07-02 — End: 2022-07-02

## 2022-07-01 MED ORDER — BUPIVACAINE HCL (PF) 0.5 % IJ SOLN
INTRAMUSCULAR | Status: DC | PRN
Start: 2022-07-01 — End: 2022-07-01
  Administered 2022-07-01: 15 mL via EPIDURAL

## 2022-07-01 MED ORDER — HYDROCODONE-ACETAMINOPHEN 5-325 MG PO TABS
1.0000 | ORAL_TABLET | Freq: Once | ORAL | Status: DC | PRN
Start: 2022-07-01 — End: 2022-07-01

## 2022-07-01 MED ORDER — LIDOCAINE 1% BUFFERED - CNR/OUTSOURCED
0.3000 mL | Freq: Once | INTRAMUSCULAR | Status: AC
Start: 2022-07-01 — End: 2022-07-01
  Administered 2022-07-01: 0.3 mL via INTRADERMAL
  Filled 2022-07-01: qty 1

## 2022-07-01 MED ORDER — LEVOTHYROXINE SODIUM 25 MCG PO TABS
25.0000 ug | ORAL_TABLET | Freq: Every day | ORAL | Status: DC
Start: 2022-07-01 — End: 2022-07-02
  Administered 2022-07-02: 25 ug via ORAL
  Filled 2022-07-01: qty 1

## 2022-07-01 MED ORDER — CELECOXIB 100 MG PO CAPS
100.0000 mg | ORAL_CAPSULE | Freq: Two times a day (BID) | ORAL | Status: DC
Start: 2022-07-01 — End: 2022-07-02
  Administered 2022-07-01 – 2022-07-02 (×2): 100 mg via ORAL
  Filled 2022-07-01 (×2): qty 1

## 2022-07-01 MED ORDER — GLUCAGON 1 MG IJ SOLR (WRAP)
1.0000 mg | INTRAMUSCULAR | Status: DC | PRN
Start: 2022-07-01 — End: 2022-07-02

## 2022-07-01 MED ORDER — MORPHINE SULFATE 2 MG/ML IJ/IV SOLN (WRAP)
2.0000 mg | Status: DC | PRN
Start: 2022-07-01 — End: 2022-07-01

## 2022-07-01 MED ORDER — MIDAZOLAM HCL 1 MG/ML IJ SOLN (WRAP)
INTRAMUSCULAR | Status: DC | PRN
Start: 2022-07-01 — End: 2022-07-01
  Administered 2022-07-01: 2 mg via INTRAVENOUS

## 2022-07-01 MED ORDER — BUPIVACAINE HCL (PF) 0.5 % IJ SOLN
INTRAMUSCULAR | Status: AC
Start: 2022-07-01 — End: 2022-07-01
  Filled 2022-07-01: qty 30

## 2022-07-01 MED ORDER — LIDOCAINE HCL (PF) 1 % IJ SOLN
INTRAMUSCULAR | Status: DC | PRN
Start: 2022-07-01 — End: 2022-07-01
  Administered 2022-07-01: 3 mL via INTRAVENOUS

## 2022-07-01 MED ORDER — OXYCODONE HCL 10 MG PO TABS
10.0000 mg | ORAL_TABLET | ORAL | Status: DC | PRN
Start: 2022-07-01 — End: 2022-07-02

## 2022-07-01 MED ORDER — BUPIVACAINE LIPOSOME 1.3 % IJ SUSP
10.0000 mL | INTRAMUSCULAR | Status: AC
Start: 2022-07-01 — End: 2022-07-01
  Administered 2022-07-01: 10 mL
  Filled 2022-07-01: qty 10

## 2022-07-01 MED ORDER — DIPHENHYDRAMINE HCL 25 MG PO CAPS
50.0000 mg | ORAL_CAPSULE | ORAL | Status: DC | PRN
Start: 2022-07-01 — End: 2022-07-02

## 2022-07-01 MED ORDER — ROSUVASTATIN CALCIUM 20 MG PO TABS
20.0000 mg | ORAL_TABLET | Freq: Every morning | ORAL | Status: DC
Start: 2022-07-01 — End: 2022-07-02
  Administered 2022-07-02: 20 mg via ORAL
  Filled 2022-07-01: qty 1

## 2022-07-01 MED ORDER — GLUCOSE 40 % PO GEL (WRAP)
15.0000 g | ORAL | Status: DC | PRN
Start: 2022-07-01 — End: 2022-07-02

## 2022-07-01 MED ORDER — OXYCODONE HCL 5 MG PO TABS
5.0000 mg | ORAL_TABLET | ORAL | Status: DC | PRN
Start: 2022-07-01 — End: 2022-07-02
  Administered 2022-07-02: 5 mg via ORAL
  Filled 2022-07-01: qty 1

## 2022-07-01 MED ORDER — FENTANYL CITRATE (PF) 50 MCG/ML IJ SOLN (WRAP)
INTRAMUSCULAR | Status: AC
Start: 2022-07-01 — End: ?
  Filled 2022-07-01: qty 2

## 2022-07-01 MED ORDER — CEFAZOLIN SODIUM 1 G IJ SOLR
INTRAMUSCULAR | Status: DC | PRN
Start: 2022-07-01 — End: 2022-07-01
  Administered 2022-07-01: 2 g via INTRAVENOUS

## 2022-07-01 MED ORDER — METHYLENE BLUE (ANTIDOTE) 50 MG/10ML IV SOLN
INTRAVENOUS | Status: AC
Start: 2022-07-01 — End: ?
  Filled 2022-07-01: qty 10

## 2022-07-01 MED ORDER — MAGNESIUM SULFATE IN D5W 1-5 GM/100ML-% IV SOLN
1.0000 g | INTRAVENOUS | Status: DC | PRN
Start: 2022-07-01 — End: 2022-07-02

## 2022-07-01 MED ORDER — TRANEXAMIC ACID-NACL 1000-0.7 MG/100ML-% IV SOLN
INTRAVENOUS | Status: AC
Start: 2022-07-01 — End: ?
  Filled 2022-07-01: qty 100

## 2022-07-01 MED ORDER — HYDRALAZINE HCL 20 MG/ML IJ SOLN
10.0000 mg | Freq: Four times a day (QID) | INTRAMUSCULAR | Status: DC | PRN
Start: 2022-07-01 — End: 2022-07-02

## 2022-07-01 MED ORDER — OXYCODONE HCL 5 MG PO TABS
15.0000 mg | ORAL_TABLET | ORAL | Status: DC | PRN
Start: 2022-07-01 — End: 2022-07-02

## 2022-07-01 MED ORDER — HYDROMORPHONE HCL 0.5 MG/0.5 ML IJ SOLN
0.5000 mg | INTRAMUSCULAR | Status: DC | PRN
Start: 2022-07-01 — End: 2022-07-01

## 2022-07-01 MED ORDER — CETIRIZINE HCL 10 MG PO TABS
10.0000 mg | ORAL_TABLET | Freq: Every day | ORAL | Status: DC
Start: 2022-07-01 — End: 2022-07-02
  Administered 2022-07-02: 10 mg via ORAL
  Filled 2022-07-01: qty 1

## 2022-07-01 MED ORDER — GABAPENTIN 300 MG PO CAPS
300.0000 mg | ORAL_CAPSULE | Freq: Three times a day (TID) | ORAL | Status: DC
Start: 2022-07-01 — End: 2022-07-02
  Administered 2022-07-01 – 2022-07-02 (×4): 300 mg via ORAL
  Filled 2022-07-01 (×4): qty 1

## 2022-07-01 MED ORDER — DEXTROSE 50 % IV SOLN
12.5000 g | INTRAVENOUS | Status: DC | PRN
Start: 2022-07-01 — End: 2022-07-02

## 2022-07-01 MED ORDER — DEXAMETHASONE SODIUM PHOSPHATE 4 MG/ML IJ SOLN
INTRAMUSCULAR | Status: AC
Start: 2022-07-01 — End: ?
  Filled 2022-07-01: qty 1

## 2022-07-01 MED ORDER — TRANEXAMIC ACID-NACL 1000-0.7 MG/100ML-% IV SOLN (NARRATOR USE ONLY)
INTRAVENOUS | Status: DC | PRN
Start: 2022-07-01 — End: 2022-07-01
  Administered 2022-07-01: 1000 mg via INTRAVENOUS

## 2022-07-01 MED ORDER — MELATONIN 3 MG PO TABS
3.0000 mg | ORAL_TABLET | Freq: Every evening | ORAL | Status: DC | PRN
Start: 2022-07-01 — End: 2022-07-02

## 2022-07-01 MED ORDER — DEXAMETHASONE SODIUM PHOSPHATE 4 MG/ML IJ SOLN (WRAP)
INTRAMUSCULAR | Status: DC | PRN
Start: 2022-07-01 — End: 2022-07-01
  Administered 2022-07-01: 4 mg via INTRAVENOUS

## 2022-07-01 MED ORDER — ONDANSETRON HCL 4 MG/2ML IJ SOLN
INTRAMUSCULAR | Status: DC | PRN
Start: 2022-07-01 — End: 2022-07-01
  Administered 2022-07-01: 4 mg via INTRAVENOUS

## 2022-07-01 MED ORDER — POTASSIUM CHLORIDE 10 MEQ/100ML IV SOLN
10.0000 meq | INTRAVENOUS | Status: DC | PRN
Start: 2022-07-01 — End: 2022-07-02

## 2022-07-01 MED ORDER — NALOXONE HCL 0.4 MG/ML IJ SOLN (WRAP)
0.2000 mg | INTRAMUSCULAR | Status: DC | PRN
Start: 2022-07-01 — End: 2022-07-02

## 2022-07-01 MED ORDER — INSULIN LISPRO 100 UNIT/ML SOLN (WRAP)
Status: AC
Start: 2022-07-01 — End: 2022-07-01
  Administered 2022-07-01: 3 [IU] via SUBCUTANEOUS
  Filled 2022-07-01: qty 3

## 2022-07-01 MED ORDER — PROPOFOL 10 MG/ML IV EMUL (WRAP)
INTRAVENOUS | Status: AC
Start: 2022-07-01 — End: ?
  Filled 2022-07-01: qty 20

## 2022-07-01 MED ORDER — ROCURONIUM BROMIDE 50 MG/5ML IV SOLN
INTRAVENOUS | Status: AC
Start: 2022-07-01 — End: ?
  Filled 2022-07-01: qty 5

## 2022-07-01 MED ORDER — POTASSIUM & SODIUM PHOSPHATES 280-160-250 MG PO PACK
2.0000 | PACK | ORAL | Status: DC | PRN
Start: 2022-07-01 — End: 2022-07-02

## 2022-07-01 MED ORDER — METOPROLOL SUCCINATE ER 50 MG PO TB24
50.0000 mg | ORAL_TABLET | Freq: Every morning | ORAL | Status: DC
Start: 2022-07-01 — End: 2022-07-02

## 2022-07-01 MED ORDER — POTASSIUM CHLORIDE CRYS ER 20 MEQ PO TBCR
0.0000 meq | EXTENDED_RELEASE_TABLET | ORAL | Status: DC | PRN
Start: 2022-07-01 — End: 2022-07-02

## 2022-07-01 MED ORDER — ONDANSETRON HCL 4 MG/2ML IJ SOLN
INTRAMUSCULAR | Status: AC
Start: 2022-07-01 — End: ?
  Filled 2022-07-01: qty 2

## 2022-07-01 SURGICAL SUPPLY — 140 items
ADHESIVE LIQUID WATERPROOF VIAL PREP NONSTAIN MASTISOL STYRAX GUM (Skin Closure) IMPLANT
ADHESIVE LQ STYRAX GUM MASTIC ALC MTHY (Skin Closure) IMPLANT
ADHESIVE SKIN CLOSURE DERMABOND ADVANCED (Skin Closure) IMPLANT
ADHESIVE SKIN CLOSURE DERMABOND ADVANCED .7 ML LIQUID APPLICATOR (Skin Closure) IMPLANT
ADHESIVE SKNCLS 2 OCTYL CYNCRLT .7ML (Skin Closure)
APPLICATOR CHLORAPREP 26 ML 70% ISOPROPYL ALCOHOL 2% CHLORHEXIDINE (Applicator) ×2 IMPLANT
APPLICATOR PRP 70% ISPRP 2% CHG 26ML (Applicator) ×2 IMPLANT
BASEPLATE GLENOID 7 D SMALL R AUGMENT OD27 MM SMR TRABECULAR TITANIUM (Base) ×1 IMPLANT
BASEPLATE GLND TRBCLR TI 7D SM R SMR 27MM (Base) ×1 IMPLANT
BLADE SAW STRYKER 7/6/5/4/2000 EHD THK1.27 MM SAGITTAL AGGRESSIVE CUT (Blade) ×1 IMPLANT
BLADE SAW THK1.27 MM OSCILLATE SAGITTAL (Blade) ×1 IMPLANT
BLADE SW THK1.27MM BR2000 90X19MM (Blade) ×1
BODY HUM TI SMR FN RVRS LCK SCR SHLDR (Stem) ×1 IMPLANT
BODY HUMERAL FINNED REVERSE LOCKING SCREW SMR TITANIUM SHOULDER (Stem) ×1 IMPLANT
BOWEL CEMENT (Procedure Accessories) IMPLANT
BOWL BONE CEMENT VACUUM MIX FILTER BASE CARTRIDGE ACM 180 GM (Procedure Accessories) IMPLANT
BRACE ORTH UNV SHLDR ABDCT ROT CTRL ARC2 (Sling) ×1 IMPLANT
BRACE SHOULDER ABDUCTION ROTATION CONTROL UNIVERSAL SLING WAISTBAND 1 (Sling) ×1 IMPLANT
CABLE ORTH SS LCP 1.7MM 750MM NS CRMP (Cable) ×1 IMPLANT
CABLE ORTH TI COCR 1.7MM 750MM STRL CRMP (Cable) ×1 IMPLANT
CABLE ORTHOPEDIC STAINLESS STEEL OD1.7 MM L750 MM CRIMP (Cable) ×1 IMPLANT
CABLE ORTHOPEDIC TITANIUM COCR OD1.7 MM L750 MM CRIMP CERVICAL (Cable) ×1 IMPLANT
CEMENT BN TBR 40CC SMPX P LF STRL FD (Cement) IMPLANT
COMPONENT GLENOID SMALL-R CONNECTOR SCREW REVISION SMR TITANIUM (Screw) ×1 IMPLANT
COMPONENT GLND TI SM-R SMR CNCT SCR REV (Screw) ×1 IMPLANT
COVER EQP SMS PRXM 53X24IN LF STRL (Drape) ×1
COVER EQUIPMENT MEDLINE SMS L53 IN X W24 (Drape) ×1 IMPLANT
COVER EQUIPMENT MEDLINE SMS L53 IN X W24 IN MAYOSTAND (Drape) ×1 IMPLANT
COVER TABLE L90 IN X W60 IN REINFORCE (Drape) ×1 IMPLANT
COVER TABLE L90 IN X W60 IN REINFORCE HEAVY DUTY MEDLINE SMS (Drape) ×1 IMPLANT
COVER TBL SMS 90X60IN LF STRL REINF (Drape) ×1
CURETTE BN PLS QUIK-USE (Procedure Accessories)
CURETTE QUIK-USE BONE PLASTIC (Procedure Accessories) IMPLANT
DRAPE SRG IOBN 23X17IN STRL 2 INCS FLM (Drape) ×2
DRAPE SRG SMS 108X77IN LF STRL ULT LO (Drape) ×2
DRAPE SRG SMS 43528 PRXM 77X53IN LF STRL (Drape) ×1
DRAPE SRG SMS U PRXM 84X60IN LF STRL (Drape) ×2
DRAPE SRGCL FANFOLD ABSRBNT RNFRCMNT 77X53IN PRXM SMS 43528 44X23IN (Drape) ×1 IMPLANT
DRAPE SURGICAL 2 INCISE FILM (Drape) ×2 IMPLANT
DRAPE SURGICAL FANFOLD ABSORBENT (Drape) ×1 IMPLANT
DRAPE SURGICAL IMPERVIOUS LARGE FILM L84 (Drape) ×2 IMPLANT
DRAPE SURGICAL IMPERVIOUS LARGE FILM L84 IN X W60 IN SMS U (Drape) ×2 IMPLANT
DRAPE SURGICAL ULTRA LOW LINT PUNCTURE (Drape) ×2 IMPLANT
DRAPE SURGICAL ULTRAGARD SPLIT L108 IN X W77 IN SMS (Drape) ×2 IMPLANT
DRESSING AQUACEL AG L25 CM X W9 CM (Dressing) ×1 IMPLANT
DRESSING AQUACEL AG L25 CM X W9 CM HYDROCOLLOID ANTIMICROBIAL (Dressing) ×1 IMPLANT
DRESSING HDRCLD HDRFB PU AQCL AG 25X9CM (Dressing) ×1
DRESSING PETRO 3% BI 3BRM GZE XR 8X1IN (Dressing)
DRESSING PETROLATUM XEROFORM L8 IN X W1 (Dressing) IMPLANT
DRESSING PETROLATUM XEROFORM L8 IN X W1 IN 3% BISMUTH TRIBROMOPHENATE (Dressing) IMPLANT
ELECTRODE ADULT PATIENT RETURN L9 FT REM POLYHESIVE ACRYLIC FOAM (Procedure Accessories) ×1 IMPLANT
ELECTRODE ELECTROSURGICAL BLADE L6.5 IN (Cautery) ×1 IMPLANT
ELECTRODE ELECTROSURGICAL BLADE L6.5 IN OD3/32 IN VALLEYLAB STAINLESS (Cautery) ×1 IMPLANT
ELECTRODE ESURG SS BLDE VLAB 3/32IN 6.5 (Cautery) ×1
ELECTRODE PATIENT RETURN L9 FT VALLEYLAB (Procedure Accessories) ×1 IMPLANT
ELECTRODE PT RTN RM PHSV ACRL FM C30- LB (Procedure Accessories) ×1
GLOVE SRG NTR RBR 8 INDCTR BGL 299X103MM (Glove) ×1
GLOVE SRG PLISPRN 7 BGL PI MIC 288MM LF (Glove) ×3
GLOVE SURGICAL 7 BIOGEL PI MICRO POWDER (Glove) ×3 IMPLANT
GLOVE SURGICAL 7 BIOGEL PI MICRO POWDER FREE BEAD CUFF MICRO ROUGHEN (Glove) ×3 IMPLANT
GLOVE SURGICAL 8 INDICATOR BIOGEL POWDER (Glove) ×1 IMPLANT
GLOVE SURGICAL 8 INDICATOR BIOGEL POWDER FREE SMOOTH BEAD CUFF (Glove) ×1 IMPLANT
HOOD SRG FLYTE STRL PLWY LEN LVL 1 DISP (Procedure Accessories) ×4
HOOD SURGEON ZIPPER PEEL AWAY LENS FLYTE (Procedure Accessories) ×4 IMPLANT
IRRIGATION SURGILAV EXP (Irrigation Solutions) ×1 IMPLANT
KIT FIBERWIRE SUTURE (Kits) ×2
KIT SUTURE L38 IN 2 5 TAPER NEEDLE BLUE (Kits) ×2 IMPLANT
KIT SUTURE L38 IN 2 5 TAPER NEEDLE FIBERWIRE BLUE WHITE (Kits) ×2 IMPLANT
LINER HUM UHMWPE STD SMR 36MM RVRS (Liner) ×1 IMPLANT
LINER HUMERAL STANDARD REVERSE OD36 MM SMR UHMWPE (Liner) ×1 IMPLANT
MASK FACE T-MAX (Respiratory Supplies) ×1 IMPLANT
MASK FACE TMX STRL (Respiratory Supplies) ×1
PAD ABD DERMACEA 9X5IN LF STRL 4 SL EDG (Dressing)
PAD ABDOMINAL L9 IN X W5 IN 4 SEAL EDGE (Dressing) IMPLANT
PEG SMR TT METAL BACK S-R MEDIUM (Peg) ×1 IMPLANT
POUCH DRAPE L23 IN X W19 IN ADHESIVE (Drape) IMPLANT
POUCH DRAPE L23 IN X W19 IN ADHSV STRP EXIT PRT SCRN STERI-DRAPE PLSTC (Drape) IMPLANT
POUCH DRP PLS STRDRP 23X19IN STRL ADH (Drape)
SCREW BN .5MM 20MM LF STRL FLT HD CORT (Screw) ×2 IMPLANT
SCREW BN .5MM 2MM LF STRL FLT HD CORT (Screw) ×1 IMPLANT
SCREW BN .5MM 30MM LF STRL FLT HD CORT (Screw) ×1 IMPLANT
SCREW L20 MM OD.5 MM CORTICAL FLAT HEAD BONE BONE SCREWS (Screw) ×2 IMPLANT
SCREW L28 MM OD5 MM CORTICAL FLAT HEAD BONE BONE SCREWS (Screw) ×1 IMPLANT
SCREW L30 MM OD5 MM CORTICAL FLAT HEAD BONE BONE SCREWS (Screw) ×1 IMPLANT
SET HANDPIECE SUCTION IRRIGATION MULTIORIFICE SOFT CONE SPLASH SHIELD (Irrigation Solutions) ×1 IMPLANT
SOLUTION IRR LR 3L ARTHMTC LF PLS CNTNR (Irrigation Solutions) ×1
SOLUTION IRRIGATION LACTATED RINGERS (Irrigation Solutions) ×1 IMPLANT
SOLUTION IRRIGATION LACTATED RINGERS 3000 ML PLASTIC CONTAINER (Irrigation Solutions) ×1 IMPLANT
SOLUTION PRP 4% CHG 4OZ SCR CR EXDN ANMC (Scrub Supplies) ×1 IMPLANT
SPONGE LAP 18X18IN PREWASH WHT (Sponge) ×3
SPONGE LAPAROTOMY L18 IN X W18 IN (Sponge) ×3 IMPLANT
SPONGE LAPAROTOMY L18 IN X W18 IN PREWASH WHITE (Sponge) ×3 IMPLANT
STAPLER SKIN L4.1 MM X W6.5 MM 35 WIDE (Staplers) ×1 IMPLANT
STAPLER SKIN L4.1 MM X W6.5 MM 35 WIDE STAPLE CARTRIDGE APPOSE ULC (Staplers) ×1 IMPLANT
STAPLER SKN SS PLS APS U 4.1X6.5MM LF 35 (Staplers) ×1
STEM HUM TI SMR 14MM 80MM FN CMNTLS (Stem) ×1 IMPLANT
STEM HUMERAL L80 MM OD14 MM FINNED CEMENTLESS SMR TITANIUM (Stem) ×1 IMPLANT
STOCKINETTE L48 IN X W12 IN IMPERVIOUS (Ortho Supply) ×1 IMPLANT
STOCKINETTE L48 IN X W12 IN IMPERVIOUS PULL TAB HOLLOW ORTHOPEDIC (Ortho Supply) ×1 IMPLANT
STOCKINETTE ORTH 48X12IN LF STRL IMPRV (Ortho Supply) ×1
STRAP POSITIONING L19 IN X W3.5 IN (Procedure Accessories) ×1 IMPLANT
STRAP POSITIONING L19 IN X W3.5 IN STIRRUP SLIP RING TECLIN (Procedure Accessories) ×1 IMPLANT
STRAP PSTN TECLIN 19X3.5IN LF NS STRUP (Procedure Accessories) ×1
STRIP SKIN CLOSURE L4 IN X W1/2 IN (Dressing) IMPLANT
STRIP SKIN CLOSURE L4 IN X W1/2 IN REINFORCE STERI-STRIP POLYESTER (Dressing) IMPLANT
STRIP SKNCLS PLSTR STRSTRP 4X.5IN LF (Dressing)
SUTURE ABS 0 CT1 VCL 18IN CR BRD 8 STRN (Suture) ×1
SUTURE ABS 2-0 CT1 VCL 18IN CR BRD 8 (Suture) ×1
SUTURE COATED VICRYL 0 CT-1 L18 IN (Suture) ×1 IMPLANT
SUTURE COATED VICRYL 0 CT-1 L18 IN CONTROL BRD 8 STRAND VLT ABSRBBL (Suture) ×1 IMPLANT
SUTURE COATED VICRYL 2-0 CT-1 18IN CNTRL BRD 8 STRND UNDYED (Suture) ×1 IMPLANT
SUTURE COATED VICRYL 2-0 CT-1 L18 IN (Suture) ×1 IMPLANT
SUTURE NABSB 2 T-8 FBRWR 38IN 2 ARM BRD (Wire) IMPLANT
SUTURE NABSB 5 CCS-1 FBRWR 38IN BRD TIE (Wire) IMPLANT
SYRINGE IRR 70CC TM LF STRL LL ADPR TIP (Syringes, Needles) IMPLANT
SYRINGE IRR TVK 60ML LF STRL LID SFT BLB (Syringes, Needles) ×1
SYRINGE MEDLINE 60 ML LID SOFT BULB TIP (Syringes, Needles) ×1 IMPLANT
SYRINGE MEDLINE 60 ML LID SOFT BULB TIP IRRIGATION TYVEK (Syringes, Needles) ×1 IMPLANT
TAPE SRG FM MCFM 5.5YDX3IN LF HPOAL WTR (Tape) ×1
TAPE SURGICAL L5 1/2 YD X W3 IN (Tape) ×1 IMPLANT
TAPE SURGICAL L5 1/2 YD X W3 IN HYPOALLERGENIC WATER RESISTANT (Tape) ×1 IMPLANT
TRAY 1LYR 14FR 10ML LATEX UM SAFE (Tray)
TRAY CATHETER L16 IN MEDLINE 1 LAYER 2 (Tray) IMPLANT
TRAY SKIN SCRUB L8 IN 6 WING 6 SPONGE STICK 2 TIP APPLICATOR DRY VINYL (Prep) ×1 IMPLANT
TRAY SKIN SCRUB MEDLINE L8 IN VINYL (Prep) ×1 IMPLANT
TRAY SKN SCRB VNYL CTTN 8IN LF STRL 6 (Prep) ×1
TRAY SRG LF NS LH MAJ DISP (Pack) ×1
TRAY SURGICAL LT HEART MAJOR (Pack) ×1 IMPLANT
TRIAL OD36 MM ECCENTRICAL GLENOSPHERE SMR SHOULDER (Stem) ×1 IMPLANT
TRIAL SHLDR SMR 36MM STRL ECCENTRICAL (Stem) ×1 IMPLANT
TUBE SCT MN CRV KAMVAC LF STRL (Suction) ×1
TUBE SUCTION MINI CURVE KAMVAC (Suction) ×1 IMPLANT
WASHCLOTH CLEANSING L8 IN X W8 IN PH (Procedure Accessories) IMPLANT
WASHCLOTH CLEANSING L8 IN X W8 IN PH BALANCE NEEDLE PUNCH (Procedure Accessories) IMPLANT
WASHCLOTH CLNSG RDBTH READYCLEANSE 8X8IN (Procedure Accessories)
WIRE FIXATION OD2.5 MM L240 MM KIRSCHNER (Guidewire) ×1 IMPLANT
WIRE FX KRSH 2.5MM 240MM LF STRL DISP (Guidewire) ×1 IMPLANT
WRAP CMPR POR CBN 5YDX4IN LF STRL SLFADH (Bandage) ×1
WRAP COMPRESSION L5 YD X W4 IN SELF (Bandage) ×1 IMPLANT
WRAP COMPRESSION L5 YD X W4 IN SELF ADHERENT ELASTIC LIGHTWEIGHT HAND (Bandage) ×1 IMPLANT

## 2022-07-01 NOTE — Progress Notes (Signed)
Pt alert and oriented x4. Pt states that she lives alone. Denies any pain at the moment. Sling on, surgical dressing appears dry and intact. Pt has aquacel dressing to take to rehab at the time of discharge.

## 2022-07-01 NOTE — Progress Notes (Signed)
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  She has no acute complaints.  Pain is well controlled by the nerve block.    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  I recommend reevaluation as an outpatient in the office on 07/15/2022 at 2 PM.  Internal medicine: Appreciate the support of Dr Chanda/[place] with managing comorbidities including but not limited todiabetes mellitus, renal insufficiency  PT/OT: nonweightbearing right upper extremity.  Physical therapy orders were provided in paper form for discharge  Nursing: Discharge instructions including wound care instructions were provided in paper form for discharge  Disposition: Consider discharge to [facility] in [place], [place] when cleared by internal medicine, PT OT

## 2022-07-01 NOTE — Anesthesia Procedure Notes (Signed)
Peripheral Nerve Block    Patient location during procedure: OR  Reason for block: Post-op pain management      Injection technique: Single Shot  Block Region: Interscalene  Laterality: Right      Block at surgeon's request Yes    Start time: 07/01/2022 7:45 AM  End time: 07/01/2022 7:50 AM      Staffing  Anesthesiologist: Feliberto Gottron, DO  Performed: Anesthesiologist       Pre-procedure Checklist   Completed: patient identified, surgical consent, pre-op evaluation, timeout performed, risks and benefits discussed, anesthesia consent given and correct site  Timeout Completed:  07/01/2022 7:45 AM      Peripheral Block  Patient monitoring: Pulse oximetry, EKG and NIBP  Patient position: Left lateral decubitus  Premedication: Yes and Meaningful contact maintained  Local infiltration: Lidocaine 2%      Needle  Needle type: Stim needle   Needle gauge: 22 G  Needle length: 2 in      Guidance: ultrasound guided  Ultrasound Guided: LA spread visualized, Needle visualized, Relevant anatomy identified (nerve, vessels, muscle) and Image stored or printed          Assessment   Incremental injection: yes  Injection made incrementally with aspirations every 5 mL.  Injection Resistance: no  Paresthesia Pain: No    Blood Aspirated: No  no suspected intravascular injection  Patient tolerated procedure well: Yes  Block Outcome: No complications and Successful block

## 2022-07-01 NOTE — Transition of Care (Signed)
Received patient from PACU. AAOx4, ambulated to bathroom standby assist. Oriented to unit, call light within reach.

## 2022-07-01 NOTE — Transition of Care (Signed)
FOUR EYES SKIN ASSESSMENT NOTE    Claudia Adams  [birth year]  [id number]    Braden Scale Score: 20    POC Initiated for Risk for Altered Skin: No      Mepilex Dressing Applied to sacrum/heel if any PI risk factors present: No      If Wound / Pressure Injury Present:    Wound / PI Documented on Patient Avatar No      Wound / PI assessment documented in Flowsheet: No      Admitting Physician notified: No      Wound Consult ordered: No      Carolynne Edouard, RN  July 01, 2022  5:20 PM    Second RN Name: Adelina Mings, RN

## 2022-07-01 NOTE — Op Note (Signed)
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, right shoulder, M75.121 Complete rotator cuff tear or rupture of    right shoulder, not specified as traumatic, M75.21 Bicipital tendinitis, right shoulder, S46.191A    Other injury of muscle, fascia and tendon of long head of biceps, right arm, initial encounter, E11.9    Type 2 diabetes mellitus without complications, I10 Essential (primary) hypertension, M06.4    Inflammatory polyarthropathy      Procedure Codes:  23430 59 RT TENODESIS LONG TENDON BICEPS, 23430 59 AS RT TENODESIS LONG TENDON    BICEPS, 23472 RT ARTHROPLASTY GLENOHUMERAL JOINT TOTAL SHOULDER, 14782 AS RT    ARTHROPLASTY GLENOHUMERAL JOINT TOTAL SHOULDER      Surgical Procedure:right reverse total shoulder arthroplasty   right shoelder open biceps tenodesis Surgeon: Leeroy Cha, MD Assistant: Gilmer Mor OTC, ATC., Nadara Mustard, MD: A certified Orthopaedic Technologist as a     Surgical First Assistant was required for positioning, sterile draping, tourniquet application, tool preparation, stabilization and manipulation of the limb while accessing and operating. This was necessary due to the complexity of the procedure. An alternative qualified first assistant was unavailable to assist with this case.     Anesthesia: General/regional w/ Exparel [id number] by Cloyde Reams, DO EBL: 100cc Implants: manufacturer: LIMA   stem 14mm press Fit, polyethlene-standard, glenoid Baseplate-TT MB 7degree with medium peg and 4 screws glenosphere 36mm eccentric, Synthes cable with clip     Specimens: None Tourniquet Time: 0 Minutes 250 mmHG Complications: calcar  fracture that was reduced and stabilized with cable fixation Condition: The patient was transported to the recovery room in stable condition.     Indications:  Claudia Adams presented to the office complaining of shoulder pain that was persisted despite the use of multiple conservative/nonsurgical measures. Her history, physical examination and imaging studies were consistent with rotator cuff arthropathy. I discussed nonsurgical and surgical treatment options. Our discussion included but was not limited to a review of prosthetic replacement, standard vs reverse shoulder arthroplasty, rerupture of any repairs, stiffness, fracture, dislocation, infection, bleeding, deep vein thrombosis, pulmonary embolism, neurovascular injury and the potential need for revision surgery. We discussed the importance of compliance with physical therapy and restrictions after surgery including protected weightbearing, the use of assistive devices, braces, shoulder immobilizers and other durable medical equipment. We     Patient Name: [name], [name] DOB: 26-Feb-1946   discussed the risk of recurrence of pain despite surgery. We discussed potential unforeseen complications. We discussed the low but potential risk of catostrophic complication. I gave Claudia Adams an opportunity to ask questions and I answered all questions. After a thorough discussion of the risks and benefits of all treatment options, she provided informed consent for reconstructive shoulder surgery/arthroplasty.     Examination Under Examination Under Anesthesia: shoulder: Anesthesia:   Observation: There was no effusion   Range Of Motion: Flexion 140-abduction 120-external rotation 25, internal rotation L5 , ABER   30 , ABIR 20  Special testing/Provocative maneuvers:   Load and Shift: 0 anterior translation, 0 posterior translation, 0 inferior translation rear translation   There was no evidence of any instability.     Operative Summary:    Claudia Adams was properly identified in the holding area. The shoulder was marked with my   initials. She was then transferred to the operating room where an appropriate timeout was performed confirming the correct patient, correct operative limb, the correct procedure and the correct equipment. Antibiotics were administered within 60 minutes before making any incisions. She was positioned into the beach chair position. Sequential compression devices were applied to the lower extremities. The upper extremity was then prepped and draped in standard sterile fashion from the neck to the wrist   An deltopectoral incision was made over the delto-pectoral groove on the anterior shoulder from the coracoid process to the deltoid muscle insertion onto the humerus. The skin and subcutaneous tissues were divided sharply. Bovie cautery was used to mobilize the cephalic vein medially. Several branches off of the cephalic vein were coagulated using bovie cautery to allow for mobilization. The deltopectoral interval was divided bluntly. The clavipectoral fascia was divided sharply immediately lateral to the conjoined tendon and proximally to the coracoid process attachment. A partial release of the conjoined tendon was performed at its insertion onto the coracoid process to allow for visualization. The pectoralis major tendon was partially released form its insertion onto the humerus using Bovie cautery to allow for visualization. The deltoid tendon was partially released form its insertion onto the humerus using Bovie cautery to allow for visualization. The subacromial bursa was excised and its adhesions to the deltoid muscle were release using Bovie cautery. The lateral branch of the axillary nerve was identified and protected. Bovie cautery was used to section the transverse bicipital ligament within the bicipital groove. This incision was extended proximally through the rotator interval and then medially to the coracoid process.  The coracohumeral ligament was released using bovie cautery. The axillary nerve was identified at the inferior border of the subscapularis muscle and protected. Bovie cautery was used to section and obtain hemostasis of the anterior branches of the humeral circumflex vessels. The subscapularis tendon was released from its insertion onto the lesser tuberosity and mobilized laterally. Adhesions on the articular and coracoid/bursal side were released using Bovie cautery. An anterior and inferior capsular release was performed on the humeral side . This included release of the superior, middle, anterior band of the inferior glenohumeral ligaments and the inferior capsule from their humeral attachments. An anterior capsulectomy was perfomed excising the superior, middle and anterior band of the inferior glenohumeral ligaments. The labrum was excised circumferentially. The tricep insertion onto the inferior glenoid neck was exposed and identified for orientation.     Preliminary Humerus Preparation:   The humerus was adducted, extended and externally rotated to dislocate and reveal the humeral head. The surrounding soft tissue structure were protected with humeral retractors. The chronic, retracted tear of the rotator cuff tendon was debrided. An extramedullary cutting guide was secured with a tacking pin in 30 degrees of retroversion onto the anterior humerus . An oscillating saw was used to remove a portion of th articular surface of the humeral head. A canal finder was used to identify the intramedullary canal. The canal broached by hand with the assistance of a mallet with broaches m to size 14mm. The trial broach was left in place. The proximal humerus was then prepared for the humeral socket by  reaming with socket reamers of sequentially increasing size from smallest to largest.     Glenoid Preparation: Glenoid retractors were placed circumferentally to protect the adjacent soft tissue and neurovascular stuctures. A  standard glenosphere guide was placed on the inferior face of the glenoid. A drill bit was advanced though the guide creating a pilot hole inclined 15 degrees of cranially. The guide was removed and the pilot hole tapped with a peg reamer. The guidepin was left in place. The glenoid surface was then gently reamed under power with reamers of sequentially increasing size from smallest to largest. The reamers were removed. The glenosphere base plate was then press fit into the peg hole and advanced down onto the glenoid. Four screws of appropriate length ,as determined by a depth guage, were then inserted into the glenoid through the 4 screw holes in the baseplate. Stability of the fixation was checked and noted to be excellent. The glenosphere was then press fit onto the baseplate with the assistance of a mallet. Stability of the glenosphere baseplate interface was conformed with push-pull testing. The glenosphere coupling screw was then securely inserted with a torque wrench. The wound was then copiously irrigtaed with antibiotic impregnated normal saline solution.   Final Humerus Preparation Several sizes of glenosphere/humeral cup components were trialed to optimize stability and range of motion. When the appropriate size was identified, the trial humeral broach was removed with a broach handle. Drill holes were placed into the lesser tuberosity in a longitudinal manner and filled with nonabsorbable sutures in preparation for the subscapularis repair. A cement restrictor was press fit distally into the humeral canal. The canal was then copiously irrigtaed with antibiotic impregnated normal saline solution. The ca fully assembled humeral stem/cup component was press fit into the canal.   A minimally displaced calcar fracture develope during insertion of the hemeral component. The fracture was reduced and stbilized with a circumferential cable that was tension and fixed with a crimp climp around the proximal humerus  metaphysis.   The wound was then copiously irrigtaed with antibiotic impregnated normal saline solution and the components reduced to an anatomic position.   The subscapularis was repaired with nonabsorbable suture passed through drill holes in the lesser tuberosity. The components and subscapularis repair were checked for stability and noted to be secure through the following range of motion:   FFLEX 120 ABD 120 AddER 30 ABER 45 ABIR 40      Biceps tenodesis: Tenodesis long head of the biceps tendon to the sunscpularis tendon repair site was performed using #2 nonabsorbable suture   The deltopectoral interval was reapproximated with #2 ethibond suture in an interupted manner. The subcutanous layer was closed with 0 vicryl suture in an interupted manner. The deep dermal layer was closed with 2.0 vicryl suture in an interupted manner. The skin was closed with 3.0 monocryl suture in anrunning subcuticular manner. Benzoin and steri strips were applied to the wound followed by betadine soaked adaptic gauze and dry sterile dressings. The shoulder was strapped with a shoulder immobilizer and the cold therapy device in order to provide additional pain relief and to control the postoperative effusion.   ERSILIA BRAWLEY was then removed from the operating room table. She was transferred to the recovery room where she was noted to be in stable She He tolerated the procedure without complication.     Electronically signed by: Leeroy Cha, MD Date: 07/01/2022 Time: 11:08 AM

## 2022-07-01 NOTE — Anesthesia Postprocedure Evaluation (Signed)
Anesthesia Post Evaluation    Patient: Claudia Adams    Procedure(s):  ARTHROPLASTY, SHOULDER REVERSE    Anesthesia type: general    Last Vitals:   Vitals Value Taken Time   BP 128/51 07/01/22 1130   Temp 97.7 07/01/22 1134   Pulse 72 07/01/22 1134   Resp 23 07/01/22 1134   SpO2 94 % 07/01/22 1134   Vitals shown include unvalidated device data.              Anesthesia Post Evaluation:     Patient Evaluated: PACU  Patient Participation: complete - patient participated  Level of Consciousness: awake and alert  Pain Score: 0  Pain Management: adequate  Multimodal analgesia pain management approach  Strategies: regional block and steroids  Airway Patency: patent  Two or more mitigation strategies used for obstructive sleep apnea.  Strategies: verification of full NMB reversal, awake extubation and extubation/recovery carried out in nonsupine position    Anesthetic complications: No      PONV Status: none    Cardiovascular status: acceptable  Respiratory status: acceptable  Hydration status: acceptable          Signed by: Feliberto Gottron, DO, 07/01/2022 11:34 AM

## 2022-07-01 NOTE — H&P (Addendum)
Patient Name: Claudia Adams Encounter Date: 04/14/2022 Date of Birth: Jun 29, 1946     Chief Complaint Bilateral shoulder pain (right>left)     History of Present Illness   Claudia Adams is a 76 year old right hand dominant female with insulin-dependent diabetes mellitus and rheumatoid arthritis complaining of chronic right shoulder pain for decades in duration. She is status post rotator cuff repair by different surgeon in [place] in the distant past. It has been slowly worsening since 2017 without acute injury. She describes the pain as sharp in quality, intermittent in nature, worse with reaching/overhead activities of daily living and persistent despite NSAIDs, rest, activity modification, physical therapy, and corticosteroid injection as recently as May 2023. The pain is posterolateral and anterior in location and worse with reaching and overhead activities. She has pain at night that interferes with her ability to sleep. She rates the pain as 5/10 on a visual analog scale. She denies associated numbness, tingling, fever, sweats or chills.   Claudia Adams also complains of left shoulder pain for decades in duration. She is status post rotator cuff repair by different surgeon in [place] in the distant past. It has been slowly worsening since 2018 without acute injury. She describes the pain as sharp in quality, intermittent in nature, worse with reaching/overhead activities of daily living and persistent despite NSAIDs, rest, activity modification and physical therapy, . The pain is posterolateral and anterior in location and worse with reaching and overhead activities. She has pain at night that interferes with her ability to sleep. She rates the pain as 2/10 on a visual analog scale. She denies associated numbness, tingling, fever, sweats or chills.   Date of Onset: 2017   Medical History   Medical Conditions: Diabetes -Type II (E11 ), Hypertension (I10 ), High Cholestrol, Rheumatoid Arthritis  (M06.80 ),   Back Pain, Hyperthyroidism (E05 )     Surgical History: Cataract (eye) surgery; Right total knee replacement; . 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   Current Medications: potassium chloride ER 10 mEq tablet,extended release(part/cryst) TbTQ oral, rosuvastatin 20   mg tablet oral, Ozempic 2 mg/dose (8 mg/3 mL) subcutaneous pen injector pnij subQ, trazodone 50 mg tablet oral,     prednisone 5 mg tablet oral, metoprolol succinate ER 50 mg tablet,extended release 24 hr Tb24 oral, levothyroxine 25   mcg tablet oral, gabapentin 300 mg capsule oral, folic acid 1 mg tablet oral     Medication and Allergic Reactions: penicilin, seasonal allergies   Family History: Family History of: Congestive Heart Failure , Rheumatoid Arthritis and heart attack.   Personal and Social History: The patient does not smoke. The patient currently does not consume alcohol. The   patient indicates has not abused alcohol. indicates never used illicit drugs. The patient indicates has not abused illicit   drugs in the past. She is widowed.      Review of Systems   Constitutional: The patient denies any unplanned weight loss, loss of appetite or fatigue.   Eyes: The patient denies any blurred vision, double vision or vision loss.   Ear/Nose/Throat: The patient denies any hearing loss, hoarseness or trouble swallowing.   Cardiovascular: The patient denies any chest pain or palpitations.   Respiratory: The patient denies any chronic coughing, pneumonia or shortness or breath.   Gastrointestinal: Patient denies heartburn, nausea or blood in stool.   Genitourinary: The patient denies any painful urination, blood in urine or kidney problems.   Skin: The patient denies any rashes, skin  ulcers, lumps or psoriasis.   Neurological: The patient denies any frequent falls, loss of coordination, numbness, dizziness, change in bowel and   bladder function.   Psychiatric: The patient denies any depression, drug or alcohol addiction or sleeping  disorders.   Endocrine: The patient denies any fever, heat/cold intolerance or night sweats.   Hematological: The patient denies any bleeding problems, easy bruising or anemia.     Vital Signs: Height 61ft 1.00in Weight 131lbs Temp BMI 24.75   General Exam:   Constitutional: Is a well-developed, well-nourished female in no acute distress.   Skin: There were no active skin lesions on the head, neck, face, torso, bilateral lower extremities. She had no peripheral   edema, no varicosities. Her skin is warm and dry.   Mental Status: Alert and oriented x 3 with an appropriate mood and affect.   Vascular: No peripheral edema. Peripheral pulses are palpable and 2+.   Neurological: The patient has good coordination. There is no weakness or sensory deficit. Deep tendon reflexes are   intact.     Right Shoulder Examination   Observations: The shoulder has no muscle atrophy or bony asymmetry. There are healed incisions     Palpation: There is no tenderness at the [facility] joint. There is no tenderness at the bicipital groove. There is no tenderness   at the posterior joint line.     Active Motion:   Forward flexion 120    Abduction: 100    External Rotation: 40   Internal Rotation: Back pocket   Scapular dyskinesis: Positive.     Passive Motion:   Forward flexion 120    Abduction: 100    External Rotation: 40    Internal Rotation: Back pocket   ABER: 50   ABIR: 10     Strength: Deltoid 4/5, Supraspinatus 3/5, Infraspinatus /5, Subscapularis . /5     Provocative Maneuvers:   Painful arc: Positive   External Rotation Lag Sign: Negative   Speed Test: Negative   Yergason's Test : negative   Belly Press test/ abdominal compression test: Negative.   Bear Hugger Test: Negative   Cross chest adduction pain testing: Negative.   O'Brien's maneuver/adduction compression test : Equivocal.   Thrower's Position/Three Pack: Negative   Dynamic Labral Sheer Test: Negative   Horn Blowers: Negative   Sulcus Testing Upright: Negative   Neer's  impingement maneuver: Positive.   Hawkins impingement maneuver: Positive   Liftoff test: Negative   Empty can: Positive   Full can: Positive     Left Shoulder Examination   Observations: The shoulder has no muscle atrophy or bony asymmetry.     Palpation: There is no tenderness at the [facility] joint. There is no tenderness at the bicipital groove. There is no tenderness   at the posterior joint line.     Active Motion:   Forward flexion 120   Abduction: 100    External Rotation: 40   Internal Rotation: Back pocket   Scapular dyskinesis: Positive.     Passive Motion:   Forward flexion 120   Abduction: 100   External Rotation: 40    Internal Rotation: Back pocket   ABER: 50   ABIR: 10   Strength: Deltoid 4/5, Supraspinatus ./5, Infraspinatus ./5, Subscapularis /5   Provocative Maneuvers: Painful arc: Positive External Rotation Lag Sign: Negative Speed Test: Negative Yergason's Test : negative Belly Press test/ abdominal compression test: Negative. Bear Hugger Test: Negative Cross chest adduction pain testing: Negative. O'Brien's maneuver/adduction compression test :  Equivocal. Thrower's Position/Three Pack: Negative Dynamic Labral Sheer Test: Negative Horn Blowers: Negative Sulcus Testing Upright: Negative Neer's impingement maneuver: Positive. Hawkins impingement maneuver: Positive Liftoff test: Negative Empty can: Positive Full can: Positive     Cervical Spine Examination   Observations: Attitude and posture of her head and neck are abnormal with trapezius muscle spasm   Palpation: Palpation along anterior/posterior midline, lateral facet joints, and musculature reveals tenderness.   Motion: Decreased active flexion, extension, rotation, and lateral bending of the neck.   Motor: 5/5 all following groups -Grip (Median), Intrinsics-C8/T1 (Ulnar), Wrist Flexors -C7 (Median and Ulnar) , Wrist Extensors C-6 (Radial), Biceps-C5/C6 (Musculocutaneous), Triceps C7 (Radial), C-5 (Axillary)   Sensation: Normal for all  dermatomes -C5-lateral shoulder and proximal arm, C6 -thumb/index finger/lateral forearm, C7 -middle finger, C8 -4th and 5th fingers, medial forearm, T1/T2 -medial arm   Reflexes: Normal Triceps (C7), brachioradialis (C6), biceps (C5/C6)   Provacative Maneuvers: Spurlings manuever: Equivocal Compression Test: Equivocal Distraction Test: Negative Hoffmans Test: Negative Clonus testing: Negative   Systemic Examination   Poor balance     Diagnostic Test Findings: 04/06/2022,EXAM: CT RIGHT SHOULDER W/O CONTRAST IMPRESSION:   1.CT images obtained using [employer] protocol for planning purposes prior to total shoulder replacement   2. Degenerative changes of the right shoulder Previous Diagnostic Tests: 03/23/2022, EXAM: CERVICAL SPINE X RAY -3 views FINDINGS: 3 views of the cervical spine AP, Lateral and Odontoid views reveal no fracture, no dislocation, no acute bony abnormality. There is multi-level disc space narrowing with facet arthropathy at levels C4-5, C5-6, and C6-7. There is no .loss of normal lordosis IMPRESSION C4-5, C5-6, and C6-7 degenerative disc disease (A permanent record is retained in the patients chart)       03/23/2022, EXAM: Right SHOULDER-4 VIEWS IMPINGEMENT SERIES FINDINGS: 4 views of the Right shoulder including AP Grashey view in internal rotation and external rotation, an axillary view and a supraspinatus outlet view reveal no evidence of fracture, dislocation or subluxation. There is suture anchor artifact in the greater tuberosity consistent with prior rotator cuff repair. There is a type 3 acromion. There is elevation of the humeral head with narrowing of the acromio-humeral interval consistent with a chronic rotator cuff tear. There is acetabularization of the acromion with osteophyte formation consistent with rotator cuff arthropathy. There is post IMPRESSION : Rotator cuff arthropathy. Type II acromion. Evidence of prior history of a rotator cuff repair with intact anchors and hardware (A  permanent record is retained in the patients chart)   03/23/2022, EXAM: Left SHOULDER X-RAYS -4 VIEWS IMPINGEMENT SERIES FINDINGS: 4 views of the left shoulder including AP Grashey view in internal rotation and external rotation, an axillary view and a supraspinatus outlet view reveal no evidence of fracture, dislocation or subluxation. There is suture anchor artifact in the greater tuberosity consistent with prior rotator cuff repair. There is a type 3 acromion. There is elevation of the humeral head with narrowing of the acromio-humeral interval consistent with a chronic rotator cuff tear. There is acetabularization of the acromion with osteophyte formation consistent with rotator cuff arthropathy. There is post IMPRESSION : Rotator cuff arthropathy. Type II acromion. Evidence of prior history of a rotator cuff repair with intact anchors and hardware   Data Reviewed: Reviewed Right Shoulder CT Scan results.     Assessment and Plan: Diagnosis Codes:   M13.811 Other specified arthritis, right shoulder, M75.121 Complete rotator cuff tear or rupture of right shoulder, not specified as traumatic, M75.21 Bicipital tendinitis, right shoulder, Z61.096E  Other injury of muscle, fascia and tendon of long head of biceps, right arm, initial encounter, M75.122 Complete rotator cuff tear or rupture of left shoulder, not specified as traumatic, U98.119J Other injury of muscle, fascia and tendon of long head of biceps, left arm, initial encounter, Y78.295 Other specified arthritis, left shoulder, M54.2 Cervicalgia, M47.892 Other spondylosis, cervical   region, E11.9 Type 2 diabetes mellitus without complications, I10 Essential (primary) hypertension, 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 She has persistent pain despite NSAIDs,  activity modification, corticosteroid injection and physical therapy. We reviewed all of the above corresponding data together including radiographic studies. We discussed nonsurgical and surgical treatment options including risks and benefits. Based on her failure to obtain satisfactory results with multiple nonsurgical measures she prefers a surgical solution. We discussed reverse shoulder arthroplasty at length including the risks and benefits. I specifically discussed reverse total shoulder arthroplasty. I reviewed the elevated risk of infection associated with serial corticosteroid injections prior to arthroplasty. I gave her an opportunity to ask questions and I answered all of her questions for her today. After a thorough discussion of risks and benefits She wishes to proceed with total shoulder arthroplasty and will schedule the procedure electively. I have encouraged her to call the office with any questions or concerns prior to her next visit.     RISKS AND BENEFITS DISCUSSION SHOULDER SURGERY Today [name] and I discussed surgical treatment due to failure of and/or dissatisfaction with conservative nonsurgical management. We discussed the risks and benefits of conservative treatment versus surgical intervention. Our discussion included but was not limited to infection, bleeding, DVT, pulmonary embolus, tendon/liagament re-rupture, post operative stiffness/contracture, persistent pain, arthroscopic surgery, open surgery, treatment of concomitant shoulder and upper extremity pathology, inpatient surgery, outpatient surgery, regional anaesthesia, general anesthesia, hardware complications, neurovascular injury and potential reoperation. We discussed the importance of compliance with post-operative restrictions, shoulder immobilizer, sling, braces, splints, casts, protected weightbearing, physical therapy, and post operative activity restrictions. We also discussed the possibility of failure to obtain  the desired result and potential catastrophic complications including but not limited to paralysis, amputation and death. After a thorough discussion of the risks and benefits, Jenella Craigie agreed to proceed with shoulder surgery. Sharnette completed a pre-surgical questionnaire with the Athletic Trainer and Physician to identify potential risk factors for surgery. Maie was referred to her primary physician for a pre-operative history and physicial exam. The following employees were involved in the care of this patient; ATC Lynnell Dike;

## 2022-07-01 NOTE — Consults (Signed)
CONSULTATION NOTE    Date Time: 07/01/22 11:10 AM  Patient Name: Claudia Adams  Requesting Physician: Leeroy Cha, MD      Reason for Consultation:   Medical management    Assessment and Plan:     Type 2 DM: Home Ozempic on hold perioperatively, non formulary in hospital. SSI ordered. Will monitor BG with FS Accu-check ACHS and monitor for signs of hypoglycemia. Hypoglycemia orders in place per protocol. Follows with PCP/Endo as outpatient.    Hypertension: Will continue antihypertensives as per home regimen. IV hydralazine ordered as needed. Will monitor vital signs.     Hyperlipidemia: Continue statin. Follows with PCP outpatient.     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 Laural Benes. Will use oral pain medications for pain control while monitoring carefully for sedation and respiratory depression. SCDs for deep vein thrombosis prophylaxis. Pain is currently controlled. Side effect of pain medications including constipation, sedation and habituation. Discussed with patient. Stool softeners being given to avoid constipation. Encourage incentive spirometry. Early physical therapy and occupational therapy per orthopedics. Have also discussed benefits and side effects of anticoagulation, including but not limited to gastrointestinal bleed and intracranial bleed. Patient verbalized understanding.    CKD III: Chronic, stable. Baseline creat 1.1 per pre-op labs. Will monitor, check Am BMP. Will consult nephrology PRN, follows with PCP as outpatient.    Hypothyroidism: Chronic, stable. Follows with PCP/Endo as outpatient. Will continue medication per home regimen.     Neuropathy: Chronic, stable. Follows with PCP as outpatient. Will continue medication per home regimen.     Rheumatoid Arthritis: Chronic, stable. Follows with PCP/Rheum as outpatient. Home medications on hold at this time, can resume at discharge pending Ortho recommendation. Recommend  continued outpatient follow up for monitoring/management.     Anxiety: Chronic, stable. Will continue medications per home regimen. Will monitor for needs during hospital stay. Follows with PCP as outpatient.     Seasonal Allergies: Chronic, stable. No evidence of acute exacerbation, lungs CTA on assessment. Will continue medications per home regimen. Follows with PCP as outpatient.      Thank you Dr. Laural Benes for consulting [place] on this very pleasant patient. We will continue to follow with you on this admission.      DVT prophylaxis: SCDs    History:   Claudia Adams is a 76 y.o. female who presents to the hospital on 07/01/2022 with the chief complaint of shoulder pain  past medical history consistent of anxiety, T2DM, hypothyroidism, hypertension, hyperlipidemia, neuropathy, rheumatoid arthritis, CKD III, seasonal allergies, and osteoarthritis.   Chief complaints-knee pain  Patient noted to have right shoulder pain gradually worsening, worse with mobility an difficulty with ADLs secondary to pain. Failed conservative outpatient management. Patient now s/p right reverse shoulder arthroplasty by Dr. Laural Benes today. Patient evaluated in PACU.  Had been complaining of pain in right shoulder. Pain was continuous , progressively worsening over the last few months, failed conservative management, worse with movements, relieved only with pain medications. Decreased mobility secondary to above. No nausea. Denies any vomiting or dizziness now. Denies any chest pain. Vitals are stable. O2 sats are stable. No focal neurological deficits.   No chest pain, no PND no orthopnea. No palpitations. No headache, no loss of consciousness, no seizures, no falls, no trauma. Vision, speech and swallow have remained normal. No abdominal pain. Urine and stool habits have been normal. Appetite and weight has been normal.      Past Medical History:  Past Medical History:   Diagnosis Date    Anemia     H/H 11.2 / 33 06/17/22    Anxiety      SITUATIONAL    Diabetes mellitus     Disorder of thyroid     Ear, nose and throat disorder     ALLERGIES    Hyperlipidemia     Hypertension     Hypothyroidism     MANAGED BY ENDOCRINOL- STABLE    Neuropathy     CONTROLLED WITH GABAPENTIN    Other specified arthritis, right shoulder     PREOP DIAGNOSIS    Renal insufficiency     CKD 3 MONITORED BY PCP. CREAT 1.1 06/17/22    Rheumatoid arthritis     DR Kipp Brood- LAST SEEN FOE REMICADE 04/2022    Seasonal allergic rhinitis     ALLEGRA    Type 2 diabetes mellitus, controlled     MANAGED BY ENDOCRINOL NOTE 02/15/22 IN NOTES.  AM FBS 130-160. HA1C 02/15/22 6.2       Past Surgical History:     Past Surgical History:   Procedure Laterality Date    EYE SURGERY      CATARCTS    FINGER SURGERY Right     INDEX    HYSTERECTOMY      KNEE SURGERY Right 11/30/2019    knee replacement by Dr. Harlene Salts    ROTATOR CUFF REPAIR Left     SHOULDER SURGERY Right 2019    Rotator cuff    SHOULDER SURGERY Right     PUT BACK IN SOCKET       Family History:     Family History   Problem Relation Age of Onset    No known problems Mother     No known problems Father        Social History:     Social History     Socioeconomic History    Marital status: Widowed     Spouse name: Not on file    Number of children: Not on file    Years of education: Not on file    Highest education level: Not on file   Occupational History    Not on file   Tobacco Use    Smoking status: Never    Smokeless tobacco: Never   Vaping Use    Vaping Use: Never used   Substance and Sexual Activity    Alcohol use: Never    Drug use: Yes     Types: Marijuana     Comment: Gummie edibles for pain and anxiety-WILL NOT TAKE 7 DAYS BEFORE DOS    Sexual activity: Not on file   Other Topics Concern    Not on file   Social History Narrative    Not on file     Social Determinants of Health     Financial Resource Strain: Not on file   Food Insecurity: Not on file   Transportation Needs: Not on file   Physical Activity: Not on file    Stress: Not on file   Social Connections: Not on file   Intimate Partner Violence: Not on file   Housing Stability: Not on file       Allergies:     Allergies   Allergen Reactions    Penicillin G      Reports allergic as a child         Medications:     Current Facility-Administered Medications   Medication Dose Route Frequency  Review of Systems:     Constitutional: Negative for chills, weight loss, malaise/fatigue and diaphoresis.   HENT: Negative for hearing loss, ear pain, nosebleeds, congestion, sore throat, neck pain, tinnitus and ear discharge.    Eyes: Negative for blurred vision, double vision, photophobia, pain, discharge and redness.   Respiratory: Negative for cough, hemoptysis, sputum production, shortness of breath, wheezing and stridor.    Cardiovascular: Negative for chest pain, palpitations, orthopnea, claudication, leg swelling and PND.   Gastrointestinal: Negative for heartburn, nausea, vomiting, abdominal pain, diarrhea, constipation, blood in stool and melena.   Genitourinary: Negative for dysuria, urgency, frequency, hematuria and flank pain.   Musculoskeletal: Negative for myalgias, back pain, and falls. + Right shoulder pain  Skin: Negative for itching and rash.   Neurological: Negative for dizziness, tingling, tremors, sensory change, speech change, focal weakness, seizures, loss of consciousness, weakness and headaches.   Endo/Heme/Allergies: Negative for environmental allergies and polydipsia. Does not bruise/bleed easily.   Psychiatric/Behavioral: Negative for depression, suicidal ideas, hallucinations, memory loss and substance abuse. The patient is not nervous/anxious and does not have insomnia.      Physical Exam:     Vitals:    07/01/22 0621   BP: 156/76   Pulse: (!) 48   Resp: 18   Temp: 97.3 F (36.3 C)   SpO2: 99%       Intake and Output Summary (Last 24 hours) at Date Time    Intake/Output Summary (Last 24 hours) at 07/01/2022 1110  Last data filed at 07/01/2022  1110  Gross per 24 hour   Intake 1100 ml   Output --   Net 1100 ml       Constitutional: Patient is oriented to person, place, and time. Patient appears well-developed and well-nourished.   Head: Normocephalic and atraumatic.  Eyes- pupils equal and reactive, extraocular eye movements intact, sclera anicteric  Ears - external ear canals normal, right ear normal, left ear normal  Nose - normal and patent, no erythema,   Mouth - mucous membranes moist, pharynx normal without lesions  Neck: Normal range of motion. Neck supple. No JVD present.   Cardiovascular: Normal rate, regular rhythm, normal heart sounds and intact distal pulses.  Exam reveals no gallop and no friction rub. No murmur heard.  Pulmonary/Chest: Effort normal and breath sounds normal. No stridor. No respiratory distress. Patient has no wheezes. No rales were present.  Exhibits no tenderness.   Abdominal: Soft. Bowel sounds are normal. Patient exhibits no distension and no mass was palpable. There is no tenderness. There is no rebound and no guarding.   Musculoskeletal: Decreased ROM to RUE consistent with postoperative state, sling in place. Patient exhibits no edema and no tenderness.   Lymphadenopathy:  Patient has no cervical adenopathy.   Neurological: Patient is alert and oriented to person, place, and time and has normal reflexes. No cranial nerve deficit.  Normal muscle tone. Coordination normal.   Skin: Skin is warm. No rash noted. Patient is not diaphoretic. No erythema. No pallor. + RUE surgical dressing CDI  Psychiatric: Has normal mood and affect. Behavior is normal. Judgment and thought content normal.    Labs Reviewed:     Results       Procedure Component Value Units Date/Time    Glucose Whole Blood - POCT [[id number]]  (Abnormal) Collected: 07/01/22 0633     Updated: 07/01/22 0637     Whole Blood Glucose POCT 167 mg/dL  Rads:   Radiological Procedure reviewed.     Time spent during patient care: 76 minutes    Patient care time  was spent personally by me on the following activities: Blood draw for specimens, development of treatment plan with patient or surrogate, discussions with consultants, examination of patient, re-evaluation of patient's condition, pulse oximetry, ordering and review of radiographic studies, ordering and review of laboratory studies and ordering and performing treatments and interventions.     Signed by: Otilio Jefferson, FNP

## 2022-07-01 NOTE — Transfer of Care (Signed)
Anesthesia Transfer of Care Note    Patient: ILANA PREZIOSO    Procedures performed: Procedure(s):  ARTHROPLASTY, SHOULDER REVERSE    Anesthesia type: General ETT    Patient location:Phase I PACU    Last vitals:   128/57 HR 73 sat 94 resp 20 temp 97.7    Post pain: Patient not complaining of pain, continue current therapy      Mental Status:awake    Respiratory Function: tolerating room air    Cardiovascular: stable    Nausea/Vomiting: patient not complaining of nausea or vomiting    Hydration Status: adequate    Post assessment: no apparent anesthetic complications    Signed by: Feliberto Gottron, DO  07/01/22 11:32 AM

## 2022-07-01 NOTE — PACU (Signed)
OOB ambulatory with assistance to BR.  Voided without difficulty.

## 2022-07-01 NOTE — Plan of Care (Signed)
Problem: Moderate/High Fall Risk Score >5  Goal: Patient will remain free of falls  Outcome: Progressing  Flowsheets (Taken 07/01/2022 1500)  High (Greater than 13): HIGH-Bed alarm on at all times while patient in bed     Problem: Safety  Goal: Patient will be free from injury during hospitalization  Outcome: Progressing  Flowsheets (Taken 07/01/2022 1655)  Patient will be free from injury during hospitalization:   Assess patient's risk for falls and implement fall prevention plan of care per policy   Use appropriate transfer methods   Provide and maintain safe environment     Problem: Safety  Goal: Patient will be free from infection during hospitalization  Outcome: Progressing  Flowsheets (Taken 07/01/2022 1655)  Free from Infection during hospitalization:   Assess and monitor for signs and symptoms of infection   Monitor all insertion sites (i.e. indwelling lines, tubes, urinary catheters, and drains)   Monitor lab/diagnostic results     Problem: Pain  Goal: Pain at adequate level as identified by patient  Outcome: Progressing  Flowsheets (Taken 07/01/2022 1655)  Pain at adequate level as identified by patient:   Assess pain on admission, during daily assessment and/or before any "as needed" intervention(s)   Reassess pain within 30-60 minutes of any procedure/intervention, per Pain Assessment, Intervention, Reassessment (AIR) Cycle   Evaluate if patient comfort function goal is met   Evaluate patient's satisfaction with pain management progress   Assess for risk of opioid induced respiratory depression, including snoring/sleep apnea. Alert healthcare team of risk factors identified.     Problem: Peripheral Neurovascular Impairment  Goal: Extremity color, movement, sensation are maintained or improved  Outcome: Progressing  Flowsheets (Taken 07/01/2022 1655)  Extremity color, movement, sensation are maintained or improved:   Assess extremity for proper alignment   Assess and monitor application of corrective  devices (cast, brace, splint), check skin integrity   Increase mobility as tolerated/progressive mobility     Problem: Nutrition  Goal: Nutritional intake is adequate  Outcome: Progressing  Flowsheets (Taken 07/01/2022 1655)  Nutritional intake is adequate:   Assist patient with meals/food selection   Allow adequate time for meals     Problem: Anxiety  Goal: Anxiety is at a manageable level  Outcome: Progressing  Flowsheets (Taken 07/01/2022 1655)  Anxiety is at a manageable level:   Orient to unit   Assess emotional status and coping mechanisms   Provide and maintain a safe environment

## 2022-07-01 NOTE — PACU (Signed)
Right shoulder immobilizer in place.  Unable to feel right arm, numbness.  Unable to move arm or fingers.  Fingers warm, cap refill less than 3 seconds, strong radial pulse.

## 2022-07-02 LAB — BASIC METABOLIC PANEL
Anion Gap: 6 (ref 5.0–15.0)
BUN: 17 mg/dL (ref 7.0–21.0)
CO2: 27 mEq/L (ref 17–29)
Calcium: 8.2 mg/dL (ref 7.9–10.2)
Chloride: 106 mEq/L (ref 99–111)
Creatinine: 1.1 mg/dL — ABNORMAL HIGH (ref 0.4–1.0)
Glucose: 158 mg/dL — ABNORMAL HIGH (ref 70–100)
Potassium: 3.7 mEq/L (ref 3.5–5.3)
Sodium: 139 mEq/L (ref 135–145)
eGFR: 52 mL/min/{1.73_m2} — AB (ref >=60)

## 2022-07-02 LAB — CBC
Absolute NRBC: 0 10*3/uL (ref 0.00–0.00)
Hematocrit: 24.6 % — ABNORMAL LOW (ref 34.7–43.7)
Hgb: 8.6 g/dL — ABNORMAL LOW (ref 11.4–14.8)
MCH: 31.6 pg (ref 25.1–33.5)
MCHC: 35 g/dL (ref 31.5–35.8)
MCV: 90.4 fL (ref 78.0–96.0)
MPV: 10.7 fL (ref 8.9–12.5)
Nucleated RBC: 0 /100 WBC (ref 0.0–0.0)
Platelets: 109 10*3/uL — ABNORMAL LOW (ref 142–346)
RBC: 2.72 10*6/uL — ABNORMAL LOW (ref 3.90–5.10)
RDW: 13 % (ref 11–15)
WBC: 7.86 10*3/uL (ref 3.10–9.50)

## 2022-07-02 LAB — GLUCOSE WHOLE BLOOD - POCT
Whole Blood Glucose POCT: 154 mg/dL — ABNORMAL HIGH (ref 70–100)
Whole Blood Glucose POCT: 224 mg/dL — ABNORMAL HIGH (ref 70–100)
Whole Blood Glucose POCT: 192 mg/dL — ABNORMAL HIGH (ref 70–100)

## 2022-07-02 MED ORDER — CELECOXIB 100 MG PO CAPS
100.0000 mg | ORAL_CAPSULE | Freq: Two times a day (BID) | ORAL | Status: DC
Start: 2022-07-02 — End: 2022-08-16

## 2022-07-02 MED ORDER — TRAMADOL HCL 50 MG PO TABS
50.0000 mg | ORAL_TABLET | Freq: Four times a day (QID) | ORAL | Status: DC | PRN
Start: 2022-07-02 — End: 2024-03-27

## 2022-07-02 MED ORDER — NALOXONE HCL 4 MG/0.1ML NA LIQD
NASAL | 0 refills | Status: DC
Start: 2022-07-02 — End: 2022-08-16

## 2022-07-02 MED ORDER — OXYCODONE HCL 5 MG PO TABS
5.0000 mg | ORAL_TABLET | Freq: Once | ORAL | Status: AC
Start: 2022-07-02 — End: 2022-07-02
  Administered 2022-07-02: 5 mg via ORAL
  Filled 2022-07-02: qty 1

## 2022-07-02 MED ORDER — ACETAMINOPHEN 325 MG PO TABS
325.0000 mg | ORAL_TABLET | ORAL | Status: AC
Start: 2022-07-02 — End: ?

## 2022-07-02 MED ORDER — DSS 100 MG PO CAPS
100.0000 mg | ORAL_CAPSULE | Freq: Three times a day (TID) | ORAL | Status: DC
Start: 2022-07-02 — End: 2023-12-05

## 2022-07-02 NOTE — Plan of Care (Signed)
Rounding and assessment done, pt doing well, less pain, denied need for pain med, up in a chair, plan to go to rehab when cleared.

## 2022-07-02 NOTE — Plan of Care (Signed)
Problem: Moderate/High Fall Risk Score >5  Goal: Patient will remain free of falls  Outcome: Progressing     Problem: Safety  Goal: Patient will be free from injury during hospitalization  Outcome: Progressing  Goal: Patient will be free from infection during hospitalization  Outcome: Progressing     Problem: Pain  Goal: Pain at adequate level as identified by patient  Outcome: Progressing     Problem: Side Effects from Pain Analgesia  Goal: Patient will experience minimal side effects of analgesic therapy  Outcome: Progressing     Problem: Discharge Barriers  Goal: Patient will be discharged home or other facility with appropriate resources  Outcome: Progressing     Problem: Peripheral Neurovascular Impairment  Goal: Extremity color, movement, sensation are maintained or improved  Outcome: Progressing     Problem: Compromised Hemodynamic Status  Goal: Vital signs and fluid balance maintained/improved  Outcome: Progressing     Problem: Nutrition  Goal: Nutritional intake is adequate  Outcome: Progressing     Problem: Anxiety  Goal: Anxiety is at a manageable level  Outcome: Progressing

## 2022-07-02 NOTE — Discharge Summary (Signed)
DISCHARGE NOTE      Date Time: 07/02/2022  1:55 PM  Patient Name:Claudia Adams  WUX:[id number]  PCP: Gaetano Hawthorne, DNP FNP  Admit Date:07/01/2022  Discharge Date:07/02/2022  Attending Physician:Sandhya Chanda M.D.    Hospital Course:   Please see H&P for complete details of HPI and ROS. The patient was admitted to [facility] and has been diagnosed with the following conditions and has been taken care as mentioned below.    Patient Active Problem List    Diagnosis Date Noted    Osteoarthritis of right shoulder 07/01/2022    Other specified arthritis, right shoulder 06/14/2022    Stage 3a chronic kidney disease 08/13/2021     Secondary to diabetes. Last GFR 53, has been stable so was d/c from nephrology. Nephrology still manages BP      Rheumatoid arthritis 06/23/2020     - First diagnosed about 15 years ago   - Followed now by rheumatology in our area (Dr. Donette Larry)  - Currently on Remicade infusions every 4 weeks  - Also uses diclofenac PRN, hydroxychloroquine daily, methotrexate (six tablets) weekly   -Has chronic fatigue from this      Diabetes mellitus 12/12/2019     Followed by Bynum Bellows, last A1c 07/2021 was 6.5%      Essential hypertension 12/12/2019     - Had a nuclear stress test and carotid doppler in the past which were normal  - Still has plans to follow-up with a cardiologist to establish care      Hyperlipidemia 12/12/2019     Pt takes pitavastatin, rx'd by Dr. Leonie Douglas      Hypothyroidism 12/12/2019     Managed by Bynum Bellows. Last apt 07/2021      Anxiety and depression 12/12/2019     - Has stopped taking Lexapro 20 mg daily and states overall she's feeling well   - Using Trazodone 50 mg nightly as needed  - Also using a CBD OTC gummy vitamin with good relief (doesn't recall which)  - Using Ativan 1 mg PRN and states her panic attacks have recently been better controlled  - Lost her husband a few years ago   - Recently relocated to be closer to family       Polyneuropathy  due to type 2 diabetes mellitus 12/12/2019     Has neuropathy in feet for which she takes Osempic and gabapentin for.       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 No s/s active bleeding noted and VSS. Possibly secondary to ABLA from recent surgery. Stable for discharge.     Thrombocytopenia: PLT 109 this AM. Etiology likely reactionary to recent surgery. VSS, no s/s bleeding noted. Will continue VTE ppx with SCDs. Stable for discharge.     Type 2 DM: Home Ozempic on hold perioperatively, non formulary in hospital. SSI ordered. Fair control. Follows with PCP/Endo as outpatient. Stable for discharge.     Hypertension: With soft BP noted this AM mildly symptomatic with c/o dizziness upon mobility- now resolved. Encouraged PO hydration. Continue antihypertensives as per home regimen with holding parameters. Stable for discharge.     Hyperlipidemia: Continue statin. Follows with PCP outpatient. Stable for discharge.     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 Laural Benes. Will use oral pain medications for pain control while monitoring carefully for sedation  and respiratory depression. SCDs for deep vein thrombosis prophylaxis. Pain is currently controlled. Side effect of pain medications including constipation, sedation and habituation. Discussed with patient. Stool softeners being given to avoid constipation. Encourage incentive spirometry. Early physical therapy and occupational therapy per orthopedics. Have also discussed benefits and side effects of anticoagulation, including but not limited to gastrointestinal bleed and intracranial bleed. Patient verbalized understanding. Stable for discharge to acute rehab per PT/OT recommendation.     CKD III: Chronic, stable. Baseline creat 1.1 per pre-op labs, creat 1.1 this AM. Encouraged PO hydration. Follows with  PCP as outpatient. Stable for discharge.     Hypothyroidism: Chronic, stable. Follows with PCP/Endo as outpatient. Will continue medication per home regimen. Stable for discharge.     Neuropathy: Chronic, stable. Follows with PCP as outpatient. Will continue medication per home regimen. Stable for discharge.     Rheumatoid Arthritis: Chronic, stable. Follows with PCP/Rheum as outpatient. Home medications on hold at this time, can resume at discharge pending Ortho recommendation. Recommend continued outpatient follow up for monitoring/management. Stable for discharge.     Anxiety: Chronic, stable. Will continue medications per home regimen. Follows with PCP as outpatient. Stable for discharge.     Seasonal Allergies: Chronic, stable. No evidence of acute exacerbation, lungs CTA on assessment. Will continue medications per home regimen. Follows with PCP as outpatient. Stable for discharge.    Type of Admission: Hospital Outpatient  Medical Necessity for stay: S/p right reverse shoulder arthroplasty    Date of Admission:   07/01/2022  Date of Discharge:   07/02/2022  Chief Complaint:    Right Shoulder Pain  Discharge Diagnosis:   Hospital Problems:  Principal Problem:    Other specified arthritis, right shoulder  Active Problems:    Rheumatoid arthritis    Osteoarthritis of right shoulder    Lists the present on admission hospital problems  Present on Admission:   Rheumatoid arthritis   Osteoarthritis of right shoulder    Consult Input/Plan   Plan   IP CONSULT TO ANESTHESIOLOGY  IP CONSULT TO HOSPITALIST  IP CONSULT TO SOCIAL WORK  Procedures performed:   No orders of the defined types were placed in this encounter.    Physical Exam:    height is 1.549 m (5' 0.98") and weight is 62.6 kg (137 lb 14.4 oz). Her oral temperature is 98.2 F (36.8 C). Her blood pressure is 108/64 and her pulse is 74. Her respiration is 12 and oxygen saturation is 96%.   Body mass index is 26.07 kg/m.  Vitals:    07/02/22 0026 07/02/22 0450  07/02/22 0800 07/02/22 1146   BP: 106/52 97/58 97/43  108/64   Pulse: 83 79 80 74   Resp: 17 16 12 12    Temp: 99 F (37.2 C) 98.8 F (37.1 C) 98.8 F (37.1 C) 98.2 F (36.8 C)   TempSrc: Oral Oral Oral Oral   SpO2: 93% 94% 95% 96%   Weight:       Height:         Intake and Output Summary (Last 24 hours) at Date Time    Intake/Output Summary (Last 24 hours) at 07/02/2022 1355  Last data filed at 07/02/2022 1300  Gross per 24 hour   Intake 1370 ml   Output 350 ml   Net 1020 ml     Labs:     Results       Procedure Component Value Units Date/Time    Glucose Whole Blood - POCT [[id number]]  (  Abnormal) Collected: 07/02/22 1145     Updated: 07/02/22 1213     Whole Blood Glucose POCT 224 mg/dL     Glucose Whole Blood - POCT [[id number]]  (Abnormal) Collected: 07/02/22 0758     Updated: 07/02/22 0805     Whole Blood Glucose POCT 154 mg/dL     Basic Metabolic Panel [[id number]]  (Abnormal) Collected: 07/02/22 0557    Specimen: Blood Updated: 07/02/22 0658     Glucose 158 mg/dL      BUN 29.5 mg/dL      Creatinine 1.1 mg/dL      Calcium 8.2 mg/dL      Sodium 284 mEq/L      Potassium 3.7 mEq/L      Chloride 106 mEq/L      CO2 27 mEq/L      Anion Gap 6.0     eGFR 52.0 mL/min/1.73 m2     CBC without differential [[id number]]  (Abnormal) Collected: 07/02/22 0557    Specimen: Blood Updated: 07/02/22 0635     WBC 7.86 x10 3/uL      Hgb 8.6 g/dL      Hematocrit 72.5 %      Platelets 109 x10 3/uL      RBC 2.72 x10 6/uL      MCV 90.4 fL      MCH 31.6 pg      MCHC 35.0 g/dL      RDW 13 %      MPV 10.7 fL      Nucleated RBC 0.0 /100 WBC      Absolute NRBC 0.00 x10 3/uL     Glucose Whole Blood - POCT [[id number]]  (Abnormal) Collected: 07/01/22 2056     Updated: 07/01/22 2101     Whole Blood Glucose POCT 277 mg/dL     Glucose Whole Blood - POCT [[id number]]  (Abnormal) Collected: 07/01/22 1658     Updated: 07/01/22 1704     Whole Blood Glucose POCT 210 mg/dL           Rads:   Radiological Procedure reviewed.  XR Shoulder Right 2+  Views    Result Date: 07/01/2022  HISTORY: Right reverse total shoulder arthroplasty for right shoulder pain COMPARISON: None. FINDINGS: There are postsurgical changes of reverse total shoulder arthroplasty. The hardware is in expected position. There is no periprosthetic fracture. There are cerclage wires at the proximal humeral metaphysis. There is overlying soft tissue swelling soft tissue gas. There are overlying skin staples.     Status post reverse total shoulder arthroplasty with expected postoperative changes. Fredrich Birks, MD 07/01/2022 1:58 PM    US GUIDED NERVE BLOCK FOR ANESTHESIA    Result Date: 07/01/2022  An ultrasound-guided nerve block was performed. For details regarding this procedure please refer to the anesthesia record.    Discharge Medications:        Discharge Medication List        Taking      Accu-Chek Guide test strip  Dose: 1 each  What changed: Another medication with the same name was removed. Continue taking this medication, and follow the directions you see here.  Generic drug: glucose blood  1 each by Other route as needed for Other Use as instructed     acetaminophen 325 MG tablet  Dose: 325 mg  What changed:   how much to take  when to take this  Commonly known as: TYLENOL  Take 1 tablet (325 mg) by mouth every 4 (four) hours  celecoxib 100 MG capsule  Dose: 100 mg  Commonly known as: CeleBREX  Take 1 capsule (100 mg) by mouth 2 (two) times daily     docusate sodium 100 MG capsule  Dose: 100 mg  Commonly known as: COLACE  Take 1 capsule (100 mg) by mouth 3 (three) times daily     fexofenadine 180 MG tablet  Dose: 180 mg  Commonly known as: ALLEGRA  Take 1 tablet (180 mg) by mouth every morning     folic acid 1 MG tablet  What changed:   how much to take  how to take this  when to take this  additional instructions  Commonly known as: FOLVITE  TAKE 1 TABLET BY MOUTH EVERY DAY     gabapentin 300 MG capsule  Dose: 300 mg  Commonly known as: NEURONTIN  Take 1 capsule (300 mg) by  mouth 3 (three) times daily     hydroxychloroquine 200 MG tablet  Dose: 300 mg  Commonly known as: PLAQUENIL  Take 1.5 tablets (300 mg) by mouth every morning TAKES 1 1/2 TABS     levothyroxine 25 MCG tablet  Dose: 25 mcg  Commonly known as: SYNTHROID  Take 1 tablet (25 mcg) by mouth Once a day at 6:00am     losartan-hydrochlorothiazide 100-25 MG per tablet  Dose: 1 tablet  What changed: when to take this  Commonly known as: HYZAAR  Take 1 tablet by mouth daily     metoprolol succinate XL 50 MG 24 hr tablet  Dose: 50 mg  What changed: when to take this  Commonly known as: TOPROL-XL  Take 1 tablet (50 mg) by mouth daily     naloxone 4 MG/0.1ML nasal spray  Commonly known as: NARCAN  For: Opioid Overdose  1 spray intranasally. If pt does not respond or relapses into respiratory depression call 911. Give additional doses every 2-3 min.     Ozempic (1 MG/DOSE) 4 MG/3ML Sopn  Dose: 1 mg  What changed: when to take this  Generic drug: Semaglutide (1 MG/DOSE)  Inject 1 mg into the skin once a week     potassium chloride 10 MEQ CR tablet  What changed: when to take this  Commonly known as: KLOR-CON M10  TAKE 1 TABLET(10 MEQ) BY MOUTH DAILY     REMICADE IV  Infuse into the vein every 28 days LAST DOSE 8/213/23, NEXT DOSE AFTER SURGERY     rosuvastatin 20 MG tablet  Dose: 20 mg  What changed: when to take this  Commonly known as: CRESTOR  Take 1 tablet (20 mg) by mouth daily     traMADol 50 MG tablet  Dose: 50 mg  Commonly known as: ULTRAM  Take 1 tablet (50 mg) by mouth every 6 (six) hours as needed for Pain     traZODone 50 MG tablet  Dose: 50 mg  Commonly known as: DESYREL  Take 1 tablet (50 mg) by mouth nightly     Womens 50+ Multi Vitamin/Min Tabs  Dose: 1 tablet  Take 1 tablet by mouth daily              Pending Labs:     Unresulted Labs       None           Discharge Destination:   Acute Rehab   Condition at Discharge :   Stable  Labs/Images to be followed at your PCP office   CBC/BMP  Follow-up:      Follow-up  Information       Gaetano Hawthorne, DNP FNP. Schedule an appointment as soon as possible for a visit.    Specialty: Family Nurse Practitioner  Contact information:  [address]  [phone]  [place] [address]  [phone]               Leeroy Cha, MD. Schedule an appointment as soon as possible for a visit in 1 week(s).    Specialties: Orthopaedic Sports Medicine, Orthopaedic Surgery  Why: To follow up  Contact information:  [address]  [place] [address]  [phone]                            Recommended Follow up with PCP in one week.  Follow up with Dr. Laural Benes  Time spent for Discharge Care:   66 minutes    Patient care time was spent personally by me on the following activities: Blood draw for specimens, development of treatment plan with patient or surrogate, discussions with consultants, examination of patient, re-evaluation of patient's condition, pulse oximetry, ordering and review of radiographic studies, ordering and review of laboratory studies and ordering and performing treatments and interventions.     Signed by: Otilio Jefferson, FNP

## 2022-07-02 NOTE — PT Eval Note (Addendum)
Physical Therapy Evaluation  Patient: Claudia Adams    [id number]  Discharge Recommendations:   Based on today's session: Acute Rehab     If Acute Rehab is not available, then the patient will need home health services, increase supervision , 24/7 supervision, assistance with mobility, assistance with ADL's, and assistance with IADL's    If pt returns home, DME Recommended for Discharge: Hemi walker;Shower chair;BSC      Assessment:   Claudia Adams is a 76 y.o. female admitted 07/01/2022.  Pt's functional mobility is impacted by:  decreased activity tolerance, decreased balance, gait impairment, decreased safety awareness, pain, ROM deficits, shoulder precautions, and decreased strength.  There are a few comorbidities or other factors that affect plan of care and require modification of task including: assistive device needed for mobility, recent surgery, home alone for a portion of the day, and lives alone.  Standardized tests and exams incorporated into evaluation include AMPAC mobility, balance, and Strength.  Pt demonstrates a stable clinical presentation due to no adverse response to activity.   Pt would continue to benefit from PT to address these deficits and increase functional independence.     Complexity Level Hx and Co  morbidites Examination Clinical Decision Making Clinical Presentation   Low no impact 1-2 elements Limited options Stable   Moderate   1-2 factors 3 or more   Several options Evolving, plan may alter   High 3 or more 4 or more Multiple options Unstable, unpredictable       Impairments: Assessment: Decreased endurance/activity tolerance;Decreased functional mobility;Decreased balance;Gait impairment.     Therapy Diagnosis: decreased functional mobility , increased gait dysfunction, and decreased endurance/ activity engagement due to recent surgery and hospital stay. Without therapy interventions, patient is at risk for falls, dependence on caregivers for mobility, decreased  independence, failure to return to PLOF, and decreased quality of life.    Rehabilitation Potential: Prognosis: Good;With continued PT status post acute discharge      Plan:    Treatment/Interventions: Exercise;Gait training;Neuromuscular re-education;Functional transfer training;LE strengthening/ROM;Endurance training PT Frequency: 3-4x/wk    Risks/Benefits/POC Discussed with Pt/Family: With patient          Goals:   Goals  Goal Formulation: With patient  Time for Goal Acheivement: 5 visits  Goals: Select goal  Pt Will Perform Sit to Stand: independent;to maximize functional mobility and independence;5 visits  Pt Will Transfer Bed/Chair: with hemi walker;modified independent;to maximize functional mobility and independence;5 visits  Pt Will Ambulate: 51-100 feet;with hemi walker;with supervision;to maximize functional mobility and independence;5 visits       [facility] [phone]    PT Received On: 07/02/22  Start Time: 1026  Stop Time: 1047  Time Calculation (min): 21 min    PT Visit Number: 1    Consult received for [name] for PT Evaluation and Treatment.  Patient's medical condition is appropriate for Physical therapy intervention at this time.    Precautions and Contraindications:   Precautions  Weight Bearing Status: RUE non weight bearing  Precaution Instructions Given to Patient: Yes  Fall Risks: Medium;Impaired balance/gait;Impaired mobility  Other Precautions: Sling only removed for ROM exercises and to wash under arm. To be worn with backstrap while sleeping 4-6 weeks.    Week One: Passive and Assisted ROM of right shoulder: Pendulum exercises, Passive or assisted supine forward flexion < 120, Assisted ER <45, Assisted Extension. Patient may extend elbow without moving shoulder. Patient may squeeze ball with right  hand.     Medical Diagnosis: Other specified arthritis, right shoulder [M13.811]  Rheumatoid arthritis [M06.9]    History of Present  Illness: Claudia Adams is a 76 y.o. female admitted on 07/01/2022 "with insulin-dependent diabetes mellitus and rheumatoid arthritis complaining of chronic right shoulder pain for decades in duration. She is status post rotator cuff repair by different surgeon in [place] in the distant past. It has been slowly worsening since 2017 without acute injury. She describes the pain as sharp in quality, intermittent in nature, worse with reaching/overhead activities of daily living and persistent despite NSAIDs, rest, activity modification, physical therapy, and corticosteroid injection as recently as May 2023. The pain is posterolateral and anterior in location and worse with reaching and overhead activities. She has pain at night that interferes with her ability to sleep. She rates the pain as 5/10 on a visual" (Per H&P)    07/01/22: Surgical Procedure:right reverse total shoulder arthroplasty   right shoelder open biceps tenodesis Surgeon: Leeroy Cha      Patient Active Problem List   Diagnosis    Rheumatoid arthritis    Diabetes mellitus    Essential hypertension    Hyperlipidemia    Hypothyroidism    Anxiety and depression    Primary localized osteoarthrosis of ankle and foot    Polyneuropathy due to type 2 diabetes mellitus    Stage 3a chronic kidney disease    Positive QuantiFERON-TB Gold test    Other specified arthritis, right shoulder    Osteoarthritis of right shoulder        Past Medical/Surgical History:  Past Medical History:   Diagnosis Date    Anemia     H/H 11.2 / 33 06/17/22    Anxiety     SITUATIONAL    Diabetes mellitus     Disorder of thyroid     Ear, nose and throat disorder     ALLERGIES    Hyperlipidemia     Hypertension     Hypothyroidism     MANAGED BY ENDOCRINOL- STABLE    Neuropathy     CONTROLLED WITH GABAPENTIN    Other specified arthritis, right shoulder     PREOP DIAGNOSIS    Renal insufficiency     CKD 3 MONITORED BY PCP. CREAT 1.1 06/17/22    Rheumatoid arthritis     DR  Kipp Brood- LAST SEEN FOE REMICADE 04/2022    Seasonal allergic rhinitis     ALLEGRA    Type 2 diabetes mellitus, controlled     MANAGED BY ENDOCRINOL NOTE 02/15/22 IN NOTES.  AM FBS 130-160. HA1C 02/15/22 6.2      Past Surgical History:   Procedure Laterality Date    EYE SURGERY      CATARCTS    FINGER SURGERY Right     INDEX    HYSTERECTOMY      KNEE SURGERY Right 11/30/2019    knee replacement by Dr. Harlene Salts    ROTATOR CUFF REPAIR Left     SHOULDER SURGERY Right 2019    Rotator cuff    SHOULDER SURGERY Right     PUT BACK IN SOCKET         X-Rays/Tests/Labs:  XR Shoulder Right 2+ Views    Result Date: 07/01/2022  Status post reverse total shoulder arthroplasty with expected postoperative changes. Fredrich Birks, MD 07/01/2022 1:58 PM       Social History:  Prior Level of Function  Prior level of function: Independent with ADLs, Ambulates  independently  Baseline Activity Level: Community ambulation  Driving: independent  Cooking: Yes  DME Currently at Home: Gilmer Mor, Single [employer] Living Arrangements  Living Arrangements: Alone  Type of Home: House  Home Layout: One level  Bathroom Shower/Tub: Walk-in shower  DME Currently at Home: Cane, Single Point      Subjective:    Patient is agreeable to participation in the therapy session. Nursing clears patient for therapy.      Pain Assessment  Pain Assessment: Numeric Scale (0-10)  Pain Score: 5-moderate pain  Pain Location: Shoulder  Pain Orientation: Right  Pain Descriptors: Aching  Pain Frequency: Continuous;Increases with movement  Effect of Pain on Daily Activities: moderate  Pain Intervention(s): Medication (See eMAR)    Patient denies light headed, dizzy, or SOB.    Objective:   Observation of Patient/Vital Signs:  Patient is in bed with peripheral IV and shoulder sling and back strap in place.         Cognition/Neuro Status  Arousal/Alertness: Appropriate responses to stimuli  Attention Span: Appears intact  Orientation Level: Oriented X4  Memory: Appears  intact  Following Commands: Follows all commands and directions without difficulty  Safety Awareness: minimal verbal instruction  Insights: Educated in safety awareness  Behavior: calm;cooperative;attentive  Motor Planning: intact  Coordination: intact      Hearing: WNL  Vision: WNL  Sensation: WNL    Gross ROM  Right Lower Extremity ROM: within functional limits  Left Lower Extremity ROM: within functional limits  Gross Strength  Right Lower Extremity Strength: 4+/5  Left Lower Extremity Strength: 4+/5  Tone  Tone: within functional limits    Functional Mobility  Supine to Sit: Stand by Assist  Scooting to EOB: Stand by Assist  Sit to Supine: Stand by Assist  Sit to Stand: Contact Guard Assist  Stand to Sit: Contact Guard Assist     Locomotion  Ambulation: Minimal Assist;with hemi walker  Pattern: decreased step length;decreased cadence  Distance Walked (ft) (Step 6,7): 20 Feet  PMP Activity: Step 6 - Walks in Room     Balance  Balance: needs focused assessment  Sitting - Static: Good  Sitting - Dynamic: Good  Standing - Static: Fair  Standing - Dynamic: Fair    Participation and Endurance  Participation Effort: good  Endurance: Tolerates 10 - 20 min exercise with multiple rests         AM-PACT Inpatient Short Forms  Inpatient AM-PACT Performed? (PT): Basic Mobility Inpatient Short Form  AM-PACT "6 Clicks" Basic Mobility Inpatient Short Form  Turning Over in Bed: None  Sitting Down On/Standing From Armchair: None  Lying on Back to Sitting on Side of Bed: None  Assist Moving to/from Bed to Chair: A little  Assist to Walk in Hospital Room: A little  Assist to Climb 3-5 Steps with Railing: A lot  PT Basic Mobility Raw Score: 20  CMS 0-100% Score: 35.83%    Treatment Activities: Patient participated in PT eval and tx. The patient was educated on the role of physical therapy, plan of care, goals of therapy and safety with mobility. Therapeutic activity was provided with verbal instruction for functional mobility with  emphasis on use of correct body mechanics and upright posture.      Patient educated on precautions: NWB right UE. Movement of the shoulder is passive or assisted per MD orders (Not active). Sling to be worn at all times unless washing or for ROM. Patient verbalized understanding.    When transitioning from  sit to stand from bed or chair, patient was instructed on the importance of pushing up from arm rests/bed when standing and reaching back when sitting down to increase safety and ensure correct position relative to seat.  No pushing with right UE allowed with transfers. Patient was educated on the importance of coming to a complete stand and establishing good upright posture prior to attempting to walk or transfer.    Therapist instructed patient on safe ambulation in room and correct use of HW, including turning and changing directions, negotiating around obstacles in path, and moving safely in a confined space. Patient instructed in safety precautions and energy conservation during ambulation.    Pt. Instructed in static and dynamic standing balance activities to improve balance and ensure stability in upright stance. Skilled verbal instruction and demonstration provided for safety and  correct form.     Pt. Participated in strengthening activities to increase functional task performance and dynamic functional activities to increase strength. Patient given several seated or supine LE exercises to promote and maintain strength, maintain and improve flexibility, and to ensure good circulation. Skilled verbal instruction, tactile cues and demonstration provided to ensure correct form with all exercises. Exercises included: APs, SLR, glute squeezes.    Educated the patient to role of physical therapy, plan of care, goals of therapy and safety with mobility and ADLs, energy conservation techniques, shoulder precautions, weight bearing precautions.    Therapist PPE during session gloves    Thompson Grayer PT,  DPT  Physical Therapist.

## 2022-07-02 NOTE — Progress Notes (Signed)
PROGRESS NOTE        Date Time: 07/02/2022  12:29 PM  Patient Name:Claudia Adams  ZOX:[id number]  PCP: Gaetano Hawthorne, DNP FNP  Attending Physician: Lana Fish, MD / Christel Mormon, MD      Chief Complaint:    Right Shoulder Pain    Subjective:   Sitting at edge of bed, VSS however BP decreased this AM. Patient denies CP/SOB, endorses mild lightheadedness with mobility. BP meds held this AM, encouraged PO hydration and will monitor BP. Pain controlled, denies new neuro deficit/paresthesia. Voiding adequately, denies N&V. Discussed plan for PT/OT today and discharge planning pending recommendations.     Assessment/Plan     Active Diagnosis: Principal Problem:    Other specified arthritis, right shoulder  Active Problems:    Rheumatoid arthritis    Osteoarthritis of right shoulder    Anemia: Hgb noted at 8.6 this AM. No s/s active bleeding noted and VSS. Possibly secondary to ABLA from recent surgery. Will monitor, check AM CBC and trend H&H.     Thrombocytopenia: PLT 109 this AM. Etiology likely reactionary to recent surgery. VSS, no s/s bleeding noted. Will continue VTE ppx with SCDs. Will monitor, check AM CBC.     Type 2 DM: Home Ozempic on hold perioperatively, non formulary in hospital. SSI ordered. Will monitor BG with FS Accu-check ACHS and monitor for signs of hypoglycemia. Hypoglycemia orders in place per protocol. Follows with PCP/Endo as outpatient.     Hypertension: With soft BP noted this AM mildly symptomatic with c/o dizziness upon mobility, home medications held. Encouraged PO hydration. Continue antihypertensives as per home regimen with holding parameters. IV hydralazine ordered as needed. Will monitor vital signs. Consider IV fluid bolus if BP remains low and patient symptomatic.     Hyperlipidemia: Continue statin. Follows with PCP outpatient.      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  Laural Benes. Will use oral pain medications for pain control while monitoring carefully for sedation and respiratory depression. SCDs for deep vein thrombosis prophylaxis. Pain is currently controlled. Side effect of pain medications including constipation, sedation and habituation. Discussed with patient. Stool softeners being given to avoid constipation. Encourage incentive spirometry. Early physical therapy and occupational therapy per orthopedics. Have also discussed benefits and side effects of anticoagulation, including but not limited to gastrointestinal bleed and intracranial bleed. Patient verbalized understanding.     CKD III: Chronic, stable. Baseline creat 1.1 per pre-op labs, creat 1.1 this AM. Encouraged PO hydration. Will monitor, check AM BMP. Will consult nephrology PRN, follows with PCP as outpatient.     Hypothyroidism: Chronic, stable. Follows with PCP/Endo as outpatient. Will continue medication per home regimen.      Neuropathy: Chronic, stable. Follows with PCP as outpatient. Will continue medication per home regimen.      Rheumatoid Arthritis: Chronic, stable. Follows with PCP/Rheum as outpatient. Home medications on hold at this time, can resume at discharge pending Ortho recommendation. Recommend continued outpatient follow up for monitoring/management.      Anxiety: Chronic, stable. Will continue medications per home regimen. Will monitor for needs during hospital stay. Follows with PCP as outpatient.      Seasonal Allergies: Chronic, stable. No evidence of acute exacerbation, lungs CTA on assessment. Will continue medications per home regimen. Follows with PCP as outpatient.       Thank you Dr. Laural Benes for consulting [place] on this very pleasant patient. We will continue to follow with you on this  admission.      DVT prophylaxis: SCDs    Allergies:     Allergies   Allergen Reactions    Penicillin G      Reports allergic as a child         Physical Exam:    height is 1.549 m (5' 0.98") and weight  is 62.6 kg (137 lb 14.4 oz). Her oral temperature is 98.2 F (36.8 C). Her blood pressure is 108/64 and her pulse is 74. Her respiration is 12 and oxygen saturation is 96%.   Body mass index is 26.07 kg/m.  Vitals:    07/02/22 0026 07/02/22 0450 07/02/22 0800 07/02/22 1146   BP: 106/52 97/58 97/43  108/64   Pulse: 83 79 80 74   Resp: 17 16 12 12    Temp: 99 F (37.2 C) 98.8 F (37.1 C) 98.8 F (37.1 C) 98.2 F (36.8 C)   TempSrc: Oral Oral Oral Oral   SpO2: 93% 94% 95% 96%   Weight:       Height:         Intake and Output Summary (Last 24 hours) at Date Time    Intake/Output Summary (Last 24 hours) at 07/02/2022 1229  Last data filed at 07/02/2022 0900  Gross per 24 hour   Intake 870 ml   Output 350 ml   Net 520 ml     Constitutional: Patient is oriented to person, place, and time. Patient appears well-developed and well-nourished.   Head: Normocephalic and atraumatic.  Eyes- pupils equal and reactive, extraocular eye movements intact, sclera anicteric  Ears - external ear canals normal, right ear normal, left ear normal  Nose - normal and patent, no erythema,   Mouth - mucous membranes moist, pharynx normal without lesions  Neck: Normal range of motion. Neck supple. No JVD present.   Cardiovascular: Normal rate, regular rhythm, normal heart sounds and intact distal pulses.  Exam reveals no gallop and no friction rub. No murmur heard.  Pulmonary/Chest: Effort normal and breath sounds normal. No stridor. No respiratory distress. Patient has no wheezes. No rales were present.  Exhibits no tenderness.   Abdominal: Soft. Bowel sounds are normal. Patient exhibits no distension and no mass was palpable. There is no tenderness. There is no rebound and no guarding.   Musculoskeletal: Decreased ROM to RUE consistent with postoperative state, sling in place. Patient exhibits no edema and no tenderness.   Lymphadenopathy:  Patient has no cervical adenopathy.   Neurological: Patient is alert and oriented to person, place,  and time and has normal reflexes. No cranial nerve deficit.  Normal muscle tone. Coordination normal.   Skin: Skin is warm. No rash noted. Patient is not diaphoretic. No erythema. No pallor. + RUE surgical dressing CDI  Psychiatric: Has normal mood and affect. Behavior is normal. Judgment and thought content normal.    Consult Input/Plan     Plan   IP CONSULT TO ANESTHESIOLOGY  IP CONSULT TO HOSPITALIST  IP CONSULT TO SOCIAL WORK    Review of Systems:   A comprehensive review of systems has no changes since H&P was obtained except as mentioned in the subjective section.    Vitals 24 hrs:   Vitals:    07/02/22 0026 07/02/22 0450 07/02/22 0800 07/02/22 1146   BP: 106/52 97/58 97/43  108/64   Pulse: 83 79 80 74   Resp: 17 16 12 12    Temp: 99 F (37.2 C) 98.8 F (37.1 C) 98.8 F (37.1 C) 98.2 F (36.8  C)   TempSrc: Oral Oral Oral Oral   SpO2: 93% 94% 95% 96%   Weight:       Height:            Readmission:   Office Visit on 06/17/2022   Component Date Value Ref Range Status    WBC 06/17/2022 5.44  3.10 - 9.50 x10 3/uL Final    Hgb 06/17/2022 11.2 (L)  11.4 - 14.8 g/dL Final    Hematocrit 16/06/9603 33.0 (L)  34.7 - 43.7 % Final    Platelets 06/17/2022 166  142 - 346 x10 3/uL Final    RBC 06/17/2022 3.58 (L)  3.90 - 5.10 x10 6/uL Final    MCV 06/17/2022 92.2  78.0 - 96.0 fL Final    MCH 06/17/2022 31.3  25.1 - 33.5 pg Final    MCHC 06/17/2022 33.9  31.5 - 35.8 g/dL Final    RDW 54/05/8118 13  11 - 15 % Final    MPV 06/17/2022 11.2  8.9 - 12.5 fL Final    Instrument Absolute Neutrophil Cou* 06/17/2022 3.08  1.10 - 6.33 x10 3/uL Final    Comment: The IANC is a preliminary result.  Final absolute neutrophil count may differ.      Neutrophils 06/17/2022 56.5  None % Final    Lymphocytes Automated 06/17/2022 33.6  None % Final    Monocytes 06/17/2022 6.3  None % Final    Eosinophils Automated 06/17/2022 2.8  None % Final    Basophils Automated 06/17/2022 0.6  None % Final    Immature Granulocytes 06/17/2022 0.2  None %  Final    Nucleated RBC 06/17/2022 0.0  0.0 - 0.0 /100 WBC Final    Neutrophils Absolute 06/17/2022 3.08  1.10 - 6.33 x10 3/uL Final    Lymphocytes Absolute Automated 06/17/2022 1.83  0.42 - 3.22 x10 3/uL Final    Monocytes Absolute Automated 06/17/2022 0.34  0.21 - 0.85 x10 3/uL Final    Eosinophils Absolute Automated 06/17/2022 0.15  0.00 - 0.44 x10 3/uL Final    Basophils Absolute Automated 06/17/2022 0.03  0.00 - 0.08 x10 3/uL Final    Immature Granulocytes Absolute 06/17/2022 0.01  0.00 - 0.07 x10 3/uL Final    Absolute NRBC 06/17/2022 0.00  0.00 - 0.00 x10 3/uL Final    Glucose 06/17/2022 203 (H)  70 - 100 mg/dL Final    Comment: ADA guidelines for diabetes mellitus:  Fasting:  Equal to or greater than 126 mg/dL  Random:   Equal to or greater than 200 mg/dL      BUN 14/78/2956 21.3  7.0 - 21.0 mg/dL Final    Creatinine 08/65/7846 1.1 (H)  0.4 - 1.0 mg/dL Final    Sodium 96/29/5284 140  135 - 145 mEq/L Final    Potassium 06/17/2022 4.4  3.5 - 5.3 mEq/L Final    Chloride 06/17/2022 105  99 - 111 mEq/L Final    CO2 06/17/2022 32 (H)  17 - 29 mEq/L Final    Calcium 06/17/2022 9.2  7.9 - 10.2 mg/dL Final    Protein, Total 06/17/2022 6.5  6.0 - 8.3 g/dL Final    Albumin 13/24/4010 4.0  3.5 - 5.0 g/dL Final    AST (SGOT) 27/25/3664 23  5 - 41 U/L Final    ALT 06/17/2022 12  0 - 55 U/L Final    Alkaline Phosphatase 06/17/2022 56  37 - 117 U/L Final    Bilirubin, Total 06/17/2022 0.8  0.2 - 1.2 mg/dL  Final    Globulin 06/17/2022 2.5  2.0 - 3.6 g/dL Final    Albumin/Globulin Ratio 06/17/2022 1.6  0.9 - 2.2 Final    Anion Gap 06/17/2022 3.0 (L)  5.0 - 15.0 Final    Comment: Calculated AGAP = Na - (CL + CO2)  Interpret with caution; calculated AGAP may not  reflect patient's true clinical status.  This is a calculated value and platform-dependent.  A value >12.0 has been recommended for the management of  Hyperglycemic Crises: Diabetic Ketoacidosis and Hyperglycemic  Hyperosmolar State. Med Clin [place].  2017;101(3):587-606.  doi:10.1016/j.mcna.2016.12.011      eGFR 06/17/2022 52.0 (A)  >=60 mL/min/1.73 m2 Final    Comment: Reported eGFR is based on the CKD-EPI 2021 equation that  does not use a race coefficient. This equation is used for  all patients (both Black and non-Black), and old and new GFR  estimates may differ by more than 10%, particularly at higher  eGFRcr values and at younger ages. For eGFR of 45-59 mL/min/1.73 m2  with uACR <30 mg/g, please check NKF KDOQI and KDIGO guidelines:  https://www.kidney.org/professionals/kdoqi    GFR estimates are unreliable in patients with:  Rapidly changing kidney function or recent dialysis,  extreme age, body size or body composition(obesity,  severe malnutrition). Abnormal muscle mass (limb  amputation, muscle wasting). In these patients,  alternative determinations of GFR should be obtained.      PT 06/17/2022 11.1  10.1 - 12.9 sec Final    PT INR 06/17/2022 1.0  0.9 - 1.1 Final    Comment: Recommended Ranges for Protime INR:  2.0-3.0 for most medical and surgical thromboembolic states  4.0-1.0 for artificial heart valves INR result may not represent  exact Warfarin dosing level during the transition period from  Heparin to Warfarin therapy. Results should be interpreted based  on current anticoagulant therapy and patient's clinical presentation.      PTT 06/17/2022 26 (L)  27 - 39 sec Final    Comment: Anti-Xa is the primary test for monitoring heparin therapy. APTT  is the screening test for bleeding disorders/coagulopathies.  APTT may be used as a secondary monitoring of heparin therapy  if Anti-Xa cannot be measured.      Hemolysis Index 06/17/2022 5  0 - 24 Index Final    Comment: Hemolysis Index is an internal quality control value and not a diagnostic  indicator.          Coagulation Profile:          Medications:   Current Facility-Administered Medications   Medication Dose Route Frequency Last Rate Last Admin    acetaminophen (TYLENOL) tablet 325 mg  325 mg  Oral Q4H   325 mg at 07/02/22 0508    alum & mag hydroxide-simethicone (MAALOX PLUS) 200-200-20 mg/5 mL suspension 15 mL  15 mL Oral Q4H PRN        celecoxib (CeleBREX) capsule 100 mg  100 mg Oral BID   100 mg at 07/02/22 0919    cetirizine (ZyrTEC) tablet 10 mg  10 mg Oral Daily   10 mg at 07/02/22 0919    dextrose (GLUCOSE) 40 % oral gel 15 g of glucose  15 g of glucose Oral PRN        Or    dextrose (D10W) 10% bolus 125 mL  12.5 g Intravenous PRN        Or    dextrose 50 % bolus 12.5 g  12.5 g Intravenous PRN  Or    glucagon (rDNA) (GLUCAGEN) injection 1 mg  1 mg Intramuscular PRN        diphenhydrAMINE (BENADRYL) capsule 50 mg  50 mg Oral Q4H PRN        docusate sodium (COLACE) capsule 100 mg  100 mg Oral TID   100 mg at 07/02/22 0508    gabapentin (NEURONTIN) capsule 300 mg  300 mg Oral TID   300 mg at 07/02/22 1610    hydrALAZINE (APRESOLINE) injection 10 mg  10 mg Intravenous Q6H PRN        insulin lispro injection 1-3 Units  1-3 Units Subcutaneous QHS   2 Units at 07/01/22 2207    insulin lispro injection 1-5 Units  1-5 Units Subcutaneous TID AC   2 Units at 07/01/22 1800    levothyroxine (SYNTHROID) tablet 25 mcg  25 mcg Oral Daily at 0600   25 mcg at 07/02/22 9604    losartan (COZAAR), hydroCHLOROthiazide (HYDRODIURIL) for HYZAAR   Oral Daily        magnesium sulfate 1g in dextrose 5% IVPB (premix)  1 g Intravenous PRN        melatonin tablet 3 mg  3 mg Oral QHS PRN        metoprolol succinate XL (TOPROL-XL) 24 hr tablet 50 mg  50 mg Oral QAM        naloxone (NARCAN) injection 0.2 mg  0.2 mg Intravenous PRN        oxyCODONE (ROXICODONE) immediate release tablet 10 mg  10 mg Oral Q3H PRN        oxyCODONE (ROXICODONE) immediate release tablet 15 mg  15 mg Oral Q3H PRN        oxyCODONE (ROXICODONE) immediate release tablet 5 mg  5 mg Oral Q3H PRN        potassium & sodium phosphates (PHOS-NAK) 280-160-250 MG packet 2 packet  2 packet Oral PRN        potassium chloride (KLOR-CON M10) CR tablet 10  mEq  10 mEq Oral QAM   10 mEq at 07/02/22 0929    potassium chloride (KLOR-CON M20) CR tablet 0-40 mEq  0-40 mEq Oral PRN        And    potassium chloride 10 mEq in 100 mL IVPB (premix)  10 mEq Intravenous PRN        rosuvastatin (CRESTOR) tablet 20 mg  20 mg Oral QAM   20 mg at 07/02/22 0919    traZODone (DESYREL) tablet 50 mg  50 mg Oral QHS   50 mg at 07/01/22 2207        CBC review:   Recent Labs   Lab 07/02/22  0557   WBC 7.86   Hgb 8.6*   Hematocrit 24.6*   Platelets 109*   MCV 90.4   RDW 13        Chem Review:  Recent Labs   Lab 07/02/22  0557   Sodium 139   Potassium 3.7   Chloride 106   CO2 27   BUN 17.0   Creatinine 1.1*   Glucose 158*   Calcium 8.2        Labs:     Results       Procedure Component Value Units Date/Time    Glucose Whole Blood - POCT [[id number]]  (Abnormal) Collected: 07/02/22 1145     Updated: 07/02/22 1213     Whole Blood Glucose POCT 224 mg/dL     Glucose Whole Blood - POCT [  [id number]]  (Abnormal) Collected: 07/02/22 0758     Updated: 07/02/22 0805     Whole Blood Glucose POCT 154 mg/dL     Basic Metabolic Panel [[id number]]  (Abnormal) Collected: 07/02/22 0557    Specimen: Blood Updated: 07/02/22 0658     Glucose 158 mg/dL      BUN 78.2 mg/dL      Creatinine 1.1 mg/dL      Calcium 8.2 mg/dL      Sodium 956 mEq/L      Potassium 3.7 mEq/L      Chloride 106 mEq/L      CO2 27 mEq/L      Anion Gap 6.0     eGFR 52.0 mL/min/1.73 m2     CBC without differential [[id number]]  (Abnormal) Collected: 07/02/22 0557    Specimen: Blood Updated: 07/02/22 0635     WBC 7.86 x10 3/uL      Hgb 8.6 g/dL      Hematocrit 46.9 %      Platelets 109 x10 3/uL      RBC 2.72 x10 6/uL      MCV 90.4 fL      MCH 31.6 pg      MCHC 35.0 g/dL      RDW 13 %      MPV 10.7 fL      Nucleated RBC 0.0 /100 WBC      Absolute NRBC 0.00 x10 3/uL     Glucose Whole Blood - POCT [[id number]]  (Abnormal) Collected: 07/01/22 2056     Updated: 07/01/22 2101     Whole Blood Glucose POCT 277 mg/dL     Glucose Whole Blood - POCT [[id number]]   (Abnormal) Collected: 07/01/22 1658     Updated: 07/01/22 1704     Whole Blood Glucose POCT 210 mg/dL           Rads:   Radiological Procedure reviewed.  Radiology Results (24 Hour)       Procedure Component Value Units Date/Time    XR Shoulder Right 2+ Views [[id number]] Collected: 07/01/22 1356    Order Status: Completed Updated: 07/01/22 1400    Narrative:      HISTORY: Right reverse total shoulder arthroplasty for right shoulder pain    COMPARISON: None.    FINDINGS:  There are postsurgical changes of reverse total shoulder arthroplasty. The  hardware is in expected position. There is no periprosthetic fracture.  There are cerclage wires at the proximal humeral metaphysis. There is  overlying soft tissue swelling soft tissue gas. There are overlying skin  staples.      Impression:        Status post reverse total shoulder arthroplasty with expected postoperative  changes.    Fredrich Birks, MD  07/01/2022 1:58 PM            Time spent for evaluation, management and coordination of care:   :56 minutes    Patient care time was spent personally by me on the following activities: Blood draw for specimens, development of treatment plan with patient or surrogate, discussions with consultants, examination of patient, re-evaluation of patient's condition, pulse oximetry, ordering and review of radiographic studies, ordering and review of laboratory studies and ordering and performing treatments and interventions.       Signed by: Otilio Jefferson, FNP  07/02/2022 12:29 PM

## 2022-07-02 NOTE — Progress Notes (Signed)
Initial Case Management Assessment and Discharge Planning  [facility]   Patient Name: Claudia Adams, Claudia Adams   Date of Birth [birth year]   Attending Physician: Leeroy Cha, MD   Primary Care Physician: Gaetano Hawthorne, DNP FNP   Length of Stay 0   Reason for Consult / Chief Complaint 76 yo F admitted s/p R reverse shoulder arthroplasty.         Situation   Admission DX:   1. Rheumatoid arthritis        A/O Status: X 3    Patient admitted from: direct  Admission Status: ambulatory    Health Care Agent: Self  Name / phone number:      Background     Advanced directive:   Not Received    Code Status:   Full Code     Residence: One story home/apartment    PCP: Gaetano Hawthorne, DNP FNP  Patient Contact:   [phone] (home)     [phone] (mobile)   Emergency contact:   Extended Emergency Contact Information  Primary Emergency Contact: Gauntt,Stacey  Mobile Phone: [phone]  Relation: Daughter   ADL/IADL's: Independent  Previous Level of function: 7 Independent     DME: None    Pharmacy:     [facility] [phone] [place] - [phone] AT [facility] OF [facility] & [address]  [address]  [place] [address]  Phone: [phone] Fax: [phone]      Prescription Coverage: Yes    Home Health: The patient is not currently receiving home health services.    Previous SNF/AR: none    COVID Vaccine Status: x4    Transport for discharge? Mode of transportation: Sales executive - Family/Friend to drive patient   Assessment   Spoke with pt via telephone for CM routine screening, assessment and care coordination for discharge planning, demographics & pharmacy verified. Pt lives alone in a ground level apartment. PTA pt was independent with all ADLs and IADLs. Pts PCP is NP  Gaetano Hawthorne, she was seen by that office within the last 2 weeks for pre-op clearance. Pt states she would like to go to [facility] for rehab if recommended by PT OT, she had toured the facility last week at the  recommendation of surgery. All questions answered prior to ending call w/ pt. The above information has been passed onto pts on-site CM.   BARRIERS TO DISCHARGE: none at this time   Recommendation   [place] to [facility] if PT recommends it. CM will continue to follow.      Steele Berg, BSN RN  Case Management  [facility]  [address], [address]  [place], [place] [address]  T [phone]  F [phone]

## 2022-07-02 NOTE — UM Notes (Addendum)
Order:   Place in Outpatient/Ambulatory Status (Order [id number]) on 07/01/22       Patient Name: Claudia Adams   Patient DOB: 07/17/1946  [facility] [address] [facility]  [facility]    Payor Berkley Harveyauth is for:  Medicare primary- NPR OP    Review for DOS: 07/01/22     Patient presents as direct admit for planned procedure and was transferred to surgical unit.    Operative Note:    Patient Name: Claudia Adams,Claudia Adams (MRN: [id number])  Attending Physician: Leeroy ChaJohnson, Timothy S, MD        Operative Report      Patient Name:  Claudia Adams      Date of Birth:  10/11/1945       [facility]       MR# at Location:  [id number]       Date of Surgery:  07/01/2022       Dx Codes:  M13.811 Other specified arthritis, right shoulder, M75.121 Complete rotator cuff tear or rupture of    right shoulder, not specified as traumatic, M75.21 Bicipital tendinitis, right shoulder, S46.191A    Other injury of muscle, fascia and tendon of long head of biceps, right arm, initial encounter, E11.9    Type 2 diabetes mellitus without complications, I10 Essential (primary) hypertension, M06.4    Inflammatory polyarthropathy       Procedure Codes:  23430 59 RT TENODESIS LONG TENDON BICEPS, 23430 59 AS RT TENODESIS LONG TENDON    BICEPS, 23472 RT ARTHROPLASTY GLENOHUMERAL JOINT TOTAL SHOULDER, [id number] AS RT    ARTHROPLASTY GLENOHUMERAL JOINT TOTAL SHOULDER       Surgical Procedure:right reverse total shoulder arthroplasty   right shoelder open biceps tenodesis Surgeon: Leeroy Chaimothy S. Johnson, MD Assistant: Gilmer MorElizabeth Zwicker OTC, ATC., Nadara MustardAmar Al Sahrawi, MD: A certified Orthopaedic Technologist as a      Surgical First Assistant was required for positioning, sterile draping, tourniquet application, tool preparation, stabilization and manipulation of the limb while accessing and operating. This was necessary due to the complexity of the procedure. An alternative qualified first assistant was unavailable to assist with  this case.      Anesthesia: General/regional w/ Exparel GHW:[id number] by Cloyde Reamsindy Portner, DO EBL: 100cc Implants: manufacturer: LIMA   stem 14mm press Fit, polyethlene-standard, glenoid Baseplate-TT MB 7degree with medium peg and 4 screws glenosphere 36mm eccentric, Synthes cable with clip      Specimens: None Tourniquet Time: 0 Minutes 250 mmHG Complications: calcar fracture that was reduced and stabilized with cable fixation Condition: The patient was transported to the recovery room in stable condition.      Indications:  Claudia Adams presented to the office complaining of shoulder pain that was persisted despite the use of multiple conservative/nonsurgical measures.           Scheduled Meds:  Current Facility-Administered Medications   Medication Dose Route Frequency    acetaminophen  325 mg Oral Q4H    celecoxib  100 mg Oral BID    cetirizine  10 mg Oral Daily    docusate sodium  100 mg Oral TID    gabapentin  300 mg Oral TID    insulin lispro  1-3 Units Subcutaneous QHS    insulin lispro  1-5 Units Subcutaneous TID AC    levothyroxine  25 mcg Oral Daily at 0600    losartan (COZAAR), hydroCHLOROthiazide (HYDRODIURIL) for HYZAAR   Oral Daily    metoprolol succinate XL  50 mg Oral QAM    potassium chloride  10 mEq Oral QAM    rosuvastatin  20 mg Oral QAM    traZODone  50 mg Oral QHS         This clinical review is based on/compiled from documentation provided by the treatment team within the patient's medical record.    Payor: MEDICARE / Plan: MEDICARE PART A AND B / Product Type: Medicare /     Cleopha Indelicato, RN, BSN  Utilization Review   Phone: [phone] (voicemail only)  Fax: [phone]  [email or web address]

## 2022-07-02 NOTE — OT Eval Note (Signed)
Occupational Therapy Evaluation  Patient: Claudia Adams    [id number]  Discharge Recommendations:   Based on today's session: Acute Rehab     If Acute Rehab is not available, then the patient will need home health services, increase supervision , assistance with mobility, assistance with ADL's, and assistance with IADL's  If pt returns home, DME Recommended for Discharge: Reacher;Long-handled sponge;Long-handled shoehorn;Grab bars;Shower chair          Unit: [facility]  Bed: Q4696/E9528.A      Assessment:   Claudia Adams is a 76 y.o. female admitted 07/01/2022.   Brief chart review completed including review of labs, review of imaging, review of vitals, and review of H&P and physician progress notes.  Pt's ability to complete ADLs and functional transfers is impaired due to the following deficits:  decreased activity tolerance, decreased balance, edema, pain, ROM deficits, shoulder precautions, decreased strength, transfers , and weight bearing restrictions.  Pt demonstrates performance deficits with grooming, bathing, dressing, toileting, and functional mobility. There are a few comorbidities or other factors that affect plan of care and require modification of task including: anxiety, recent surgery, lives alone, and past medical history consistent of anxiety, T2DM, hypothyroidism, hypertension, hyperlipidemia, neuropathy, rheumatoid arthritis, CKD III, seasonal allergies, and osteoarthritis .  Pt would continue to benefit from OT to address these deficits and increase functional independence.    Assessment: decreased ROM;decreased strength;balance deficits;decreased independence with ADLs;decreased independence with IADLs;decreased endurance/activity tolerance     Complexity Chart Review Performance Deficits Clinical Decision Making Hx/Comorbidities Assistance needed   Low Brief 1-3 Limited options None None (or at baseline)   Moderate Expanded 3-5 Several  Options 1-2 Min/Mod assist (not at baseline)   High Extensive 5 or more Multiple options 3 or more Max/dependent (not at baseline     Therapy Diagnosis: decreased independence with ADL's due to right reverse total shoulder arthroplasty . Without therapy interventions, patient is at risk for failure to return to PLOF.    Rehabilitation Potential: Prognosis: Good;With continued OT s/p acute discharge      Plan:   OT Frequency Recommended: 3-4x/wk   Treatment Interventions: ADL retraining;Functional transfer training;UE strengthening/ROM;Endurance training;Patient/Family training;Equipment eval/education;Compensatory technique education     Patient Goal  Patient Goal: To go to rehab    Risks/Benefits/POC Discussed with Pt/Family: With patient/family    Goals:   Goal Formulation: Patient;Family  Time For Goal Achievement: by time of discharge  ADL Goals  Patient will groom self: Supervision;at sinkside;3 visits  Patient will dress upper body: Moderate Assist;with hemi tech;5 visits  Patient will dress lower body: Minimal Assist;5 visits  Patient will toilet: Supervision;5 visits  Mobility and Transfer Goals  Pt will transfer bed to toilet: Modified Independent;5 visits     Musculoskeletal Goals  Pt will perform Home Exercise Program: stand by assist;to increase engagement in ADLs;5 visits                       Claudia Adams    OT Received On: 07/02/22  Start Time: 0920  Stop Time: 0947  Time Calculation (min): 27 min  OT Visit Number: 1    Consult received for Claudia Adams for OT Evaluation and Treatment.  Patient's medical condition is appropriate for Occupational therapy intervention at this time.    Precautions and Contraindications:   Precautions  Weight Bearing Status: RUE non weight bearing  Precaution Instructions Given to Patient: Yes  Fall Risks: Medium;Impaired  balance/gait;Impaired mobility  Other Precautions: Sling only removed for ROM exercises and to wash under arm. To be worn with backstrap while  sleeping 4-6 weeks.      Medical Diagnosis: Other specified arthritis, right shoulder [M13.811]  Rheumatoid arthritis [M06.9]    History of Present Illness: Claudia Adams is a 76 y.o. female admitted on 07/01/2022 with right reverse total shoulder arthroplasty due to Complete rotator cuff tear or rupture of    right shoulder, not specified as traumatic, M75.21 Bicipital tendinitis, right shoulder, S46.191A    Other injury of muscle, fascia and tendon of long head of biceps, right arm, initial encounter-per op note    Patient Active Problem List   Diagnosis    Rheumatoid arthritis    Diabetes mellitus    Essential hypertension    Hyperlipidemia    Hypothyroidism    Anxiety and depression    Primary localized osteoarthrosis of ankle and foot    Polyneuropathy due to type 2 diabetes mellitus    Stage 3a chronic kidney disease    Positive QuantiFERON-TB Gold test    Other specified arthritis, right shoulder    Osteoarthritis of right shoulder        Past Medical/Surgical History:  Past Medical History:   Diagnosis Date    Anemia     H/H 11.2 / 33 06/17/22    Anxiety     SITUATIONAL    Diabetes mellitus     Disorder of thyroid     Ear, nose and throat disorder     ALLERGIES    Hyperlipidemia     Hypertension     Hypothyroidism     MANAGED BY ENDOCRINOL- STABLE    Neuropathy     CONTROLLED WITH GABAPENTIN    Other specified arthritis, right shoulder     PREOP DIAGNOSIS    Renal insufficiency     CKD 3 MONITORED BY PCP. CREAT 1.1 06/17/22    Rheumatoid arthritis     DR Kipp Brood- LAST SEEN FOE REMICADE 04/2022    Seasonal allergic rhinitis     ALLEGRA    Type 2 diabetes mellitus, controlled     MANAGED BY ENDOCRINOL NOTE 02/15/22 IN NOTES.  AM FBS 130-160. HA1C 02/15/22 6.2      Past Surgical History:   Procedure Laterality Date    EYE SURGERY      CATARCTS    FINGER SURGERY Right     INDEX    HYSTERECTOMY      KNEE SURGERY Right 11/30/2019    knee replacement by Dr. Harlene Salts    ROTATOR CUFF REPAIR Left     SHOULDER  SURGERY Right 2019    Rotator cuff    SHOULDER SURGERY Right     PUT BACK IN SOCKET         X-Rays/Tests/Labs:  XR Shoulder Right 2+ Views    Result Date: 07/01/2022  Status post reverse total shoulder arthroplasty with expected postoperative changes. Fredrich Birks, MD 07/01/2022 1:58 PM  -per radiology reports.     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 Nursing clears patient for therapy.  Subjective: Patient reports c/o pain 2/10 R shoulder.  Pain Assessment  Pain Assessment: Wong-Baker FACES  Wong-Baker FACES Pain Rating: Hurts little bit  POSS Score: Awake and Alert  Pain Location: Shoulder  Pain Orientation: Right  Pain Intervention(s): Cold applied;Repositioned;Ambulation/increased activity;Medication (See eMAR) (Nursing reports medicating patient for pain prior to session.).        Objective:   Observation of Patient/Vital Signs:  Patient is out of bed, ambulating with dressings and shoulder immobilizer R UE in place.         Cognition/Neuro Status  Arousal/Alertness: Appropriate responses to stimuli  Attention Span: Appears intact  Orientation Level: Oriented X4  Memory: Appears intact  Following Commands: independent  Safety Awareness: minimal verbal instruction  Insights: Educated in safety awareness  Problem Solving: Assistance required to identify errors made  Behavior: attentive;calm;cooperative  Motor Planning: intact  Coordination:  GMC impaired (R UE)    Gross ROM  Right Upper Extremity ROM: needs focused assessment  RUE ROM - Shoulder: unable to assess  RUE ROM - Elbow: reduced by 25%  RUE ROM - Hand: within functional limits  Left Upper Extremity ROM: within functional limits  Gross Strength  Right Upper Extremity Strength: unable to assess (NWB R UE)  Left Upper Extremity Strength: within functional limits     Tone: within functional limits    Sensory  Auditory: intact  Tactile - Light Touch: impaired right (pt reports c/o numbness R hand)  Visual Acuity: wears glasses (reading glasses)       Self-care and Home Management  Eating: Independent  Grooming: Supervision  UB Dressing: Maximal Assist (don/doff shoulder immobilizer)  Toileting: Minimal Assist  Functional Transfers: Stand by Assist;increased time to complete;supervision/safety    Mobility and Transfers  Rolling:  (pt up to bathroom with nursing assist)  Sit to Stand: Stand by Assist; verbal instructions to maintain NWB R UE.  Bed to Chair: Stand by Assist  Functional Mobility/Ambulation: Stand by Assist; ambulated within the room without AD, slow pace, no lob.     Balance  Static Sitting Balance: good  Dyanamic Sitting Balance: good  Static Standing Balance: fair  Dynamic Standing Balance: fair with AD    Participation and Endurance  Participation Effort: good  Endurance: Tolerates < 10 min exercise, no significant change in vital signs    AM-PACT "6 Clicks" Daily Activity Inpatient Short Form  Inpatient AM-PACT Performed?: yes  Put On/Take Off Lower Body Clothing: A little  Assist with Bathing: A little  Assist with Toileting: A little  Put On/Take Off Upper Body Clothing: A lot  Assist with Grooming: A little  Assist with Eating: None  OT Daily Activity Raw Score: 18  CMS 0-100% Score: 46.65%    PMP - Progressive Mobility Protocol   PMP Activity: Step 6 - Walks in Room  Distance Walked (ft) (Step 6,7): 20 Feet    Treatment Activities:     Educated the patient to role of  occupational therapy, plan of care, goals of therapy and HEP, safety with mobility and ADLs, energy conservation techniques, pursed lip breathing, shoulder precautions, weight bearing precautions, home safety.          Therapist PPE during session gloves     Pt instructed in and able complete don/doff shoulder immobilizer w/ max assist.  Pt instructed in proper techniques for don/doff UB clothing using hemi techniques.  Pt indep w/ LB dressing and stressed importance of being seated to start clothes over LE to decreased fall risk. Educated pt in gentle AROM exercises for R hand and wrist, grasp and release w/ all digits, opposition of thumb to all fingers, flexn/extn wrist x 3-5 reps.  Educated in pendulum exercise R UE x5 reps x1set.  Educated pt in icing R shoulder and keeping R hand elevated to decrease edema.  Pt instructed in proper UE placement during functional transfers and importance of maintaining NWB R UE.  Educated pt in proper breathing during functional activities.   Discussed w/ pt regarding obtaining shower chair, hand held hose and non-skid mat to inc safety w/ bathing upon d/c home and once cleared by md to shower.  Provided with handouts on precautions, AE and HEP for R hand/wrist. Pt left w/ ice pack R shoulder, call button and phone w/in reach and instructed in calling for nsg w/ any needs. Daughter in room towards end of session. Nursing notified of session outcomes.      Tamera StandsSeena Pura Picinich OTR/L, MBA  [facility]  Physical Medicine and Rehabilitation Dept  Pager #: [phone]

## 2022-07-02 NOTE — Progress Notes (Signed)
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  She has no acute complaints.  Pain is well controlled.     Objective:  5/5 EPL, first interosseous, FDP #2 consistent with interscalene nerve block  Normal light touch sensation C5-T1 consistent with resolution of interscalene nerve block  Dressing clean/dry/intact  Shoulder immobilizer clean/dry/intact     07/01/2022: Postoperative Right shoulder radiographs:  3 views including AP x2 and scapular Y view demonstrate anatomically aligned reverse total shoulder arthroplasty with anatomically reduced proximal humerus calcar fracture and stablecable fixation     Impression: Status post right reverse total shoulder arthroplasty on 07/01/2022     Plan:   Ortho: stable for discharge from [facility] from an orthopedic perspective on by mouth oxycodone prn , Celebrex100 mg daily, Colace 3 times a day.  I recommend reevaluation as an outpatient in the office on 07/15/2022 at 2 PM.  Internal medicine: Appreciate the support of Dr Chanda/[place] with managing comorbidities including but not limited to diabetes mellitus, renal insufficiency  PT/OT: nonweightbearing right upper extremity.  Physical therapy orders  are provided in paper form in the paper chart for discharge  Nursing: Discharge instructions including wound care instructions were provided in paper form for discharge  Disposition: Consider discharge to [facility] in [place], [place] when cleared by internal medicine.

## 2022-07-02 NOTE — Progress Notes (Signed)
[facility] Note:  No, unable to accept patient. Liaison discussed case with rehab admissions team. The patient does not meet criteria for acute rehab. Patient does not meet medical intensity of service. Goals of therapy can be met in a lower level of care.    Cathlin Buchan, BA, BSN, RN   [facility] Ad[facility] Liaison   [phone]

## 2022-07-02 NOTE — Progress Notes (Signed)
[facility] to [facility] in [facility] stable condition, dtr transported pt , [place] packet sent with pt, report given to the accepting nurse, assisted to car via w/c.

## 2022-07-06 ENCOUNTER — Other Ambulatory Visit (INDEPENDENT_AMBULATORY_CARE_PROVIDER_SITE_OTHER): Payer: Self-pay

## 2022-07-06 DIAGNOSIS — E1142 Type 2 diabetes mellitus with diabetic polyneuropathy: Secondary | ICD-10-CM

## 2022-07-06 MED ORDER — ACCU-CHEK GUIDE VI STRP
ORAL_STRIP | 1 refills | Status: DC
Start: 2022-07-06 — End: 2023-05-05

## 2022-07-06 NOTE — Telephone Encounter (Signed)
Rx request received :    [facility] : [facility]   Medication: Accu-chek guide test strips   Last visit: 02/15/22  Upcoming visit : n/a    Medication clarification :       Please call patient to schedule a follow up appt.

## 2022-07-07 NOTE — Telephone Encounter (Signed)
Left patient a voicemail to schedule

## 2022-07-08 ENCOUNTER — Encounter (INDEPENDENT_AMBULATORY_CARE_PROVIDER_SITE_OTHER): Payer: Self-pay | Admitting: Family

## 2022-07-12 ENCOUNTER — Encounter (INDEPENDENT_AMBULATORY_CARE_PROVIDER_SITE_OTHER): Payer: Self-pay | Admitting: "Endocrinology

## 2022-07-12 ENCOUNTER — Telehealth (INDEPENDENT_AMBULATORY_CARE_PROVIDER_SITE_OTHER): Payer: Medicare Other | Admitting: "Endocrinology

## 2022-07-12 VITALS — BP 120/64 | Wt 134.0 lb

## 2022-07-12 DIAGNOSIS — I10 Essential (primary) hypertension: Secondary | ICD-10-CM

## 2022-07-12 DIAGNOSIS — E782 Mixed hyperlipidemia: Secondary | ICD-10-CM

## 2022-07-12 DIAGNOSIS — E1142 Type 2 diabetes mellitus with diabetic polyneuropathy: Secondary | ICD-10-CM

## 2022-07-12 DIAGNOSIS — E039 Hypothyroidism, unspecified: Secondary | ICD-10-CM

## 2022-07-12 DIAGNOSIS — Z794 Long term (current) use of insulin: Secondary | ICD-10-CM

## 2022-07-12 NOTE — Progress Notes (Signed)
Subjective:      Date: 07/12/2022 8:13 AM   Patient ID: Claudia Adams is a 76 y.o. female.    Verbal consent has been obtained from the patient to conduct a video and telephone: yes     Chief Complaint:  Chief Complaint   Patient presents with    Type 2 diabetes mellitus with diabetic polyneuropathy, with       HPI  Visit type:  follow up   Type: Type II and non-insulin requiring  Evaluation:  diagnosed 20 years ago   Last follow-up:  02/15/22    Diabetes Medication Regimen: Ozempic 1 mg weekly   Medication effectiveness/adherence: working well and general adherence with medications  Medication side effects: none  Presenting symptoms at time of diagnosis: abnormal screening glucose     Prior medications: Metformin - d/c'd due to previously low kidney function     Interval events:   S/p right should total reversible shoulder replacement on 07/01/22   Started in home PT and OT   Is recovering from the surgery well.     Current symptoms: tingling/numbness in extremities which has been stable on Gabapentin 300 mg TID; recent hair loss   Patient denies: no other symptoms including chest pain, shortness of breath, hypoglycemic episodes, polydipsia, polyuria, headaches, dizziness, or abdominal pain  Exercise: walking 34x daily, short walks, none recently due to surgery, doing PT and OT   Diet: moderate compliance with recommended diet;   Complications: diabetic peripheral neuropathy  Pertinent medical history: hyperlipidemia, hypertension, hypothyroidism   Home glucose readings:  none  :    A1c result: 6.2 (02/15/22)  A1c trend is at goal  Microalbumin trend: No results found for: "MICROALBUMIN"  Microalbumin trend is negative  LDL trend:   Lab Results   Component Value Date    LDL 47 02/15/2022    LDL 90 11/09/2021    LDL 92 07/27/2021     LDL trend is at goal   Additional concerns: Pt has stable essential HTN with no evidence of CHF or proteinuria.  The patient is compliant with anti-hypertensive therapy and denies side  effects to therapy.  Pt denies CP, SOB, dizziness, orthopnea, PND or edema.  Pt has hyperlipidemia and is compliant with lipid therapy.  Pt denies side effects of lipid therapy - specifically denies myalgia.  Pt has hypothyroidism - compliant with current regimen.  Denies side effects to therapy.  Denies any change in hair/nails/skin, constipation, unexplained wt changes.    Current thyroid hormone levels are normal  Clinically stable  On levothyroxine 25 mcg daily and reports good compliance with therapy.    History of obesity   Weight (03/26/21):  176 lbs  Weight (07/27/21) 159 lbs   Weight (11/09/21)      144 lbs   Weight (02/15/22)    139 lbs   Weight 07/12/22):    134 lbs       Problem List:  Patient Active Problem List   Diagnosis    Rheumatoid arthritis    Diabetes mellitus    Benign hypertension    Hyperlipidemia    Hypothyroidism    Anxiety and depression    Primary localized osteoarthrosis of ankle and foot    Polyneuropathy due to type 2 diabetes mellitus    Chronic pain of left knee    Stage 3a chronic kidney disease    Positive QuantiFERON-TB Gold test    Other specified arthritis, right shoulder    Osteoarthritis of right shoulder  Current Medications:  Current Outpatient Medications   Medication Sig Dispense Refill    acetaminophen (TYLENOL) 325 MG tablet Take 1 tablet (325 mg) by mouth every 4 (four) hours      celecoxib (CeleBREX) 100 MG capsule Take 1 capsule (100 mg) by mouth 2 (two) times daily      docusate sodium (COLACE) 100 MG capsule Take 1 capsule (100 mg) by mouth 3 (three) times daily      fexofenadine (ALLEGRA) 180 MG tablet Take 1 tablet (180 mg) by mouth every morning      folic acid (FOLVITE) 1 MG tablet TAKE 1 TABLET BY MOUTH EVERY DAY (Patient taking differently: every morning TAKE 1 TABLET BY MOUTH EVERY DAY) 90 tablet 3    gabapentin (NEURONTIN) 300 MG capsule Take 1 capsule (300 mg) by mouth 3 (three) times daily 270 capsule 0    glucose blood (Accu-Chek Guide) test strip Use as  instructed to test blood sugars once daily diagnosis code E11.65 100 each 1    hydroxychloroquine (PLAQUENIL) 200 MG tablet Take 1.5 tablets (300 mg) by mouth every morning TAKES 1 1/2 TABS      inFLIXimab (REMICADE IV) Infuse into the vein every 28 days LAST DOSE 8/213/23, NEXT DOSE AFTER SURGERY      levothyroxine (SYNTHROID) 25 MCG tablet Take 1 tablet (25 mcg) by mouth Once a day at 6:00am 90 tablet 1    losartan-hydrochlorothiazide (HYZAAR) 100-25 MG per tablet Take 1 tablet by mouth daily (Patient taking differently: Take 1 tablet by mouth every morning) 90 tablet 1    metoprolol succinate XL (TOPROL-XL) 50 MG 24 hr tablet Take 1 tablet (50 mg) by mouth daily (Patient taking differently: Take 1 tablet (50 mg) by mouth every morning) 90 tablet 1    Multiple Vitamins-Minerals (Womens 50+ Multi Vitamin/Min) Tab Take 1 tablet by mouth daily      naloxone (NARCAN) 4 MG/0.1ML nasal spray 1 spray intranasally. If pt does not respond or relapses into respiratory depression call 911. Give additional doses every 2-3 min. 2 each 0    potassium chloride (KLOR-CON M10) 10 MEQ CR tablet TAKE 1 TABLET(10 MEQ) BY MOUTH DAILY (Patient taking differently: every morning TAKE 1 TABLET(10 MEQ) BY MOUTH DAILY) 90 tablet 1    rosuvastatin (CRESTOR) 20 MG tablet Take 1 tablet (20 mg) by mouth daily (Patient taking differently: Take 1 tablet (20 mg) by mouth every morning) 90 tablet 0    Semaglutide, 1 MG/DOSE, (Ozempic, 1 MG/DOSE,) 4 MG/3ML Solution Pen-injector Inject 1 mg into the skin once a week (Patient taking differently: Inject 1 mg into the skin Once each week on Sunday morning) 3 mL 0    traMADol (ULTRAM) 50 MG tablet Take 1 tablet (50 mg) by mouth every 6 (six) hours as needed for Pain      traZODone (DESYREL) 50 MG tablet Take 1 tablet (50 mg) by mouth nightly 90 tablet 1     No current facility-administered medications for this visit.       Allergies:  Allergies   Allergen Reactions    Penicillin G      Reports allergic  as a child         Past Medical History:  Past Medical History:   Diagnosis Date    Anemia     H/H 11.2 / 33 06/17/22    Anxiety     SITUATIONAL    Diabetes mellitus     Disorder of thyroid  Ear, nose and throat disorder     ALLERGIES    Hyperlipidemia     Hypertension     Hypothyroidism     MANAGED BY ENDOCRINOL- STABLE    Neuropathy     CONTROLLED WITH GABAPENTIN    Other specified arthritis, right shoulder     PREOP DIAGNOSIS    Renal insufficiency     CKD 3 MONITORED BY PCP. CREAT 1.1 06/17/22    Rheumatoid arthritis     DR Kipp Brood- LAST SEEN FOE REMICADE 04/2022    Seasonal allergic rhinitis     ALLEGRA    Type 2 diabetes mellitus, controlled     MANAGED BY ENDOCRINOL NOTE 02/15/22 IN NOTES.  AM FBS 130-160. HA1C 02/15/22 6.2       Past Surgical History:  Past Surgical History:   Procedure Laterality Date    EYE SURGERY      CATARCTS    FINGER SURGERY Right     INDEX    HYSTERECTOMY      KNEE SURGERY Right 11/30/2019    knee replacement by Dr. Harlene Salts    right reverse shoulder arthroplasty  06/2022    ROTATOR CUFF REPAIR Left     SHOULDER ARTHROSCOPY Right 07/01/2022    Total reverse shoulder    SHOULDER SURGERY Right 2019    Rotator cuff    SHOULDER SURGERY Right     PUT BACK IN SOCKET       Family History:  Family History   Problem Relation Age of Onset    No known problems Mother     No known problems Father        Social History:  Social History     Socioeconomic History    Marital status: Widowed   Tobacco Use    Smoking status: Never    Smokeless tobacco: Never   Vaping Use    Vaping Use: Never used   Substance and Sexual Activity    Alcohol use: Never    Drug use: Yes     Types: Marijuana     Comment: Gummie edibles for pain and anxiety-WILL NOT TAKE 7 DAYS BEFORE DOS        The following sections were reviewed this encounter by the provider:   Tobacco  Allergies  Meds         Vitals:  BP 120/64   Wt 60.8 kg (134 lb)   BMI 25.33 kg/m      ROS: A complete 12 point ROS was obtained. Pertinent  positives and negatives as noted in HPI. All other systems are negative.    Physical Examination     General appearance: alert, appears stated age, cooperative and no distress  Eyes: conjunctivae/corneas clear.   Respiratory: no SOB  Neck: no thyromegaly   Psych: normal mood and affect  Neuro: AOX3      Assessment and Plan     1. Type 2 diabetes mellitus with diabetic polyneuropathy, with long-term current use of insulin  -   Lab Results   Component Value Date    HGBA1C 6.2 (H) 02/15/2022    HGBA1C 5.7 11/09/2021    HGBA1C 5.8 07/27/2021       Current  A1C is at goal (goal A1C <6.5%).Marland Kitchen  Continue current DM medication regimen. Recommend home glucometer testing once a day (pre-breakfast or 2 hrs post-prandial).  continue carb controlled diet and continue to stay physically active as tolerated    - Urine Microalbumin Random; Future  - Hemoglobin  A1C; Future  - Comprehensive metabolic panel; Future  - Lipid panel; Future    2. Mixed hyperlipidemia  - LDL at goal   - continue current Rosuvastatin 20 mg daily     - Comprehensive metabolic panel; Future  - Lipid panel; Future    3. Acquired hypothyroidism  - Patient denies marked or specific signs or symptoms suggesting either thyroid excess or deficiency.   - on Levothyroxine 25 mcg daily, reports good compliance with therapy   -Most recent thyroid hormone levels are normal  -Recheck thyroid hormone levels and adjust dose if needed upon review of labs     - TSH; Future  - T4, free; Future    4. Essential hypertension  - well controlled   - on Metoprolol and Hyzaar     - Comprehensive metabolic panel; Future     RTC in 4 months     Bynum Bellows MSN FNP-BC  [facility]

## 2022-07-13 ENCOUNTER — Encounter: Payer: Self-pay | Admitting: Orthopaedic Surgery

## 2022-07-21 ENCOUNTER — Other Ambulatory Visit (INDEPENDENT_AMBULATORY_CARE_PROVIDER_SITE_OTHER): Payer: Self-pay

## 2022-07-21 ENCOUNTER — Other Ambulatory Visit (FREE_STANDING_LABORATORY_FACILITY): Payer: Medicare Other

## 2022-07-21 DIAGNOSIS — E782 Mixed hyperlipidemia: Secondary | ICD-10-CM

## 2022-07-21 DIAGNOSIS — E1142 Type 2 diabetes mellitus with diabetic polyneuropathy: Secondary | ICD-10-CM

## 2022-07-21 DIAGNOSIS — I10 Essential (primary) hypertension: Secondary | ICD-10-CM

## 2022-07-21 DIAGNOSIS — Z794 Long term (current) use of insulin: Secondary | ICD-10-CM

## 2022-07-21 DIAGNOSIS — E039 Hypothyroidism, unspecified: Secondary | ICD-10-CM

## 2022-07-21 LAB — LIPID PANEL
Cholesterol / HDL Ratio: 2.1 Index
Cholesterol: 145 mg/dL (ref 0–199)
HDL: 70 mg/dL (ref 40–9999)
LDL Calculated: 60 mg/dL (ref 0–99)
Triglycerides: 73 mg/dL (ref 34–149)
VLDL Calculated: 15 mg/dL (ref 10–40)

## 2022-07-21 LAB — COMPREHENSIVE METABOLIC PANEL
ALT: 11 U/L (ref 0–55)
AST (SGOT): 19 U/L (ref 5–41)
Albumin/Globulin Ratio: 1.2 (ref 0.9–2.2)
Albumin: 3.6 g/dL (ref 3.5–5.0)
Alkaline Phosphatase: 83 U/L (ref 37–117)
Anion Gap: 10 (ref 5.0–15.0)
BUN: 14 mg/dL (ref 7.0–21.0)
Bilirubin, Total: 0.5 mg/dL (ref 0.2–1.2)
CO2: 27 mEq/L (ref 17–29)
Calcium: 9.6 mg/dL (ref 7.9–10.2)
Chloride: 105 mEq/L (ref 99–111)
Creatinine: 1 mg/dL (ref 0.4–1.0)
Globulin: 3 g/dL (ref 2.0–3.6)
Glucose: 177 mg/dL — ABNORMAL HIGH (ref 70–100)
Potassium: 4.1 mEq/L (ref 3.5–5.3)
Protein, Total: 6.6 g/dL (ref 6.0–8.3)
Sodium: 142 mEq/L (ref 135–145)
eGFR: 58.3 mL/min/{1.73_m2} — AB (ref >=60)

## 2022-07-21 LAB — URINE MICROALBUMIN, RANDOM
Urine Creatinine, Random: 137.1 mg/dL
Urine Microalbumin, Random: <5 ug/ml (ref 0.0–30.0)

## 2022-07-21 LAB — TSH: TSH: 1.08 u[IU]/mL (ref 0.35–4.94)

## 2022-07-21 LAB — HEMOGLOBIN A1C
Average Estimated Glucose: 122.6 mg/dL
Hemoglobin A1C: 5.9 % — ABNORMAL HIGH (ref 4.6–5.6)

## 2022-07-21 LAB — T4, FREE: T4 Free: 0.92 ng/dL (ref 0.69–1.48)

## 2022-07-21 LAB — HEMOLYSIS INDEX(SOFT): Hemolysis Index: 3 Index (ref 0–24)

## 2022-07-21 MED ORDER — OZEMPIC (1 MG/DOSE) 4 MG/3ML SC SOPN
1.0000 mg | PEN_INJECTOR | SUBCUTANEOUS | 3 refills | Status: DC
Start: 2022-07-21 — End: 2022-07-27

## 2022-07-21 NOTE — Telephone Encounter (Signed)
Rx request received :    Pharmacy :  [facility]   Medication: Ozempic 1 mg   Last visit: 07/12/22  Upcoming visit : 12/03/22    Medication clarification :

## 2022-07-22 ENCOUNTER — Other Ambulatory Visit: Payer: Medicare Other

## 2022-07-26 NOTE — Telephone Encounter (Signed)
Patient called stating that she cannot get the refill for ozempic since it is on back order.   She is asking if she cant please get an alternative medication refilled?  Thank you

## 2022-07-27 ENCOUNTER — Telehealth (INDEPENDENT_AMBULATORY_CARE_PROVIDER_SITE_OTHER): Payer: Self-pay | Admitting: "Endocrinology

## 2022-07-27 ENCOUNTER — Other Ambulatory Visit (INDEPENDENT_AMBULATORY_CARE_PROVIDER_SITE_OTHER): Payer: Self-pay | Admitting: "Endocrinology

## 2022-07-27 MED ORDER — TRULICITY 3 MG/0.5ML SC SOPN
3.0000 mg | PEN_INJECTOR | SUBCUTANEOUS | 0 refills | Status: DC
Start: 2022-07-27 — End: 2022-08-02

## 2022-07-27 NOTE — Telephone Encounter (Signed)
1.0 mg Pharmacy is [address] in [place]

## 2022-07-27 NOTE — Telephone Encounter (Addendum)
Pt is worried about not having enough insulin over the holiday. Pt stated that Joycelyn Man is available and she has called different stores and they have Monjaro. Pt is completely out.

## 2022-07-27 NOTE — Telephone Encounter (Signed)
Attempted to call patient but mailbox was not available. I wanted to confirm where she wanted moujaro sent and what does do they currently have .

## 2022-07-27 NOTE — Telephone Encounter (Signed)
Per call center , patient is requesting Roselee Culver to be sent to [facility] on file due to Ozempic is not available.

## 2022-07-28 NOTE — Telephone Encounter (Signed)
Patient made aware.

## 2022-07-30 ENCOUNTER — Telehealth: Payer: Self-pay | Admitting: "Endocrinology

## 2022-07-30 DIAGNOSIS — E11618 Type 2 diabetes mellitus with other diabetic arthropathy: Secondary | ICD-10-CM

## 2022-07-30 NOTE — Telephone Encounter (Addendum)
Pt says [facility] has not received trulicity rx. Pt has not had insulin for over 2 weeks and is very concerned , Please follow up with pharmacy       [facility] [address] [place] - [address] AT [facility] [place]  [address]  [place] [address]  Phone: [phone] Fax: [phone]    Please call pt back once updated

## 2022-08-02 ENCOUNTER — Encounter (INDEPENDENT_AMBULATORY_CARE_PROVIDER_SITE_OTHER): Payer: Self-pay | Admitting: "Endocrinology

## 2022-08-02 MED ORDER — TRULICITY 3 MG/0.5ML SC SOPN
3.0000 mg | PEN_INJECTOR | SUBCUTANEOUS | 0 refills | Status: DC
Start: 2022-08-02 — End: 2022-08-16

## 2022-08-02 NOTE — Telephone Encounter (Signed)
Pt called requesting that rx be sent to [facility] instead of [facility] since [facility] [place] [facility] still hasn't released rx.

## 2022-08-08 ENCOUNTER — Emergency Department
Admission: EM | Admit: 2022-08-08 | Discharge: 2022-08-08 | Disposition: A | Discharge status: Home / Self Care | Payer: Medicare Other | Attending: Emergency Medicine | Admitting: Emergency Medicine

## 2022-08-08 DIAGNOSIS — M549 Dorsalgia, unspecified: Secondary | ICD-10-CM

## 2022-08-08 DIAGNOSIS — M5459 Other low back pain: Secondary | ICD-10-CM | POA: Insufficient documentation

## 2022-08-08 LAB — URINALYSIS WITH REFLEX TO MICROSCOPIC EXAM - REFLEX TO CULTURE
Bilirubin, UA: NEGATIVE
Blood, UA: NEGATIVE
Glucose, UA: NEGATIVE
Ketones UA: NEGATIVE
Leukocyte Esterase, UA: NEGATIVE
Nitrite, UA: NEGATIVE
Specific Gravity UA: 1.017 (ref 1.001–1.035)
Urine pH: 5.5 (ref 5.0–8.0)
Urobilinogen, UA: NORMAL mg/dL

## 2022-08-08 MED ORDER — OXYCODONE-ACETAMINOPHEN 5-325 MG PO TABS
1.0000 | ORAL_TABLET | ORAL | 0 refills | Status: AC | PRN
Start: 2022-08-08 — End: 2022-08-15

## 2022-08-08 MED ORDER — DEXAMETHASONE SOD PHOSPHATE PF 10 MG/ML IJ SOLN
10.0000 mg | Freq: Once | INTRAMUSCULAR | Status: AC
Start: 2022-08-08 — End: 2022-08-08
  Administered 2022-08-08: 10 mg via INTRAMUSCULAR

## 2022-08-08 MED ORDER — KETOROLAC TROMETHAMINE 30 MG/ML IJ SOLN
30.0000 mg | Freq: Once | INTRAMUSCULAR | Status: DC
Start: 2022-08-08 — End: 2022-08-08
  Filled 2022-08-08: qty 1

## 2022-08-08 MED ORDER — DEXAMETHASONE SOD PHOSPHATE PF 10 MG/ML IJ SOLN
10.0000 mg | Freq: Once | INTRAMUSCULAR | Status: DC
Start: 2022-08-08 — End: 2022-08-08
  Filled 2022-08-08: qty 1

## 2022-08-08 MED ORDER — KETOROLAC TROMETHAMINE 30 MG/ML IJ SOLN
30.0000 mg | Freq: Once | INTRAMUSCULAR | Status: AC
Start: 2022-08-08 — End: 2022-08-08
  Administered 2022-08-08: 30 mg via INTRAMUSCULAR

## 2022-08-08 MED ORDER — METHYLPREDNISOLONE 4 MG PO TBPK
ORAL_TABLET | ORAL | 0 refills | Status: DC
Start: 2022-08-08 — End: 2022-08-16

## 2022-08-08 NOTE — ED Provider Notes (Shared)
History     Chief Complaint   Patient presents with   . Back Pain     HPI   76 year old female with past medical history of hypertension, hypothyroidism, chronic back pain, sciatica, rheumatoid arthritis presents to the ED with a complaint of left-sided back pain that radiates to her left thigh x 1 week.  Patient denies trauma or injury.  Reports pain is worse with weightbearing and denies any alleviating factors.  Reports taking over-the-counter pain meds with mild to no relief.  Patient denies loss of bowel or bowel control.  Denies lower extremity numbness, tingling, loss of sensation.  Reports pain is similar to her sciatica pain.  Past Medical History:   Diagnosis Date   . Anemia     H/H 11.2 / 33 06/17/22   . Anxiety     SITUATIONAL   . Diabetes mellitus    . Disorder of thyroid    . Ear, nose and throat disorder     ALLERGIES   . Hyperlipidemia    . Hypertension    . Hypothyroidism     MANAGED BY ENDOCRINOL- STABLE   . Neuropathy     CONTROLLED WITH GABAPENTIN   . Other specified arthritis, right shoulder     PREOP DIAGNOSIS   . Renal insufficiency     CKD 3 MONITORED BY PCP. CREAT 1.1 06/17/22   . Rheumatoid arthritis     DR Kipp Brood- LAST SEEN FOE REMICADE 04/2022   . Seasonal allergic rhinitis     ALLEGRA   . Type 2 diabetes mellitus, controlled     MANAGED BY ENDOCRINOL NOTE 02/15/22 IN NOTES.  AM FBS 130-160. HA1C 02/15/22 6.2       Past Surgical History:   Procedure Laterality Date   . ARTHROPLASTY SHOULDER REVERSE Right 07/01/2022    Procedure: ARTHROPLASTY, SHOULDER REVERSE;  Surgeon: Leeroy Cha, MD;  Location: [facility];  Service: Orthopedics;  Laterality: Right;   . EYE SURGERY      CATARCTS   . FINGER SURGERY Right     INDEX   . HYSTERECTOMY     . KNEE SURGERY Right 11/30/2019    knee replacement by Dr. Harlene Salts   . right reverse shoulder arthroplasty  06/2022   . ROTATOR CUFF REPAIR Left    . SHOULDER ARTHROSCOPY Right 07/01/2022    Total reverse shoulder   . SHOULDER SURGERY  Right 2019    Rotator cuff   . SHOULDER SURGERY Right     PUT BACK IN SOCKET       Family History   Problem Relation Age of Onset   . No known problems Mother    . No known problems Father        Social  Social History     Tobacco Use   . Smoking status: Never   . Smokeless tobacco: Never   Vaping Use   . Vaping Use: Never used   Substance Use Topics   . Alcohol use: Never   . Drug use: Yes     Types: Marijuana     Comment: Gummie edibles for pain and anxiety-WILL NOT TAKE 7 DAYS BEFORE DOS       .     Allergies   Allergen Reactions   . Penicillin G      Reports allergic as a child         Home Medications       Med List Status: In Progress Set By: Leonette Monarch,  Andree MoroJoseph Mari, RN at 08/08/2022  4:03 PM              acetaminophen (TYLENOL) 325 MG tablet     Take 1 tablet (325 mg) by mouth every 4 (four) hours     celecoxib (CeleBREX) 100 MG capsule     Take 1 capsule (100 mg) by mouth 2 (two) times daily     docusate sodium (COLACE) 100 MG capsule     Take 1 capsule (100 mg) by mouth 3 (three) times daily     Dulaglutide (Trulicity) 3 MG/0.5ML Solution Pen-injector     Inject 3 mg into the skin once a week     fexofenadine (ALLEGRA) 180 MG tablet     Take 1 tablet (180 mg) by mouth every morning     folic acid (FOLVITE) 1 MG tablet     TAKE 1 TABLET BY MOUTH EVERY DAY     Patient taking differently: every morning TAKE 1 TABLET BY MOUTH EVERY DAY     gabapentin (NEURONTIN) 300 MG capsule     Take 1 capsule (300 mg) by mouth 3 (three) times daily     glucose blood (Accu-Chek Guide) test strip     Use as instructed to test blood sugars once daily diagnosis code E11.65     hydroxychloroquine (PLAQUENIL) 200 MG tablet     Take 1.5 tablets (300 mg) by mouth every morning TAKES 1 1/2 TABS     inFLIXimab (REMICADE IV)     Infuse into the vein every 28 days LAST DOSE 8/213/23, NEXT DOSE AFTER SURGERY     levothyroxine (SYNTHROID) 25 MCG tablet     Take 1 tablet (25 mcg) by mouth Once a day at 6:00am     losartan-hydrochlorothiazide  (HYZAAR) 100-25 MG per tablet     Take 1 tablet by mouth daily     Patient taking differently: Take 1 tablet by mouth every morning     metoprolol succinate XL (TOPROL-XL) 50 MG 24 hr tablet     Take 1 tablet (50 mg) by mouth daily     Patient taking differently: Take 1 tablet (50 mg) by mouth every morning     Multiple Vitamins-Minerals (Womens 50+ Multi Vitamin/Min) Tab     Take 1 tablet by mouth daily     potassium chloride (KLOR-CON M10) 10 MEQ CR tablet     TAKE 1 TABLET(10 MEQ) BY MOUTH DAILY     Patient taking differently: every morning TAKE 1 TABLET(10 MEQ) BY MOUTH DAILY     rosuvastatin (CRESTOR) 20 MG tablet     Take 1 tablet (20 mg) by mouth daily     Patient taking differently: Take 1 tablet (20 mg) by mouth every morning     traMADol (ULTRAM) 50 MG tablet     Take 1 tablet (50 mg) by mouth every 6 (six) hours as needed for Pain     traZODone (DESYREL) 50 MG tablet     Take 1 tablet (50 mg) by mouth nightly          Flagged for Removal               naloxone (NARCAN) 4 MG/0.1ML nasal spray     1 spray intranasally. If pt does not respond or relapses into respiratory depression call 911. Give additional doses every 2-3 min.     Patient not taking: Reported on 07/12/2022             Review  of Systems   Constitutional: Negative.  Negative for chills and fever.   HENT:  Negative for congestion and rhinorrhea.    Eyes: Negative.  Negative for discharge and redness.   Respiratory: Negative.  Negative for cough and shortness of breath.    Cardiovascular: Negative.  Negative for chest pain, palpitations and leg swelling.   Gastrointestinal:  Negative for abdominal pain, nausea and vomiting.   Genitourinary:  Negative for dysuria, flank pain, frequency, hematuria and urgency.   Musculoskeletal:  Positive for back pain. Negative for arthralgias, gait problem, joint swelling, myalgias, neck pain and neck stiffness.   Skin:  Negative for color change, pallor, rash and wound.   Neurological:  Negative for dizziness,  syncope, weakness, light-headedness, numbness and headaches.   Hematological:  Does not bruise/bleed easily.   Psychiatric/Behavioral:  Negative for self-injury and sleep disturbance. The patient is not nervous/anxious.        Physical Exam    BP: 129/69, Heart Rate: (!) 47, Temp: 98.2 F (36.8 C), Resp Rate: 16, SpO2: 99 %, Weight: 59.9 kg    Physical Exam  Vitals and nursing note reviewed.   Constitutional:       General: She is not in acute distress.     Appearance: Normal appearance. She is well-developed. She is not diaphoretic.      Comments: Pt resting comfortably in NAD   HENT:      Head: Normocephalic and atraumatic.      Right Ear: External ear normal.      Left Ear: External ear normal.      Nose: Nose normal.      Mouth/Throat:      Pharynx: No oropharyngeal exudate.   Eyes:      General: No scleral icterus.        Right eye: No discharge.         Left eye: No discharge.      Conjunctiva/sclera: Conjunctivae normal.      Pupils: Pupils are equal, round, and reactive to light.   Neck:      Vascular: No JVD.      Trachea: No tracheal deviation.   Cardiovascular:      Rate and Rhythm: Normal rate and regular rhythm.   Pulmonary:      Effort: Pulmonary effort is normal. No respiratory distress.      Breath sounds: Normal breath sounds. No stridor. No wheezing, rhonchi or rales.   Chest:      Chest wall: No tenderness.   Abdominal:      General: Bowel sounds are normal. There is no distension.      Palpations: Abdomen is soft. There is no mass.      Tenderness: There is no abdominal tenderness. There is no right CVA tenderness, left CVA tenderness, guarding or rebound.   Musculoskeletal:         General: No tenderness. Normal range of motion.      Cervical back: Normal range of motion and neck supple.   Skin:     General: Skin is warm and dry.      Findings: No rash.   Neurological:      Mental Status: She is alert and oriented to person, place, and time.   Psychiatric:         Mood and Affect: Mood normal.          Behavior: Behavior normal.         Thought Content: Thought content normal.  Judgment: Judgment normal.         MDM and ED Course     ED Medication Orders (From admission, onward)      Start Ordered     Status Ordering Provider    08/08/22 1737 08/08/22 1736  ketorolac (TORADOL) injection 30 mg  Once        Route: Intravenous  Ordered Dose: 30 mg       Ordered Malakie Balis    08/08/22 1736 08/08/22 1735  dexAMETHasone (PF) (DECADRON) 10 mg/mL injection 10 mg  Once        Route: Intravenous  Ordered Dose: 10 mg       Ordered Crystall Donaldson               Medical Decision Making  Amount and/or Complexity of Data Reviewed  Labs: ordered.    Risk  Prescription drug management.                   Procedures    Clinical Impression & Disposition     Clinical Impression  Final diagnoses:   None        ED Disposition       None             New Prescriptions    No medications on file

## 2022-08-08 NOTE — ED Triage Notes (Signed)
Pt ambulatory to triage c/o low back pain the shoots down her left leg.This has been going on for a few days and has been progressing

## 2022-08-08 NOTE — Discharge Instructions (Signed)
return to the ER for any worsening or change, abdominal pain, vomiting, fever, numbness or weakness of the legs, difficulty controlling bladder or bowel, pain different than the pain you've had with your back flare ups before, new or concerning symptoms  avoid heavy lifting or prolonged sitting until better

## 2022-08-11 NOTE — Progress Notes (Deleted)
[facility] - [facility] [employer] [facility]                       Date of Exam: 08/16/2022 1:27 PM        Patient ID: Claudia Adams is a 76 y.o. female.  Attending Physician: Gaetano Hawthorne, DNP FNP        Chief Complaint:    No chief complaint on file.              HPI:    Pt presents for HTN f/u & Medicare Wellness:     Hypertension: Losartan- HCTZ 100-25mg  / Metoprolol 50mg    Average BPs at home:  Medication compliance:  Chest pain/pressure, shortness of breath?  Exercise/Diet:  Last micro: Nov 2023  Refill needed:    Labs today: CMP, micro (if not done within the last year (norma) or last 6 months (abnormal)    Lab Results       Component                Value               Date                       CREAT                    1.0                 07/21/2022                 CREAT                    1.1 (H)             07/02/2022                 EGFR                     58.3 (A)            07/21/2022                 EGFR                     52.0 (A)            07/02/2022                        Problem List:    Patient Active Problem List   Diagnosis   . Rheumatoid arthritis   . Diabetes mellitus   . Benign hypertension   . Hyperlipidemia   . Hypothyroidism   . Anxiety and depression   . Primary localized osteoarthrosis of ankle and foot   . Polyneuropathy due to type 2 diabetes mellitus   . Chronic pain of left knee   . Stage 3a chronic kidney disease   . Positive QuantiFERON-TB Gold test   . Other specified arthritis, right shoulder   . Osteoarthritis of right shoulder             Current Meds:    No outpatient medications have been marked as taking for the 08/16/22 encounter (Appointment) with 14/11/23, DNP FNP.          Allergies:    Allergies   Allergen Reactions   . Penicillin G      Reports  allergic as a child               Past Surgical History:    Past Surgical History:   Procedure Laterality Date   . ARTHROPLASTY SHOULDER REVERSE Right 07/01/2022    Procedure: ARTHROPLASTY,  SHOULDER REVERSE;  Surgeon: Leeroy ChaJohnson, Timothy S, MD;  Location: [place] [facility];  Service: Orthopedics;  Laterality: Right;   . EYE SURGERY      CATARCTS   . FINGER SURGERY Right     INDEX   . HYSTERECTOMY     . KNEE SURGERY Right 11/30/2019    knee replacement by Dr. Harlene Saltsavid Johnson   . right reverse shoulder arthroplasty  06/2022   . ROTATOR CUFF REPAIR Left    . SHOULDER ARTHROSCOPY Right 07/01/2022    Total reverse shoulder   . SHOULDER SURGERY Right 2019    Rotator cuff   . SHOULDER SURGERY Right     PUT BACK IN SOCKET           Family History:    Family History   Problem Relation Age of Onset   . No known problems Mother    . No known problems Father            Social History:    Social History     Tobacco Use   . Smoking status: Never   . Smokeless tobacco: Never   Vaping Use   . Vaping Use: Never used   Substance Use Topics   . Alcohol use: Never   . Drug use: Yes     Types: Marijuana     Comment: Gummie edibles for pain and anxiety-WILL NOT TAKE 7 DAYS BEFORE DOS           The following sections were reviewed this encounter by the provider:            Vital Signs:    There were no vitals taken for this visit.         ROS:    Review of Systems           Physical Exam:    Physical Exam         Assessment/Plan:    There are no diagnoses linked to this encounter.              Follow-up:    No follow-ups on file.         Gaetano Hawthorneachel Sparks, DNP FNP

## 2022-08-13 ENCOUNTER — Telehealth (INDEPENDENT_AMBULATORY_CARE_PROVIDER_SITE_OTHER): Payer: Medicare Other | Admitting: Family

## 2022-08-14 ENCOUNTER — Other Ambulatory Visit (INDEPENDENT_AMBULATORY_CARE_PROVIDER_SITE_OTHER): Payer: Self-pay | Admitting: Family

## 2022-08-14 DIAGNOSIS — I10 Essential (primary) hypertension: Secondary | ICD-10-CM

## 2022-08-14 NOTE — Telephone Encounter (Signed)
Pharmacy faxed refill request.  Thanks!  KT

## 2022-08-16 ENCOUNTER — Encounter (INDEPENDENT_AMBULATORY_CARE_PROVIDER_SITE_OTHER): Payer: Self-pay | Admitting: Family

## 2022-08-16 ENCOUNTER — Ambulatory Visit (INDEPENDENT_AMBULATORY_CARE_PROVIDER_SITE_OTHER): Payer: Medicare Other | Admitting: Family

## 2022-08-16 VITALS — BP 116/60 | HR 87 | Temp 97.9°F | Wt 136.0 lb

## 2022-08-16 DIAGNOSIS — F09 Unspecified mental disorder due to known physiological condition: Secondary | ICD-10-CM

## 2022-08-16 DIAGNOSIS — M19011 Primary osteoarthritis, right shoulder: Secondary | ICD-10-CM

## 2022-08-16 DIAGNOSIS — E039 Hypothyroidism, unspecified: Secondary | ICD-10-CM

## 2022-08-16 DIAGNOSIS — Z23 Encounter for immunization: Secondary | ICD-10-CM

## 2022-08-16 DIAGNOSIS — I10 Essential (primary) hypertension: Secondary | ICD-10-CM

## 2022-08-16 DIAGNOSIS — E11618 Type 2 diabetes mellitus with other diabetic arthropathy: Secondary | ICD-10-CM

## 2022-08-16 DIAGNOSIS — E782 Mixed hyperlipidemia: Secondary | ICD-10-CM

## 2022-08-16 DIAGNOSIS — N1831 Chronic kidney disease, stage 3a: Secondary | ICD-10-CM

## 2022-08-16 DIAGNOSIS — G629 Polyneuropathy, unspecified: Secondary | ICD-10-CM

## 2022-08-16 DIAGNOSIS — Z0000 Encounter for general adult medical examination without abnormal findings: Secondary | ICD-10-CM

## 2022-08-16 DIAGNOSIS — G4700 Insomnia, unspecified: Secondary | ICD-10-CM

## 2022-08-16 DIAGNOSIS — E876 Hypokalemia: Secondary | ICD-10-CM

## 2022-08-16 MED ORDER — LOSARTAN POTASSIUM-HCTZ 100-25 MG PO TABS
1.0000 | ORAL_TABLET | Freq: Every morning | ORAL | 1 refills | Status: DC
Start: 2022-08-16 — End: 2022-11-25

## 2022-08-16 MED ORDER — LEVOTHYROXINE SODIUM 25 MCG PO TABS
25.0000 ug | ORAL_TABLET | Freq: Every day | ORAL | 1 refills | Status: DC
Start: 2022-08-16 — End: 2023-02-17

## 2022-08-16 MED ORDER — POTASSIUM CHLORIDE CRYS ER 10 MEQ PO TBCR
EXTENDED_RELEASE_TABLET | ORAL | 1 refills | Status: DC
Start: 2022-08-16 — End: 2023-02-14

## 2022-08-16 MED ORDER — GABAPENTIN 300 MG PO CAPS
300.0000 mg | ORAL_CAPSULE | Freq: Three times a day (TID) | ORAL | 1 refills | Status: DC
Start: 2022-08-16 — End: 2023-02-14

## 2022-08-16 MED ORDER — METOPROLOL SUCCINATE ER 50 MG PO TB24
50.0000 mg | ORAL_TABLET | Freq: Every morning | ORAL | 1 refills | Status: DC
Start: 2022-08-16 — End: 2023-02-14

## 2022-08-16 MED ORDER — TRAZODONE HCL 50 MG PO TABS
50.0000 mg | ORAL_TABLET | Freq: Every evening | ORAL | 1 refills | Status: DC
Start: 2022-08-16 — End: 2023-02-14

## 2022-08-16 MED ORDER — TRULICITY 3 MG/0.5ML SC SOPN
3.0000 mg | PEN_INJECTOR | SUBCUTANEOUS | 1 refills | Status: DC
Start: 2022-08-16 — End: 2022-10-05

## 2022-08-16 MED ORDER — ROSUVASTATIN CALCIUM 20 MG PO TABS
20.0000 mg | ORAL_TABLET | Freq: Every evening | ORAL | 1 refills | Status: DC
Start: 2022-08-16 — End: 2023-02-14

## 2022-08-16 NOTE — Progress Notes (Signed)
[facility]            Claudia Adams is a 76 y.o. female who presents today for the following Medicare Wellness Visit:  []  Initial Preventive Physical Exam (IPPE) - "Welcome to Medicare" preventive visit (Vision Screening required)  []  Annual Wellness Visit - Initial  [x]  Annual Wellness Visit - Subsequent     Health Risk Assessment:   During the past month, how would you rate your general health?:  Fair  Which of the following tasks can you do without assistance - drive or take the bus alone; shop for groceries or clothes; prepare your own meals; do your own housework/laundry; handle your own finances/pay bills; eat, bathe or get around your home?: Drive or take the bus alone, Shop for groceries or clothes, Handle your own finances/pay bills, Eat, bathe, dress or get around your home  Which of the following problems have you been bothered by in the past month - dizzy when standing up; problems using the phone; feeling tired or fatigued; moderate or severe body pain?: Moderate or severe body pain, Feeling tired or fatigued  Do you exercise for about 20 minutes 3 or more days per week?:No  During the past month was someone available to help if you needed and wanted help?  For example, if you felt nervous, lonely, got sick and had to stay in bed, needed someone to talk to, needed help with daily chores or needed help just taking care of yourself.: Yes  Do you always wear a seat belt?: Yes  Do you have any trouble taking medications the way you have been told to take them?: No  Have you been given any information that can help you with keeping track of your medications?: Yes  Do you have trouble paying for your medications?: No  Have you been given any information that can help you with hazards in your house, such as scatter rugs, furniture, etc?: Yes  Do you feel unsteady when standing or walking?: Yes  Do you worry about falling?: Yes  Have you fallen two or  more times in the past year?: No  Did you suffer any injuries from your falls in the past year?: No     Care Team:   Patient Care Team:  Gaetano HawthorneSparks, Rachel, DNP FNP as PCP - General (Family Nurse Practitioner)  Sunday CornHaroun, Tayseer, MD as Consulting Physician (Rheumatology)  Candie Chromanelgado, Evangeline R, FNP as Nurse Practitioner (Family Nurse Practitioner)      Hospitalizations:   Hospitalization within past year: [x]  No  []  Yes    Diagnosis:      Screenings:       02/15/2022    12:01 AM 08/16/2022     1:32 PM 08/16/2022     1:36 PM   Ambulatory Screenings   Falls Risk: Fallen more than 2 times in past year   N   Falls Risk: Suffer any injuries?   N   Depression: PHQ2 Total Score 0 0    Depression: PHQ9 Total Score 0 0         Substance Use Disorder Screen:  In the past year, how often have you used the following?  1) Alcohol (For men, 5 or more drinks a day. For women, 4 or more drinks a day)  [x]  Never []  Once or Twice []  Monthly []  Weekly []  Daily or Almost Daily  2) Tobacco Products  [x]  Never []  Once or Twice []  Monthly []  Weekly []   Daily or Almost Daily  3) Prescription Drugs for Non-Medical Reasons  [x]  Never []  Once or Twice []  Monthly []  Weekly []  Daily or Almost Daily  4) Illegal Drugs  [x]  Never []  Once or Twice []  Monthly []  Weekly []  Daily or Almost Daily             Functional Ability/Level of Safety:   Falls Risk/Home Safety Assessment:  ( see HRA and Screenings sections for additional assessment)  Home Safety: []  Stair handrails  [x]  Skid-resistant rugs/remove throw rugs  [x]  Grab bars    [x]  Clear pathways between rooms  [x]  Proper lighting stairs/bathrooms/bedrooms  Get Up and Go (optional):  []   <20 secs  []   >20 secs    []   High risk for falls - Home Safety/Falls Risk Precautions reviewed with pt/family    Hearing Assessment:  Concerns for hearing loss: []  Yes  [x]   No  Hearing aids:   []   Right  []   Left  []   Bilateral   [x]   None  Whisper Test (optional):  []  Normal  []   Slightly decreased  []   Significantly  decreased    Exercise:  Frequency:  [x]   No formal exercise  []   1-2x/wk  []   3-4x/wk  []   >4x/wk  Duration:  [x]   15-30 mins/day  []   30-45 mins/day  []   45+ mins/day  Intensity:  [x]   Light  []   Moderate  []   Heavy      Activities of Daily Living:   ADL's Independent Minimal  Assistance Moderate  Assistance Total   Assistance   Bathing [x]  []  []  []    Dressing [x]  []  []  []    Mobility [x]  []  []  []    Transfer [x]  []  []  []    Eating [x]  []  []  []    Toileting [x]  []  []  []      IADL's Independent Minimal  Assistance Moderate  Assistance Total   Assistance   Phone [x]  []  []  []    Housekeeping [x]  []  []  []    Laundry [x]  []  []  []    Transportation [x]  []  []  []    Medications [x]  []  []  []    Finances [x]  []  []  []       ADL assistance: [x]  No assistance needed  []  Spouse  []  Sibling  []  Son  []  Daughter  []  Children  []  Home Health Aide []  Other:       Advance Care Planning:   Discussion of Advance Directives:   []  Advance Directive in chart  []  Advance Directive not in chart - requested to provide  [x]  No Advance Directive.  Form Provided  []  No Advance Directive.  Pt declines.  []  Not addressed today  []  Other:     Exam:   Temp 97.9 F (36.6 C)   Wt 61.7 kg (136 lb)   BMI 25.70 kg/m    Physical Exam          Evaluation of Cognitive Function:   Mood/affect: [x]  Appropriate  []   Other:   Appearance: [x]  Neatly groomed  [x]  Adequately nourished  []  Other:  Family member/caregiver input: []  Present - no concerns  []   Not present in room  []  Present - concerns:    Cognitive Assessment:  Mini-Cog Result (three word registration- banana, sunrise, chair / clock drawing):   []   > 3 points - negative screen for dementia   [x]  3 recalled words - negative screen for dementia   []  1-2 recalled words and normal clock draw -  negative for cognitive impairment   []  1-2 recalled words and abnormal clock draw - positive for cognitive impairment   []  0 recalled words - positive for cognitive impairment         Assessment/Plan:   1. Essential  hypertension    2. Medicare annual wellness visit, subsequent    3. Mixed hyperlipidemia           Elgie Collard, [place]  08/16/2022     The following sections were reviewed this encounter by the provider:         History:   Patient Active Problem List   Diagnosis    Rheumatoid arthritis    Diabetes mellitus    Benign hypertension    Hyperlipidemia    Hypothyroidism    Anxiety and depression    Primary localized osteoarthrosis of ankle and foot    Polyneuropathy due to type 2 diabetes mellitus    Chronic pain of left knee    Stage 3a chronic kidney disease    Positive QuantiFERON-TB Gold test    Other specified arthritis, right shoulder    Osteoarthritis of right shoulder      Past Medical History:   Diagnosis Date    Anemia     H/H 11.2 / 33 06/17/22    Anxiety     SITUATIONAL    Diabetes mellitus     Disorder of thyroid     Ear, nose and throat disorder     ALLERGIES    Hyperlipidemia     Hypertension     Hypothyroidism     MANAGED BY ENDOCRINOL- STABLE    Neuropathy     CONTROLLED WITH GABAPENTIN    Other specified arthritis, right shoulder     PREOP DIAGNOSIS    Renal insufficiency     CKD 3 MONITORED BY PCP. CREAT 1.1 06/17/22    Rheumatoid arthritis     DR Kipp Brood- LAST SEEN FOE REMICADE 04/2022    Seasonal allergic rhinitis     ALLEGRA    Type 2 diabetes mellitus, controlled     MANAGED BY ENDOCRINOL NOTE 02/15/22 IN NOTES.  AM FBS 130-160. HA1C 02/15/22 6.2     Past Surgical History:   Procedure Laterality Date    ARTHROPLASTY SHOULDER REVERSE Right 07/01/2022    Procedure: ARTHROPLASTY, SHOULDER REVERSE;  Surgeon: Leeroy Cha, MD;  Location: [facility];  Service: Orthopedics;  Laterality: Right;    EYE SURGERY      CATARCTS    FINGER SURGERY Right     INDEX    HYSTERECTOMY      KNEE SURGERY Right 11/30/2019    knee replacement by Dr. Harlene Salts    right reverse shoulder arthroplasty  06/2022    ROTATOR CUFF REPAIR Left     SHOULDER ARTHROSCOPY Right 07/01/2022    Total reverse shoulder    SHOULDER  SURGERY Right 2019    Rotator cuff    SHOULDER SURGERY Right     PUT BACK IN SOCKET     Allergies   Allergen Reactions    Penicillin G      Reports allergic as a child        Outpatient Medications Marked as Taking for the 08/16/22 encounter (Office Visit) with Gaetano Hawthorne, DNP FNP   Medication Sig Dispense Refill    acetaminophen (TYLENOL) 325 MG tablet Take 1 tablet (325 mg) by mouth every 4 (four) hours      Diclofenac Sodium CR 100 MG 24 hr  tablet Take by mouth daily      docusate sodium (COLACE) 100 MG capsule Take 1 capsule (100 mg) by mouth 3 (three) times daily      Dulaglutide (Trulicity) 3 MG/0.5ML Solution Pen-injector Inject 3 mg into the skin once a week 6 mL 0    fexofenadine (ALLEGRA) 180 MG tablet Take 1 tablet (180 mg) by mouth every morning      folic acid (FOLVITE) 1 MG tablet TAKE 1 TABLET BY MOUTH EVERY DAY (Patient taking differently: every morning TAKE 1 TABLET BY MOUTH EVERY DAY) 90 tablet 3    gabapentin (NEURONTIN) 300 MG capsule Take 1 capsule (300 mg) by mouth 3 (three) times daily 270 capsule 0    glucose blood (Accu-Chek Guide) test strip Use as instructed to test blood sugars once daily diagnosis code E11.65 100 each 1    hydroxychloroquine (PLAQUENIL) 200 MG tablet Take 1.5 tablets (300 mg) by mouth every morning TAKES 1 1/2 TABS      inFLIXimab (REMICADE IV) Infuse into the vein every 28 days LAST DOSE 8/213/23, NEXT DOSE AFTER SURGERY      levothyroxine (SYNTHROID) 25 MCG tablet Take 1 tablet (25 mcg) by mouth Once a day at 6:00am 90 tablet 1    losartan-hydrochlorothiazide (HYZAAR) 100-25 MG per tablet Take 1 tablet by mouth daily (Patient taking differently: Take 1 tablet by mouth every morning) 90 tablet 1    metoprolol succinate XL (TOPROL-XL) 50 MG 24 hr tablet Take 1 tablet (50 mg) by mouth daily (Patient taking differently: Take 1 tablet (50 mg) by mouth every morning) 90 tablet 1    Multiple Vitamins-Minerals (Womens 50+ Multi Vitamin/Min) Tab Take 1 tablet by mouth daily       potassium chloride (KLOR-CON M10) 10 MEQ CR tablet TAKE 1 TABLET(10 MEQ) BY MOUTH DAILY (Patient taking differently: every morning TAKE 1 TABLET(10 MEQ) BY MOUTH DAILY) 90 tablet 1    rosuvastatin (CRESTOR) 20 MG tablet Take 1 tablet (20 mg) by mouth daily (Patient taking differently: Take 1 tablet (20 mg) by mouth every morning) 90 tablet 0    traMADol (ULTRAM) 50 MG tablet Take 1 tablet (50 mg) by mouth every 6 (six) hours as needed for Pain      traZODone (DESYREL) 50 MG tablet Take 1 tablet (50 mg) by mouth nightly 90 tablet 1     Social History     Tobacco Use    Smoking status: Never    Smokeless tobacco: Never   Vaping Use    Vaping Use: Never used   Substance Use Topics    Alcohol use: Never    Drug use: Yes     Types: Marijuana     Comment: Gummie edibles for pain and anxiety-WILL NOT TAKE 7 DAYS BEFORE DOS      Family History   Problem Relation Age of Onset    No known problems Mother     No known problems Father            =========================================================================    Additional Documentation:

## 2022-08-16 NOTE — Patient Instructions (Signed)
MEDICARE WELLNESS PERSONAL PREVENTION PLAN   As part of the Medicare Wellness portion of your visit today, we are providing you with this personalized preventative plan of care. We have listed below some of the preventative services that are recommended for patients based upon their age and gender. These recommendations are taken directly from the [place] [employer] (USPSTF) and the [employer] (ACIP).     Health Maintenance   Topic Date Due    Advance Directive on File  Never done    Shingrix Vaccine 50+ (1) Never done    HEPATITIS C SCREENING  Never done    DM OPHTHALMOLOGY EXAM  01/13/2022    COVID-19 Vaccine (7 - 2023-24 season) 05/07/2022    FALLS RISK ANNUAL  08/13/2022    Medicare Annual Wellness Visit  08/13/2022    HEMOGLOBIN A1C ANNUAL  07/22/2023    URINE MICROALBUMIN  07/22/2023    DEPRESSION SCREENING  08/17/2023    INFLUENZA VACCINE  Completed    DXA Scan  Completed    Tetanus Ten-Year  Discontinued    Pneumonia Vaccine Age 64+  Discontinued       Health Maintenance Topics with due status: Overdue       Topic Date Due    Advance Directive on File Never done    Shingrix Vaccine 50+ Never done    HEPATITIS C SCREENING Never done    DM OPHTHALMOLOGY EXAM 01/13/2022    COVID-19 Vaccine 05/07/2022    FALLS RISK ANNUAL 08/13/2022    Medicare Annual Wellness Visit 08/13/2022        Immunization History   Administered Date(s) Administered    COVID-19 mRNA BIVALENT vaccine 12 years and above [employer]) 30 mcg/0.3 mL 06/09/2021    COVID-19 mRNA MONOVALENT vaccine PRIMARY SERIES 12 years and above [employer]) 30 mcg/0.3 mL (DILUTE BEFORE USE) 09/08/2019, 09/27/2019, 10/18/2019, 07/02/2020    COVID-19 mRNA MONOVALENT vaccine PRIMARY SERIES 12 years and above [employer]) 30 mcg/0.3 mL (DO NOT DILUTE) 12/22/2020    INFLUENZA HIGH DOSE 65 YRS+ Quad 0.7 mL 05/06/2020, 05/06/2022    Influenza (Im) Preservative Free 05/17/2015, 05/16/2016, 04/26/2020    Influenza (Im)  Preserved TRIVALENT VACCINE 06/13/1998, 08/03/2000, 06/30/2001    Influenza vaccine, quadrivalent, 6 months and older (Afluria/Fluzone), multi-dose, 5 mL 04/26/2020, 05/27/2020    Pneumococcal 23 valent 07/07/2000, 08/03/2000, 06/07/2015    Pneumococcal Conjugate 13-Valent 06/06/2014    Tdap 10/25/2017, 03/19/2021        Your major risk factors:   Recommendations for improvement:      The list below includes many common screening recommendations but is not meant to be comprehensive. You may be eligible for other preventative services depending upon your personal risk factors.   Colorectal Cancer Screening - All adults age 71-75 should undergo periodic colorectal cancer screening. The decision to screen for colorectal cancer in adults aged 104 to 37 years should be an individual one,taking into account your overall health and prior screening history.   Breast Cancer Screening - Women age 59-74 should have mammograms every other year (please note that this recommendation may not be appropriate for every woman - your physician can answer specific questions you may have). The USPSTF concludes that the current evidence is insufficient to assess the balance of benefits and harms of screening mammography in women aged 41 years or older.    Cervical Cancer Screening - Women over 38 do not require pap smears as long as prior screening has been  normal and are not otherwise at high risk for cervical cancer. For women aged 57 to 73 years, the USPSTF recommends screening every 3 years with cervical cytology alone, every 5 years with high-risk human papillomavirus (hrHPV) testing alone, or every 5 years with hrHPV testing in combination with cytology (cotesting).   Osteoporosis Screening -  The USPSTF recommends screening for osteoporosis with bone measurement testing to prevent osteoporotic fractures in women 65 years and older.  Hepatitis C Screening - Recommend screening for hepatitis C virus (HCV) infection in all adults aged  63 to 77 years.  Lung cancer Screening - Recommend annual screening for lung cancer with low-dose computed tomography (LDCT) in adults ages 28 to 67 years who have a 20 pack-year smoking history and currently smoke or have quit within the past 15 years.  Recommended Vaccinations   Influenza one dose annually   Tetanus/diphtheria one booster every 10 years   Zoster/Shingles - Shingrix two doses after age 27 (second dose given 2-6 months after first dose)  Pneumovax (PPSV23) one dose for adults aged ?65 years  Prevnar(PCV13) shared clinical decision-making is recommended regarding administration of this vaccine to persons aged ?65 years who do not have an immunocompromising condition, cerebrospinal fluid leak, or cochlear implant.     PERSONAL PREVENTION PLAN   Your Personal Prevention Plan is based on your overall health and your responses to the health questionnaire you completed. The following information is for you to review in addition to the recommendations, referrals, and tests we have discussed at your visit.     Physical Activity:   Physical activity can help you maintain a healthy weight, prevent or control illness, reduce stress, and sleep better. It can also help you improve your balance to avoid falls. Try to build up to and maintain a total of 30 minutes of activity each day. If you are able, try walking, doing yard or housework, and taking the stairs more often. You can also strengthen your muscles with exercises done while sitting or lying down.   Emotional Health:   Feeling "down in the dumps" or anxious every now and then is a natural part of life. If this feeling lasts for a few weeks or more, talk with me as soon as possible. It could be a sign of a problem that needs treatment. There are many types of treatment available.   Falls:   You can reduce your risk of falling by making changes in your home. Remove items that may cause tripping, improve lighting, and consider installing grab bars.   Talk  with me if you have problems with balance and walking. To prevent falls, you may need your vision, hearing, or blood pressure checked. Exercises to improve your strength and balance, or using a cane or walker, may help. Review your medicines with me at every visit, because some can affect balance. Please be sure to let me know if you fall or are fearful you may fall.   Urinary Leakage:   Urine leakage is common, but it is not a normal part of aging. Talk with me about any urine leakage so that the cause can be found and treated. Treatment can include bladder training, exercises, medicine or surgery.   Pain:   We all have aches and pains at times, but chronic pain can change how you feel and live every day. Please talk with me about any symptoms of chronic pain so that we can determine how best to treat.   Sleep:  Getting a good night's sleep is vital to your health and well-being and can help prevent or manage health problems. Often, sleep can be improved by changing behaviors, including when you go to bed and what you do before bed. Sleep apnea can cause problems such as struggling to stay awake during the day. Please let me know if you would like to learn more about improving your sleep and/or think you may have sleep apnea.   Seat Belt:   Please remember to wear a seat belt when driving or riding in a vehicle. It is one of the most important things you can do to stay safe in a car.   Nutrition:   Remember to eat plenty of fruits, vegetables, whole grains, and dairy. Drink at least 64 ounces (8 full glasses) of water a day, unless you have been advised to limit fluids.   Alcohol:   Alcohol can have a greater effect on older people, who may feel its effects at a lower amount. Older people should limit alcoholic drinks (no more than one a day for women and no more than two a day for men). Please let me know if alcohol use becomes a problem.   Tobacco:   Not smoking or using other forms of tobacco is one of the most  important things you can do for your health. Here is some more information about the importance of quitting smoking and how to quit smoking - [employer]  Advance Directives:   There may come a time when medical decisions need to be made on your behalf. Please talk with your family, and with me, about your wishes. It is important to provide information about your decisions, and any formal advance directives, for your medical record. Here is additional information on advanced directives - [employer]Check.com.ee.html  Additional Support:   Sometimes it can be challenging to manage all aspects of daily life. Finding the right support can help you maintain or improve your health and independence. Please let me know if you would like to talk further about finding resources to assist you.

## 2022-08-16 NOTE — Progress Notes (Signed)
[facility] - [facility] [place] [facility]                       Date of Exam: 08/16/2022 2:51 PM        Patient ID: Claudia Adams is a 76 y.o. female.  Attending Physician: Gaetano Hawthorne, DNP FNP        Chief Complaint:    Chief Complaint   Patient presents with    Hypertension    Medicare Annual Wellness Visit               HPI:    Pt presents for HTN f/u & Medicare Wellness:     Hypertension: Losartan- HCTZ 100-25mg  / Metoprolol 50mg    Average BPs at home: pt reports it being in normal range; 120/60s  Medication compliance: as presc  Chest pain/pressure, shortness of breath? no  Exercise/Diet: pt has pinched nerve and has a hard time walking currently; no processed foods  Last micro: Nov 2023  Refill needed: no    Lab Results       Component                Value               Date                       CREAT                    1.0                 07/21/2022                 CREAT                    1.1 (H)             07/02/2022                 EGFR                     58.3 (A)            07/21/2022                 EGFR                     52.0 (A)            07/02/2022                Mammo: pt will call back due to her schedule  TDAP: 2022  Colonoscopy: pt is scheduling   DXA(2 years): 09/2021  Pap smear (under 65 if they have 3 normal):   Shingles (50+): pt reports UTD  Pneumonia (65+): 23 in 2016  Flu: pt reports UTD   Covid: ordering               Problem List:    Patient Active Problem List   Diagnosis    Rheumatoid arthritis    Diabetes mellitus    Benign hypertension    Hyperlipidemia    Hypothyroidism    Anxiety and depression    Primary localized osteoarthrosis of ankle and foot    Polyneuropathy due to type 2 diabetes mellitus    Chronic pain of left knee    Stage 3a chronic kidney disease    Positive QuantiFERON-TB Gold  test    Other specified arthritis, right shoulder    Osteoarthritis of right shoulder             Current Meds:    Outpatient Medications Marked as Taking for  the 08/16/22 encounter (Office Visit) with Gaetano HawthorneSparks, Beecher Furio, DNP FNP   Medication Sig Dispense Refill    acetaminophen (TYLENOL) 325 MG tablet Take 1 tablet (325 mg) by mouth every 4 (four) hours      Diclofenac Sodium CR 100 MG 24 hr tablet Take by mouth daily      docusate sodium (COLACE) 100 MG capsule Take 1 capsule (100 mg) by mouth 3 (three) times daily      fexofenadine (ALLEGRA) 180 MG tablet Take 1 tablet (180 mg) by mouth every morning      folic acid (FOLVITE) 1 MG tablet TAKE 1 TABLET BY MOUTH EVERY DAY (Patient taking differently: every morning TAKE 1 TABLET BY MOUTH EVERY DAY) 90 tablet 3    glucose blood (Accu-Chek Guide) test strip Use as instructed to test blood sugars once daily diagnosis code E11.65 100 each 1    hydroxychloroquine (PLAQUENIL) 200 MG tablet Take 1.5 tablets (300 mg) by mouth every morning TAKES 1 1/2 TABS      inFLIXimab (REMICADE IV) Infuse into the vein every 28 days LAST DOSE 8/213/23, NEXT DOSE AFTER SURGERY      Multiple Vitamins-Minerals (Womens 50+ Multi Vitamin/Min) Tab Take 1 tablet by mouth daily      traMADol (ULTRAM) 50 MG tablet Take 1 tablet (50 mg) by mouth every 6 (six) hours as needed for Pain      [DISCONTINUED] Dulaglutide (Trulicity) 3 MG/0.5ML Solution Pen-injector Inject 3 mg into the skin once a week 6 mL 0    [DISCONTINUED] gabapentin (NEURONTIN) 300 MG capsule Take 1 capsule (300 mg) by mouth 3 (three) times daily 270 capsule 0    [DISCONTINUED] levothyroxine (SYNTHROID) 25 MCG tablet Take 1 tablet (25 mcg) by mouth Once a day at 6:00am 90 tablet 1    [DISCONTINUED] losartan-hydrochlorothiazide (HYZAAR) 100-25 MG per tablet Take 1 tablet by mouth daily (Patient taking differently: Take 1 tablet by mouth every morning) 90 tablet 1    [DISCONTINUED] metoprolol succinate XL (TOPROL-XL) 50 MG 24 hr tablet Take 1 tablet (50 mg) by mouth daily (Patient taking differently: Take 1 tablet (50 mg) by mouth every morning) 90 tablet 1    [DISCONTINUED] potassium  chloride (KLOR-CON M10) 10 MEQ CR tablet TAKE 1 TABLET(10 MEQ) BY MOUTH DAILY (Patient taking differently: every morning TAKE 1 TABLET(10 MEQ) BY MOUTH DAILY) 90 tablet 1    [DISCONTINUED] rosuvastatin (CRESTOR) 20 MG tablet Take 1 tablet (20 mg) by mouth daily (Patient taking differently: Take 1 tablet (20 mg) by mouth every morning) 90 tablet 0    [DISCONTINUED] traZODone (DESYREL) 50 MG tablet Take 1 tablet (50 mg) by mouth nightly 90 tablet 1            Allergies:    Allergies   Allergen Reactions    Penicillin G      Reports allergic as a child               Past Surgical History:    Past Surgical History:   Procedure Laterality Date    ARTHROPLASTY SHOULDER REVERSE Right 07/01/2022    Procedure: ARTHROPLASTY, SHOULDER REVERSE;  Surgeon: Leeroy ChaJohnson, Timothy S, MD;  Location: [place] [facility] OR;  Service: Orthopedics;  Laterality: Right;    EYE SURGERY  CATARCTS    FINGER SURGERY Right     INDEX    HYSTERECTOMY      KNEE SURGERY Right 11/30/2019    knee replacement by Dr. Harlene Salts    right reverse shoulder arthroplasty  06/2022    ROTATOR CUFF REPAIR Left     SHOULDER ARTHROSCOPY Right 07/01/2022    Total reverse shoulder    SHOULDER SURGERY Right 2019    Rotator cuff    SHOULDER SURGERY Right     PUT BACK IN SOCKET           Family History:    Family History   Problem Relation Age of Onset    No known problems Mother     No known problems Father            Social History:    Social History     Tobacco Use    Smoking status: Never    Smokeless tobacco: Never   Vaping Use    Vaping Use: Never used   Substance Use Topics    Alcohol use: Never    Drug use: Yes     Types: Marijuana     Comment: Gummie edibles for pain and anxiety-WILL NOT TAKE 7 DAYS BEFORE DOS           The following sections were reviewed this encounter by the provider:   Tobacco  Allergies  Meds  Problems  Med Hx  Surg Hx  Fam Hx             Vital Signs:    BP 116/60   Pulse 87   Temp 97.9 F (36.6 C)   Wt 61.7 kg (136 lb)   SpO2  97%   BMI 25.70 kg/m          ROS:    Review of Systems   Constitutional:  Positive for fatigue (secondary to treatment from RA). Negative for chills and unexpected weight change.   HENT:  Negative for congestion and dental problem.    Eyes:  Negative for visual disturbance.   Respiratory:  Negative for cough and shortness of breath.    Cardiovascular:  Negative for chest pain, palpitations and leg swelling.   Gastrointestinal:  Negative for abdominal pain, blood in stool, constipation and diarrhea.   Endocrine: Negative for cold intolerance and heat intolerance.   Genitourinary:  Negative for difficulty urinating and hematuria.   Musculoskeletal:  Positive for arthralgias and back pain. Negative for myalgias.   Skin:  Negative for rash (no acute skin changes).   Allergic/Immunologic: Positive for environmental allergies.   Neurological:  Negative for dizziness, tremors and headaches.   Hematological:  Does not bruise/bleed easily.   Psychiatric/Behavioral:  Negative for self-injury, sleep disturbance and suicidal ideas. The patient is not nervous/anxious.    All other systems reviewed and are negative.             Physical Exam:    Physical Exam  Vitals and nursing note reviewed.   Constitutional:       Appearance: Normal appearance.   HENT:      Head: Normocephalic.      Right Ear: Tympanic membrane normal.      Left Ear: Tympanic membrane normal.   Eyes:      General: Lids are normal.      Extraocular Movements: Extraocular movements intact.      Conjunctiva/sclera: Conjunctivae normal.   Neck:      Thyroid: No thyroid mass, thyromegaly or  thyroid tenderness.   Cardiovascular:      Rate and Rhythm: Normal rate and regular rhythm.      Pulses: Normal pulses.      Heart sounds: Normal heart sounds. No murmur heard.  Pulmonary:      Effort: Pulmonary effort is normal.      Breath sounds: Normal breath sounds and air entry. No stridor or decreased air movement.   Abdominal:      General: Bowel sounds are normal.  There is no distension.      Palpations: Abdomen is soft.      Tenderness: There is no abdominal tenderness. There is no right CVA tenderness or left CVA tenderness.      Hernia: No hernia is present.   Musculoskeletal:      Cervical back: Normal range of motion and neck supple.      Right lower leg: No edema.      Left lower leg: No edema.   Lymphadenopathy:      Cervical: No cervical adenopathy.      Right cervical: No superficial, deep or posterior cervical adenopathy.     Left cervical: No superficial, deep or posterior cervical adenopathy.      Upper Body:      Right upper body: No supraclavicular adenopathy.      Left upper body: No supraclavicular adenopathy.   Skin:     General: Skin is warm.   Neurological:      Mental Status: She is alert.      Cranial Nerves: No cranial nerve deficit or facial asymmetry.      Motor: Motor function is intact.      Coordination: Coordination is intact.      Deep Tendon Reflexes:      Reflex Scores:       Patellar reflexes are 2+ on the right side and 2+ on the left side.  Psychiatric:         Attention and Perception: Attention normal.         Mood and Affect: Mood normal.         Speech: Speech normal.         Behavior: Behavior normal.         Thought Content: Thought content normal.              Assessment/Plan:    1. Medicare annual wellness visit, subsequent    2. Essential hypertension  - losartan-hydrochlorothiazide (HYZAAR) 100-25 MG per tablet; Take 1 tablet by mouth every morning  Dispense: 100 tablet; Refill: 1  - metoprolol succinate XL (TOPROL-XL) 50 MG 24 hr tablet; Take 1 tablet (50 mg) by mouth every morning  Dispense: 100 tablet; Refill: 1  -Well controlled  -Due to see Cardiologist, has referral, will make apt after the holidays    3. Mixed hyperlipidemia  - rosuvastatin (CRESTOR) 20 MG tablet; Take 1 tablet (20 mg) by mouth nightly  Dispense: 100 tablet; Refill: 1  -Discussed taking medication at bedtime    4. Need for vaccination  - Will get covid, rsv  and shingrix at pharmacy    5. Hypokalemia  - potassium chloride (KLOR-CON M10) 10 MEQ CR tablet; TAKE 1 TABLET(10 MEQ) BY MOUTH DAILY  Dispense: 100 tablet; Refill: 1  -K+ 4.1 07/2022    6. Hypothyroidism, unspecified type  - levothyroxine (SYNTHROID) 25 MCG tablet; Take 1 tablet (25 mcg) by mouth Once a day at 6:00am  Dispense: 100 tablet; Refill: 1  -Normal 07/2022, will repeat  in 6 months    7. Insomnia, unspecified type  - traZODone (DESYREL) 50 MG tablet; Take 1 tablet (50 mg) by mouth nightly  Dispense: 100 tablet; Refill: 1  -Uses nightly    8. Neuropathy  - gabapentin (NEURONTIN) 300 MG capsule; Take 1 capsule (300 mg) by mouth 3 (three) times daily  Dispense: 270 capsule; Refill: 1  -Risk of medication discussed  -She wants me to take over medication  -Risk of fall detailed with patient. Will try to wean down medication  -Neuropathy secondary to DM and Remicade    9. Type 2 diabetes mellitus with other diabetic arthropathy, without long-term current use of insulin  - Dulaglutide (Trulicity) 3 MG/0.5ML Solution Pen-injector; Inject 3 mg into the skin once a week  Dispense: 6 mL; Refill: 1  -She would like me to take over medication. A1c stable 07/2022    10. Stage 3a chronic kidney disease  Gfr improved, 58 07/2022  -No longer sees nephrologist    11. Primary osteoarthritis of right shoulder  -S/P surgery 06/2022  -Doing PT    12. Cognitive disorder  - Referral to Neurology ([place]); Future  -14/30 on MOCHA and clock drawing showed poor executive function with  multiple arrows and mis-spacing of numbers  -Does not feel she has memory decline, but will send to neurologist for further testing    A total of 65 minutes was spent in coordination of care, chart prep and face to face counseling           Follow-up:    Return in about 6 months (around 02/15/2023) for Med Refill.         Gaetano Hawthorne, DNP FNP

## 2022-08-17 ENCOUNTER — Encounter (INDEPENDENT_AMBULATORY_CARE_PROVIDER_SITE_OTHER): Payer: Self-pay | Admitting: Family

## 2022-08-23 ENCOUNTER — Encounter (INDEPENDENT_AMBULATORY_CARE_PROVIDER_SITE_OTHER): Payer: Self-pay | Admitting: Family

## 2022-09-14 ENCOUNTER — Encounter (INDEPENDENT_AMBULATORY_CARE_PROVIDER_SITE_OTHER): Payer: Self-pay | Admitting: Family

## 2022-09-29 ENCOUNTER — Telehealth (INDEPENDENT_AMBULATORY_CARE_PROVIDER_SITE_OTHER): Payer: Self-pay | Admitting: Family

## 2022-09-29 NOTE — Telephone Encounter (Signed)
Patient states that trulicity is not working well for her DM and wants to know if she can change to [facility] or return to taking ozempic.  Thanks/sa

## 2022-10-01 NOTE — Telephone Encounter (Signed)
Attempted to call x 2   LVM to contact our office in regards message.   Office phone number provided in the voicemail .

## 2022-10-01 NOTE — Telephone Encounter (Signed)
Patient states she feels like the trulicity is not giviing her the numbers/results she had been getting with ozempic.  She states she was taking ozempic but had to discontinue because she couldn't get it anymore due to it's popularity with people trying to lose weight.   She also wants to know if she can get refill of gabapentin.  Thanks//sa

## 2022-10-01 NOTE — Telephone Encounter (Signed)
Pt stated that her A1c was 5.9 while she was on Ozempic. Scheduled VV with RS to discuss further. Pt stated that her gabapentin was from October, so I explained that RS had sent in another rx in December to [facility] which she can call the pharmacy and ask them to fill. Pt stated that she would do so and expressed understanding. EJ

## 2022-10-05 ENCOUNTER — Telehealth (INDEPENDENT_AMBULATORY_CARE_PROVIDER_SITE_OTHER): Payer: Medicare Other | Admitting: Family

## 2022-10-05 ENCOUNTER — Encounter (INDEPENDENT_AMBULATORY_CARE_PROVIDER_SITE_OTHER): Payer: Self-pay | Admitting: Family

## 2022-10-05 VITALS — Ht 61.0 in | Wt 135.0 lb

## 2022-10-05 DIAGNOSIS — E11618 Type 2 diabetes mellitus with other diabetic arthropathy: Secondary | ICD-10-CM

## 2022-10-05 MED ORDER — MOUNJARO 5 MG/0.5ML SC SOPN
5.0000 mg | PEN_INJECTOR | SUBCUTANEOUS | 1 refills | Status: DC
Start: 2022-10-05 — End: 2023-02-14

## 2022-10-05 NOTE — Patient Instructions (Signed)
Facts About Diabetes  What is diabetes?  When you have diabetes, your body doesn't make enough insulin. Or it can't use the insulin it makes. Insulin is a hormone. It helps sugar enter the cells to be used as energy. Without insulin, too much sugar collects in your blood.   There are 3 types of diabetes. They are type 1, type 2, and gestational diabetes.   What is prediabetes?  Prediabetes often happens before type 2 diabetes. Prediabetes is when blood sugar levels are higher than normal. But not as high as for diabetes. Many people with prediabetes will have type 2 diabetes within 10 years. More than 1 in 3 [place] adults have prediabetes. And most of them don't know the risks they face. Prediabetes also raises the risk for heart disease and stroke.   You can delay type 2 diabetes. Or even prevent it. You can do this by making lifestyle changes. These include losing extra weight if you are overweight. And getting more exercise. If you are overweight, losing 5% to 7% of your weight can help. Aim for at least 150 minutes a week of physical activity. Don't let more than 2 days go by without being active. Ask your healthcare provider for a referral to a lifestyle intervention program. This program will help you get to and stay at a 7% weight loss and increase physical activity.   Experts also advise all adults to spend less time sitting and being inactive. This is even more important if you have type 2 diabetes. If you do sit for a long time, get up for some light activity every 30 minutes.   How does diabetes affect blood sugar?   Your pancreas makes insulin. Insulin is needed for glucose to move into the body's cells for energy. Normally insulin is available for this.   When you have diabetes, your pancreas makes little or no insulin. Or your body's cells don't respond to the insulin that's made. This causes sugar to build up in the blood. But your body's cells need sugar. Without it, they don't have enough fuel to work  as they should.   The 3 main types of diabetes all lead to a buildup of blood sugar. This happens because of problems with insulin. But each type has a different cause and treatment:   Type 1 diabetes. Type 1 diabetes is an autoimmune disease. The body's immune system destroys the cells in the pancreas that make insulin. This means your body makes little or no insulin. People with type 1 diabetes must take insulin every day to live. About 1 in 20 people with diabetes have type 1.  Type 2 diabetes. Type 2 diabetes happens when the body can't make enough insulin. Or the body can't use it correctly. Type 2 may be controlled with diet, exercise, and weight loss. It can also be controlled with medicine taken by mouth. Or with insulin injections. About 9 in 10 to 19 in 20 people with diabetes have type 2.  Gestational diabetes. This type happens during pregnancy. It affects women who did not have diabetes before they got pregnant. They can't use the insulin their body makes. This type of diabetes often goes away after the baby is born. If it doesn't, it likely was not gestational diabetes. It was more likely type 1 or type 2 diabetes that began during pregnancy. Gestational diabetes may be controlled with diet and exercise, and by watching weight gain. Women with this type may need to take medicines to  control blood sugar. They may be at higher risk for type 2 later in life.  Complications of diabetes  Complications of diabetes include:   Eye problems and blindness  Heart disease  Stroke  Nervous system problems  Loss of a limb  Kidney disease  Impotence  Except for gestational diabetes, diabetes is an ongoing (chronic) disease that can't be cured. It affects nearly every part of the body. It can lead to other serious diseases. And it can be life-threatening. You must work with a healthcare provider to manage your diabetes. With the correct care, you can prevent the serious problems of the disease. Or stop them from  getting worse.   [employer] last reviewed this educational content on 07/07/2018   2000-2021 [employer]. All rights reserved. This information is not intended as a substitute for professional medical care. Always follow your healthcare professional's instructions.

## 2022-10-05 NOTE — Progress Notes (Signed)
[place] - [facility] [place] [facility]                       Date of Exam: 10/05/2022 2:54 PM        Patient ID: Claudia Adams is a 77 y.o. female.  Attending Physician: Fredderick Erb, DNP FNP        Chief Complaint:    Chief Complaint   Patient presents with    Weight Loss               HPI:    Pt presents to discuss ozempic vs trulicity vs mounjaro  - pt was on ozempic for 2.5-3 years, feels she has gained weight on trulicity and does not feel appetite suppresion  - DOP is on mounjaro, but unsure if it is right for pt  -                   Problem List:    Patient Active Problem List   Diagnosis    Rheumatoid arthritis    Diabetes mellitus    Benign hypertension    Hyperlipidemia    Hypothyroidism    Anxiety and depression    Primary localized osteoarthrosis of ankle and foot    Polyneuropathy due to type 2 diabetes mellitus    Chronic pain of left knee    Stage 3a chronic kidney disease    Positive QuantiFERON-TB Gold test    Other specified arthritis, right shoulder    Osteoarthritis of right shoulder             Current Meds:    Outpatient Medications Marked as Taking for the 10/05/22 encounter (Telemedicine Visit) with Fredderick Erb, DNP FNP   Medication Sig Dispense Refill    acetaminophen (TYLENOL) 325 MG tablet Take 1 tablet (325 mg) by mouth every 4 (four) hours      Diclofenac Sodium CR 100 MG 24 hr tablet Take by mouth daily      docusate sodium (COLACE) 100 MG capsule Take 1 capsule (100 mg) by mouth 3 (three) times daily      fexofenadine (ALLEGRA) 180 MG tablet Take 1 tablet (180 mg) by mouth every morning      gabapentin (NEURONTIN) 300 MG capsule Take 1 capsule (300 mg) by mouth 3 (three) times daily 270 capsule 1    glucose blood (Accu-Chek Guide) test strip Use as instructed to test blood sugars once daily diagnosis code E11.65 100 each 1    hydroxychloroquine (PLAQUENIL) 200 MG tablet Take 1.5 tablets (300 mg) by mouth every morning TAKES 1 1/2 TABS      inFLIXimab  (REMICADE IV) Infuse into the vein every 28 days LAST DOSE 8/213/23, NEXT DOSE AFTER SURGERY      levothyroxine (SYNTHROID) 25 MCG tablet Take 1 tablet (25 mcg) by mouth Once a day at 6:00am 100 tablet 1    losartan-hydrochlorothiazide (HYZAAR) 100-25 MG per tablet Take 1 tablet by mouth every morning 100 tablet 1    metoprolol succinate XL (TOPROL-XL) 50 MG 24 hr tablet Take 1 tablet (50 mg) by mouth every morning 100 tablet 1    Multiple Vitamins-Minerals (Womens 50+ Multi Vitamin/Min) Tab Take 1 tablet by mouth daily      potassium chloride (KLOR-CON M10) 10 MEQ CR tablet TAKE 1 TABLET(10 MEQ) BY MOUTH DAILY 100 tablet 1    rosuvastatin (CRESTOR) 20 MG tablet Take 1 tablet (20 mg) by mouth nightly 100 tablet 1  traMADol (ULTRAM) 50 MG tablet Take 1 tablet (50 mg) by mouth every 6 (six) hours as needed for Pain      traZODone (DESYREL) 50 MG tablet Take 1 tablet (50 mg) by mouth nightly 100 tablet 1    [DISCONTINUED] Dulaglutide (Trulicity) 3 QP/5.9FM Solution Pen-injector Inject 3 mg into the skin once a week 6 mL 1          Allergies:    Allergies   Allergen Reactions    Penicillin G      Reports allergic as a child               Past Surgical History:    Past Surgical History:   Procedure Laterality Date    ARTHROPLASTY SHOULDER REVERSE Right 07/01/2022    Procedure: ARTHROPLASTY, SHOULDER REVERSE;  Surgeon: Susa Raring, MD;  Location: [place] [facility];  Service: Orthopedics;  Laterality: Right;    EYE SURGERY      CATARCTS    FINGER SURGERY Right     INDEX    HYSTERECTOMY      KNEE SURGERY Right 11/30/2019    knee replacement by Dr. Amaryllis Dyke    right reverse shoulder arthroplasty  06/2022    ROTATOR CUFF REPAIR Left     SHOULDER ARTHROSCOPY Right 07/01/2022    Total reverse shoulder    SHOULDER SURGERY Right 2019    Rotator cuff    SHOULDER SURGERY Right     PUT BACK IN SOCKET           Family History:    Family History   Problem Relation Age of Onset    No known problems Mother     No known  problems Father            Social History:    Social History     Tobacco Use    Smoking status: Never    Smokeless tobacco: Never   Vaping Use    Vaping Use: Never used   Substance Use Topics    Alcohol use: Never    Drug use: Yes     Types: Marijuana     Comment: Gummie edibles for pain and anxiety-WILL NOT TAKE 7 DAYS BEFORE DOS           The following sections were reviewed this encounter by the provider:   Tobacco  Allergies  Meds  Problems  Med Hx  Surg Hx  Fam Hx             Vital Signs:    Ht 1.549 m (5\' 1" )   Wt 61.2 kg (135 lb)   BMI 25.51 kg/m          ROS:    Review of Systems           Physical Exam:    Physical Exam  Nursing note reviewed.   Constitutional:       Appearance: Normal appearance.   HENT:      Head: Normocephalic.   Pulmonary:      Effort: Pulmonary effort is normal.   Musculoskeletal:      Cervical back: Neck supple.   Neurological:      Mental Status: She is alert.   Psychiatric:         Mood and Affect: Mood normal.         Behavior: Behavior normal.              Assessment/Plan:    1. Type 2 diabetes  mellitus with other diabetic arthropathy, without long-term current use of insulin  - tirzepatide (Mounjaro) 5 MG/0.5ML injection; Inject 5 mg into the skin once a week  Dispense: 6 mL; Refill: 1    -A1c was never checked on Trulicity, she switched due to availability at the pharmacy  -Interested in [facility]. Equivalent dose given  -Stop Trulicity. Okay to do A1c in June at scheduled f/u              Follow-up:    No follow-ups on file.         Fredderick Erb, DNP FNP

## 2022-11-19 ENCOUNTER — Telehealth (INDEPENDENT_AMBULATORY_CARE_PROVIDER_SITE_OTHER): Payer: Medicare Other | Admitting: "Endocrinology

## 2022-11-25 ENCOUNTER — Other Ambulatory Visit (INDEPENDENT_AMBULATORY_CARE_PROVIDER_SITE_OTHER): Payer: Self-pay | Admitting: Family

## 2022-11-25 DIAGNOSIS — I10 Essential (primary) hypertension: Secondary | ICD-10-CM

## 2022-12-03 ENCOUNTER — Telehealth (INDEPENDENT_AMBULATORY_CARE_PROVIDER_SITE_OTHER): Payer: Medicare Other | Admitting: "Endocrinology

## 2023-01-20 ENCOUNTER — Encounter (INDEPENDENT_AMBULATORY_CARE_PROVIDER_SITE_OTHER): Payer: Self-pay | Admitting: Family

## 2023-02-14 ENCOUNTER — Ambulatory Visit (INDEPENDENT_AMBULATORY_CARE_PROVIDER_SITE_OTHER): Payer: Medicare Other | Admitting: Family

## 2023-02-14 ENCOUNTER — Other Ambulatory Visit (INDEPENDENT_AMBULATORY_CARE_PROVIDER_SITE_OTHER): Payer: Self-pay | Admitting: Family

## 2023-02-14 ENCOUNTER — Encounter (INDEPENDENT_AMBULATORY_CARE_PROVIDER_SITE_OTHER): Payer: Self-pay | Admitting: Family

## 2023-02-14 VITALS — BP 180/98 | HR 74 | Temp 98.1°F | Wt 140.0 lb

## 2023-02-14 DIAGNOSIS — E538 Deficiency of other specified B group vitamins: Secondary | ICD-10-CM

## 2023-02-14 DIAGNOSIS — N1831 Chronic kidney disease, stage 3a: Secondary | ICD-10-CM

## 2023-02-14 DIAGNOSIS — I10 Essential (primary) hypertension: Secondary | ICD-10-CM

## 2023-02-14 DIAGNOSIS — Z1159 Encounter for screening for other viral diseases: Secondary | ICD-10-CM

## 2023-02-14 DIAGNOSIS — F09 Unspecified mental disorder due to known physiological condition: Secondary | ICD-10-CM

## 2023-02-14 DIAGNOSIS — E1142 Type 2 diabetes mellitus with diabetic polyneuropathy: Secondary | ICD-10-CM

## 2023-02-14 DIAGNOSIS — E876 Hypokalemia: Secondary | ICD-10-CM

## 2023-02-14 DIAGNOSIS — E11618 Type 2 diabetes mellitus with other diabetic arthropathy: Secondary | ICD-10-CM

## 2023-02-14 DIAGNOSIS — E782 Mixed hyperlipidemia: Secondary | ICD-10-CM

## 2023-02-14 DIAGNOSIS — M069 Rheumatoid arthritis, unspecified: Secondary | ICD-10-CM

## 2023-02-14 DIAGNOSIS — G629 Polyneuropathy, unspecified: Secondary | ICD-10-CM

## 2023-02-14 DIAGNOSIS — G4700 Insomnia, unspecified: Secondary | ICD-10-CM

## 2023-02-14 LAB — ECG 12-LEAD
Atrial Rate: 69 {beats}/min
IHS MUSE NARRATIVE AND IMPRESSION: NORMAL
P Axis: 35 degrees
P-R Interval: 160 ms
Q-T Interval: 408 ms
QRS Duration: 96 ms
QTC Calculation (Bezet): 437 ms
R Axis: -18 degrees
T Axis: 23 degrees
Ventricular Rate: 69 {beats}/min

## 2023-02-14 LAB — POCT HEMOGLOBIN A1C: POCT Hgb A1C: 6.8 % — AB (ref 3.9–5.9)

## 2023-02-14 LAB — POCT MICROALBUMIN: Microalbumin, POC: 10

## 2023-02-14 MED ORDER — LOSARTAN POTASSIUM-HCTZ 100-25 MG PO TABS
1.0000 | ORAL_TABLET | Freq: Every morning | ORAL | 1 refills | Status: DC
Start: 2023-02-14 — End: 2023-09-05

## 2023-02-14 MED ORDER — TIRZEPATIDE 7.5 MG/0.5ML SC SOPN
7.5000 mg | PEN_INJECTOR | SUBCUTANEOUS | 0 refills | Status: DC
Start: 2023-02-14 — End: 2023-02-14

## 2023-02-14 MED ORDER — TIRZEPATIDE 7.5 MG/0.5ML SC SOPN
7.5000 mg | PEN_INJECTOR | SUBCUTANEOUS | 0 refills | Status: DC
Start: 2023-02-14 — End: 2023-05-17

## 2023-02-14 MED ORDER — ROSUVASTATIN CALCIUM 20 MG PO TABS
20.0000 mg | ORAL_TABLET | Freq: Every evening | ORAL | 1 refills | Status: DC
Start: 2023-02-14 — End: 2023-05-04

## 2023-02-14 MED ORDER — METOPROLOL SUCCINATE ER 50 MG PO TB24
50.0000 mg | ORAL_TABLET | Freq: Every morning | ORAL | 1 refills | Status: DC
Start: 2023-02-14 — End: 2023-09-05

## 2023-02-14 MED ORDER — GABAPENTIN 300 MG PO CAPS
300.0000 mg | ORAL_CAPSULE | Freq: Three times a day (TID) | ORAL | 1 refills | Status: DC
Start: 2023-02-14 — End: 2023-08-16

## 2023-02-14 MED ORDER — TRAZODONE HCL 50 MG PO TABS
50.0000 mg | ORAL_TABLET | Freq: Every evening | ORAL | 1 refills | Status: DC
Start: 2023-02-14 — End: 2023-06-10

## 2023-02-14 MED ORDER — AMLODIPINE BESYLATE 5 MG PO TABS
5.0000 mg | ORAL_TABLET | Freq: Every day | ORAL | 5 refills | Status: DC
Start: 2023-02-14 — End: 2023-08-12

## 2023-02-14 MED ORDER — POTASSIUM CHLORIDE CRYS ER 10 MEQ PO TBCR
EXTENDED_RELEASE_TABLET | ORAL | 1 refills | Status: DC
Start: 2023-02-14 — End: 2023-05-31

## 2023-02-14 NOTE — Telephone Encounter (Signed)
Pt agreed to have Mounjaro rest to [facility] since [facility] does not fill mounjaro any longer.

## 2023-02-14 NOTE — Patient Instructions (Signed)
Facts About Diabetes  What is diabetes?  When you have diabetes, your body doesn't make enough insulin. Or it can't use the insulin it makes. Insulin is a hormone. It helps sugar enter the cells to be used as energy. Without insulin, too much sugar collects in your blood.   There are 3 types of diabetes. They are type 1, type 2, and gestational diabetes.   What is prediabetes?  Prediabetes often happens before type 2 diabetes. Prediabetes is when blood sugar levels are higher than normal. But not as high as for diabetes. Many people with prediabetes will have type 2 diabetes within 10 years. More than 1 in 3 [place] adults have prediabetes. And most of them don't know the risks they face. Prediabetes also raises the risk for heart disease and stroke.   You can delay type 2 diabetes. Or even prevent it. You can do this by making lifestyle changes. These include losing extra weight if you are overweight. And getting more exercise. If you are overweight, losing 5% to 7% of your weight can help. Aim for at least 150 minutes a week of physical activity. Don't let more than 2 days go by without being active. Ask your healthcare provider for a referral to a lifestyle intervention program. This program will help you get to and stay at a 7% weight loss and increase physical activity.   Experts also advise all adults to spend less time sitting and being inactive. This is even more important if you have type 2 diabetes. If you do sit for a long time, get up for some light activity every 30 minutes.   How does diabetes affect blood sugar?   Your pancreas makes insulin. Insulin is needed for glucose to move into the body's cells for energy. Normally insulin is available for this.   When you have diabetes, your pancreas makes little or no insulin. Or your body's cells don’t respond to the insulin that’s made. This causes sugar to build up in the blood. But your body's cells need sugar. Without it, they don't have enough fuel to  work as they should.   The 3 main types of diabetes all lead to a buildup of blood sugar. This happens because of problems with insulin. But each type has a different cause and treatment:   · Type 1 diabetes. Type 1 diabetes is an autoimmune disease. The body's immune system destroys the cells in the pancreas that make insulin. This means your body makes little or no insulin. People with type 1 diabetes must take insulin every day to live. About 1 in 20 people with diabetes have type 1.  · Type 2 diabetes. Type 2 diabetes happens when the body can't make enough insulin. Or the body can't use it correctly. Type 2 may be controlled with diet, exercise, and weight loss. It can also be controlled with medicine taken by mouth. Or with insulin injections. About 9 in 10 to 19 in 20 people with diabetes have type 2.  · Gestational diabetes. This type happens during pregnancy. It affects women who did not have diabetes before they got pregnant. They can't use the insulin their body makes. This type of diabetes often goes away after the baby is born. If it doesn't, it likely was not gestational diabetes. It was more likely type 1 or type 2 diabetes that began during pregnancy. Gestational diabetes may be controlled with diet and exercise, and by watching weight gain. Women with this type may need to take medicines   to control blood sugar. They may be at higher risk for type 2 later in life.  Complications of diabetes  Complications of diabetes include:   · Eye problems and blindness  · Heart disease  · Stroke  · Nervous system problems  · Loss of a limb  · Kidney disease  · Impotence  Except for gestational diabetes, diabetes is an ongoing (chronic) disease that can't be cured. It affects nearly every part of the body. It can lead to other serious diseases. And it can be life-threatening. You must work with a healthcare provider to manage your diabetes. With the correct care, you can prevent the serious problems of the  disease. Or stop them from getting worse.   [employer] last reviewed this educational content on 07/07/2018  © 2000-2021 [employer]. All rights reserved. This information is not intended as a substitute for professional medical care. Always follow your healthcare professional's instructions.

## 2023-02-14 NOTE — Progress Notes (Signed)
[facility]                       Date of Virtual Visit: 02/14/2023 1:49 PM        Patient ID: Claudia Adams is a 77 y.o. female.  Attending Physician: Gaetano Hawthorne, DNP FNP       Telemedicine Eligibility:    State Location:  [x]  [place]  []  [place]  []  [place] []  [place]  []  Other:    Physical Location:  [x]  Home  []  Office  []  Other:    Patient Consent to Billing: Yes         Chief Complaint:    Chief Complaint   Patient presents with    Hyperlipidemia    Hypertension    Diabetes                 HPI:    Pt presents for Chronic care:    Hyperlipidemia: Rosuvastatin 20mg    Diet/exercise: walking dog 3 times a day   Medication compliance: as rx'd   Chest pain/pressure, shortness of breath? No   Refill needed: yes     Lab Results       Component                Value               Date                       CHOL                     145                 07/21/2022                 CHOL                     129                 02/15/2022                 TRIG                     73                  07/21/2022                 TRIG                     64                  02/15/2022                 HDL                      70                  07/21/2022                 HDL                      69                  02/15/2022  LDL                      60                  07/21/2022                 LDL                      47                  02/15/2022                 AST                      19                  07/21/2022                 AST                      23                  06/17/2022                 ALT                      11                  07/21/2022                 ALT                      12                  06/17/2022               Hypertension: Losartan-HCTZ 100-25mg  / Metoprolol 50mg    Average BPs at home: not recently   Medication compliance: daily as rx'd   Chest pain/pressure, shortness of breath? No   Exercise/Diet: walking 3 times a day    Last micro: due today  Refill needed:  yes     Lab Results       Component                Value               Date                       CREAT                    1.0                 07/21/2022                 CREAT                    1.1 (H)             07/02/2022                 EGFR                     58.3 (A)            07/21/2022  EGFR                     52.0 (A)            07/02/2022                Diabetes: Mounjaro 5mg    Last eye exam: 1 mo ago   Checking feet regularly?  Yes  Medication compliance: as rx'd   Blood sugars at home: 150s, 160s   Diet/exercise: Walking 3 times a day (dog walking)   Chest pain/pressure, blurry vision, numbness/tingling in feet: No   Last micro:  Last A1c: 07/21/22 -- 5.9  Flu shot: Aug 2023   Pneumonia vaccine: Prev 18 Jun 2014 / Prev 29 Jun 2015   Refill needed:  yes     Lab Results       Component                Value               Date                       HGBA1C                   5.9 (H)             07/21/2022                 HGBA1C                   6.2 (H)             02/15/2022                 URMICROALB               <5.0                07/21/2022                 URMICROALB               see below           07/21/2022                 GLU                      177 (H)             07/21/2022                 GLU                      158 (H)             07/02/2022                      Social History:    Social History     Tobacco Use    Smoking status: Never    Smokeless tobacco: Never   Vaping Use    Vaping status: Never Used   Substance Use Topics    Alcohol use: Never    Drug use: Yes     Types: Marijuana     Comment: Gummie edibles for pain and anxiety-WILL NOT TAKE 7 DAYS BEFORE DOS           The following sections were reviewed this encounter by the provider:  Tobacco  Allergies  Meds  Problems  Med Hx  Surg Hx  Fam Hx             Vital Signs:    BP (!) 180/98 Comment: 2nd check:  Pulse 74   Temp 98.1 F (36.7 C) (Temporal)   Wt 63.5 kg  (140 lb)   SpO2 98%   BMI 26.45 kg/m          ROS:    Review of Systems   Constitutional:  Positive for fatigue (secondary to treatment from RA). Negative for chills and unexpected weight change.   HENT:  Negative for congestion and dental problem.    Eyes:  Negative for visual disturbance.   Respiratory:  Negative for cough and shortness of breath.    Cardiovascular:  Negative for chest pain, palpitations and leg swelling.   Gastrointestinal:  Negative for abdominal pain, blood in stool, constipation and diarrhea.   Endocrine: Negative for cold intolerance and heat intolerance.   Genitourinary:  Negative for difficulty urinating and hematuria.   Musculoskeletal:  Positive for arthralgias and back pain. Negative for myalgias.   Skin:  Negative for rash (no acute skin changes).   Allergic/Immunologic: Positive for environmental allergies.   Neurological:  Negative for dizziness, tremors and headaches.   Hematological:  Does not bruise/bleed easily.   Psychiatric/Behavioral:  Negative for self-injury, sleep disturbance and suicidal ideas. The patient is not nervous/anxious.    All other systems reviewed and are negative.               Physical Exam:    Physical Exam  Vitals and nursing note reviewed.   Constitutional:       Appearance: Normal appearance.   HENT:      Head: Normocephalic.      Right Ear: Tympanic membrane normal.      Left Ear: Tympanic membrane normal.   Eyes:      General: Lids are normal.      Extraocular Movements: Extraocular movements intact.      Conjunctiva/sclera: Conjunctivae normal.   Neck:      Thyroid: No thyroid mass, thyromegaly or thyroid tenderness.   Cardiovascular:      Rate and Rhythm: Normal rate and regular rhythm.      Pulses: Normal pulses.           Dorsalis pedis pulses are 2+ on the right side and 2+ on the left side.      Heart sounds: Normal heart sounds. No murmur heard.  Pulmonary:      Effort: Pulmonary effort is normal.      Breath sounds: Normal breath sounds and  air entry. No stridor or decreased air movement.   Abdominal:      General: Bowel sounds are normal. There is no distension.      Palpations: Abdomen is soft.      Tenderness: There is no abdominal tenderness. There is no right CVA tenderness or left CVA tenderness.      Hernia: No hernia is present.   Musculoskeletal:      Cervical back: Normal range of motion and neck supple.      Right lower leg: No edema.      Left lower leg: No edema.      Right foot: Normal range of motion. Prominent metatarsal heads present.      Left foot: Normal range of motion. Prominent metatarsal heads present.   Feet:      Right foot:  Protective Sensation: 7 sites tested.  7 sites sensed.      Skin integrity: Skin integrity normal.      Left foot:      Protective Sensation: 7 sites tested.  5 sites sensed.      Skin integrity: Skin integrity normal.   Lymphadenopathy:      Cervical: No cervical adenopathy.      Right cervical: No superficial, deep or posterior cervical adenopathy.     Left cervical: No superficial, deep or posterior cervical adenopathy.      Upper Body:      Right upper body: No supraclavicular adenopathy.      Left upper body: No supraclavicular adenopathy.   Skin:     General: Skin is warm.   Neurological:      Mental Status: She is alert.      Cranial Nerves: No cranial nerve deficit or facial asymmetry.      Motor: Motor function is intact.      Coordination: Coordination is intact.      Deep Tendon Reflexes:      Reflex Scores:       Patellar reflexes are 2+ on the right side and 2+ on the left side.  Psychiatric:         Attention and Perception: Attention normal.         Mood and Affect: Mood normal.         Speech: Speech normal.         Behavior: Behavior normal.         Thought Content: Thought content normal.                  Assessment/Plan:    1. Mixed hyperlipidemia  - Lipid Panel; Future  - rosuvastatin (CRESTOR) 20 MG tablet; Take 1 tablet (20 mg) by mouth nightly  Dispense: 100 tablet; Refill:  1  -Continue current medication regimen and follow ups    2. Essential hypertension  - POCT microalbumin  - Comprehensive Metabolic Panel; Future  - losartan-hydrochlorothiazide (HYZAAR) 100-25 MG per tablet; Take 1 tablet by mouth every morning  Dispense: 100 tablet; Refill: 1  - amLODIPine (NORVASC) 5 MG tablet; Take 1 tablet (5 mg) by mouth daily  Dispense: 30 tablet; Refill: 5  - metoprolol succinate XL (TOPROL-XL) 50 MG 24 hr tablet; Take 1 tablet (50 mg) by mouth every morning  Dispense: 100 tablet; Refill: 1  - tirzepatide (MOUNJARO) 7.5 MG/0.5ML injection; Inject 7.5 mg into the skin once a week  Dispense: 6 mL; Refill: 0  -HTN is uncontrolled  -Pts BP at her infusion was around 150/60 (pt does not recall exact number)  -Overall, she feels fine. She will monitor her BP at home  -Pt has yet to make her cardiology apt, and she will do this soon  -Will add amlodipine at this time. Discussed that it may make her neuropathy worse.   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 Rheumatoid arthritis involving multiple sites, unspecified whether rheumatoid factor present  - First diagnosed about 16 years ago   - Followed now by rheumatology in our area (Dr. Donette Larry)  - Currently on Remicade infusions every 4 weeks  - Also uses diclofenac  PRN, hydroxychloroquine daily, methotrexate (six tablets) weekly   -Has chronic fatigue from this     5. Stage 3a chronic kidney disease  Secondary to diabetes. Last GFR 53, has been stable so was d/c from nephrology. No longer seeing nephrology     6. Cognitive disorder  -[facility] yet [facility]     7. Uncontrolled hypertension  - ECG 12 lead    8. Encounter for hepatitis C screening test for low risk patient  - Hepatitis C Antibody,  Total; Future    9. Folic acid deficiency (non anemic)  - Folate; Future  -Not currently on supplements, will restart if indicated    10. Hypokalemia  - potassium chloride (KLOR-CON M10) 10 MEQ CR tablet; TAKE 1 TABLET(10 MEQ) BY MOUTH DAILY  Dispense: 100 tablet; Refill: 1    11. Insomnia, unspecified type  - traZODone (DESYREL) 50 MG tablet; Take 1 tablet (50 mg) by mouth nightly  Dispense: 100 tablet; Refill: 1    12. Neuropathy  - gabapentin (NEURONTIN) 300 MG capsule; Take 1 capsule (300 mg) by mouth 3 (three) times daily  Dispense: 270 capsule; Refill: 1  -Has neuropathy in feet for which she takes gabapentin for.  -Secondary to diabetes                Follow-up:    Return in about 6 months (around 08/16/2023) for Wellness Exam.         Gaetano Hawthorne, DNP FNP

## 2023-02-15 ENCOUNTER — Other Ambulatory Visit (INDEPENDENT_AMBULATORY_CARE_PROVIDER_SITE_OTHER): Payer: Self-pay | Admitting: Family

## 2023-02-15 DIAGNOSIS — E039 Hypothyroidism, unspecified: Secondary | ICD-10-CM

## 2023-02-15 LAB — LIPID PANEL
Cholesterol / HDL Ratio: 1.9 Index
Cholesterol: 131 mg/dL (ref <=199)
HDL: 68 mg/dL (ref >=40)
LDL Calculated: 51 mg/dL (ref 0–129)
Triglycerides: 62 mg/dL (ref 34–149)
VLDL Calculated: 12 mg/dL (ref 10–40)

## 2023-02-15 LAB — COMPREHENSIVE METABOLIC PANEL
ALT: 17 U/L (ref 0–55)
AST (SGOT): 24 U/L (ref 5–41)
Albumin/Globulin Ratio: 1.5 (ref 0.9–2.2)
Albumin: 4.1 g/dL (ref 3.5–5.0)
Alkaline Phosphatase: 67 U/L (ref 37–117)
Anion Gap: 8 (ref 5.0–15.0)
BUN: 18 mg/dL (ref 7–21)
Bilirubin, Total: 0.6 mg/dL (ref 0.2–1.2)
CO2: 29 mEq/L (ref 17–29)
Calcium: 9.9 mg/dL (ref 7.9–10.2)
Chloride: 105 mEq/L (ref 99–111)
Creatinine: 1.2 mg/dL — ABNORMAL HIGH (ref 0.4–1.0)
GFR: 46.4 mL/min/{1.73_m2} — ABNORMAL LOW (ref >=60.0)
Globulin: 2.7 g/dL (ref 2.0–3.6)
Glucose: 178 mg/dL — ABNORMAL HIGH (ref 70–100)
Hemolysis Index: 7 Index
Potassium: 3.7 mEq/L (ref 3.5–5.3)
Protein, Total: 6.8 g/dL (ref 6.0–8.3)
Sodium: 142 mEq/L (ref 135–145)

## 2023-02-15 LAB — FOLATE
Folate: 32.3 ng/mL
Hemolysis Index: 5 Index

## 2023-02-15 LAB — LAB USE ONLY - GOLD SST HOLD TUBE

## 2023-02-15 LAB — HEPATITIS C ANTIBODY, TOTAL: Hepatitis C Antibody: NONREACTIVE

## 2023-02-15 NOTE — Progress Notes (Signed)
I forgot to order a thyroid level- can you please add on TSH      Overall your labs look stable, your kidney function is still declined so aim to decrease any anti-inflammatories and extra supplements.  Continue to stay well-hydrated.  You do not need to add folic acid supplementation since that level is still norma

## 2023-02-16 ENCOUNTER — Other Ambulatory Visit (FREE_STANDING_LABORATORY_FACILITY): Payer: Medicare Other

## 2023-02-16 DIAGNOSIS — E039 Hypothyroidism, unspecified: Secondary | ICD-10-CM

## 2023-02-17 ENCOUNTER — Other Ambulatory Visit (INDEPENDENT_AMBULATORY_CARE_PROVIDER_SITE_OTHER): Payer: Self-pay | Admitting: Family

## 2023-02-17 DIAGNOSIS — E039 Hypothyroidism, unspecified: Secondary | ICD-10-CM

## 2023-02-17 LAB — THYROID STIMULATING HORMONE (TSH) WITH REFLEX TO FREE T4: TSH: 1.25 u[IU]/mL (ref 0.35–4.94)

## 2023-02-17 MED ORDER — LEVOTHYROXINE SODIUM 25 MCG PO TABS
25.0000 ug | ORAL_TABLET | Freq: Every day | ORAL | 1 refills | Status: DC
Start: 2023-02-17 — End: 2023-07-19

## 2023-02-17 NOTE — Progress Notes (Signed)
The thyroid level is normal. I have sent a refill to your pharmacy on file, and would like you to follow up in 6 months.

## 2023-03-07 ENCOUNTER — Ambulatory Visit (INDEPENDENT_AMBULATORY_CARE_PROVIDER_SITE_OTHER): Payer: Medicare Other | Admitting: Family

## 2023-03-09 NOTE — Progress Notes (Signed)
[facility] [place] [facility]                       Date of Exam: 03/17/2023 2:13 PM        Patient ID: Claudia Adams is a 77 y.o. female.  Attending Physician: Gaetano Hawthorne, DNP FNP        Chief Complaint:    Chief Complaint   Patient presents with    Hypertension                 HPI:    Pt presents for HTN f/u:     Hypertension: Amlodipine 5mg  / Losartan-HCTZ 100-25mg    Average BPs at home: low 120s, low 130s, 140s / 60s, mid  70s   Medication compliance: as rx'd   Chest pain/pressure, shortness of breath? No   Last micro: June 2024,  A:C<30  Refill needed: No    Lab Results       Component                Value               Date                       CREAT                    1.2 (H)             02/14/2023                 CREAT                    1.0                 07/21/2022                 EGFR                     46.4 (L)            02/14/2023                 EGFR                     58.3 (A)            07/21/2022                          Vital Signs:    BP 126/72   Pulse 78   Temp 97.9 F (36.6 C) (Temporal)   Wt 62.6 kg (138 lb)   SpO2 95%   BMI 26.07 kg/m          ROS:    Review of Systems   Constitutional:  Negative for fatigue and fever.   Respiratory:  Negative for shortness of breath.    Cardiovascular:  Negative for chest pain and palpitations.                Physical Exam:    Physical Exam  Vitals and nursing note reviewed.   Constitutional:       General: She is not in acute distress.     Appearance: Normal appearance.   HENT:      Head: Normocephalic.   Cardiovascular:      Rate and Rhythm: Normal rate and regular rhythm.  Heart sounds: Normal heart sounds. No murmur heard.  Pulmonary:      Effort: Pulmonary effort is normal. No respiratory distress.      Breath sounds: Normal breath sounds. No decreased breath sounds or wheezing.   Musculoskeletal:      Cervical back: Neck supple.   Neurological:      Mental Status: She is alert and oriented to  person, place, and time.   Psychiatric:         Mood and Affect: Mood normal.         Behavior: Behavior normal.                Assessment/Plan:    1. Essential hypertension  -well controlled today  -Will continue amlodipine  -Has yet to make apt with Cardiologist, this is on her to do list    2. Type 2 diabetes mellitus with other diabetic arthropathy, without long-term current use of insulin  -Continue current medication regimen and follow ups    3. Cognitive disorder  -Has yet to make neurologist apt    4. Seasonal allergies  -Trial xyzal and flonase          Follow-up:    Return in about 3 months (around 06/17/2023) for Med Refill.         Gaetano Hawthorne, DNP FNP

## 2023-03-17 ENCOUNTER — Ambulatory Visit (INDEPENDENT_AMBULATORY_CARE_PROVIDER_SITE_OTHER): Payer: Medicare Other | Admitting: Family

## 2023-03-17 ENCOUNTER — Encounter (INDEPENDENT_AMBULATORY_CARE_PROVIDER_SITE_OTHER): Payer: Self-pay | Admitting: Family

## 2023-03-17 VITALS — BP 126/72 | HR 78 | Temp 97.9°F | Wt 138.0 lb

## 2023-03-17 DIAGNOSIS — F09 Unspecified mental disorder due to known physiological condition: Secondary | ICD-10-CM

## 2023-03-17 DIAGNOSIS — E11618 Type 2 diabetes mellitus with other diabetic arthropathy: Secondary | ICD-10-CM

## 2023-03-17 DIAGNOSIS — J302 Other seasonal allergic rhinitis: Secondary | ICD-10-CM

## 2023-03-17 DIAGNOSIS — I10 Essential (primary) hypertension: Secondary | ICD-10-CM

## 2023-03-17 NOTE — Patient Instructions (Signed)
Low-Salt Diet  This diet removes foods that are high in salt. It also limits the amount of salt you use when cooking. It is most often used for people with high blood pressure, edema (fluid retention), and kidney, liver, or heart disease.  Table salt contains the mineral sodium. Your body needs sodium to work normally. But too much sodium can make your health problems worse. Your healthcare provider is recommending a low-salt (also called low-sodium) diet for you. Your total daily allowance of salt is 1,500 to 2,300 milligrams (mg). It is less than 1 teaspoon of table salt. This means you can have only about 500 to 700 mg of sodium at each meal. People with certain health problems should limit salt intake to the lower end of the recommended range.    When you cook, don't add much salt. If you can cook without using salt, even better. Don't add salt to your food at the table.  When shopping, read food labels. Salt is often called sodium on the label. Choose foods that are salt-free, low salt, or very low salt. Note that foods with reduced salt may not lower your salt intake enough.    Beans, potatoes, and pasta  Ok: Dry beans, split peas, lentils, potatoes, rice, macaroni, pasta, spaghetti without added salt  Avoid: Potato chips, tortilla chips, and similar products  Breads and cereals  Ok: Low-sodium breads, rolls, cereals, and cakes; low-salt crackers, matzo crackers  Avoid: Salted crackers, pretzels, popcorn, French toast, pancakes, muffins  Dairy  Ok: Milk, chocolate milk, hot chocolate mix, low-salt cheeses, and yogurt  Avoid: Processed cheese and cheese spreads; Roquefort, Camembert, and cottage cheese; buttermilk, instant breakfast drink  Desserts  Ok: Ice cream, frozen yogurt, juice bars, gelatin, cookies and pies, sugar, honey, jelly, hard candy  Avoid: Most pies, cakes and cookies prepared or processed with salt; instant pudding  Drinks  Ok: Tea, coffee, fizzy (carbonated) drinks, juices  Avoid: Flavored  coffees, electrolyte replacement drinks, sports drinks  Meats  Ok: All fresh meat, fish, poultry, low-salt tuna, eggs, egg substitute  Avoid: Smoked, pickled, brine-cured, or salted meats and fish. This includes bacon, chipped beef, corned beef, hot dogs, deli meats, ham, kosher meats, salt pork, sausage, canned tuna, salted codfish, smoked salmon, herring, sardines, or anchovies.  Seasonings and spices  Ok: Most seasonings are okay. Good substitutes for salt include: fresh herb blends, hot sauce, lemon, garlic, curry, vinegar, dry mustard, parsley, cilantro, horseradish, tomato paste, regular margarine, mayonnaise, unsalted butter, cream cheese, vegetable oil, cream, low-salt salad dressing and gravy.  Avoid: Regular ketchup, relishes, pickles, soy sauce, teriyaki sauce, Worcestershire sauce, BBQ sauce, tartar sauce, meat tenderizer, chili sauce, regular gravy, regular salad dressing, salted butter  Soups  Ok: Low-salt soups and broths made with allowed foods  Avoid: Bouillon cubes, soups with smoked or salted meats, regular soup and broth  Vegetables  Ok: Most vegetables are okay; also low-salt tomato and vegetable juices  Avoid: Sauerkraut and other brine-soaked vegetables; pickles and other pickled vegetables; tomato juice, olives  [employer] last reviewed this educational content on 04/07/2015   2000-2020 [employer] [address], [place] [address]. All rights reserved. This information is not intended as a substitute for professional medical care. Always follow your healthcare professional's instructions.

## 2023-03-30 ENCOUNTER — Ambulatory Visit (INDEPENDENT_AMBULATORY_CARE_PROVIDER_SITE_OTHER): Payer: Medicare Other | Admitting: Cardiology

## 2023-03-30 ENCOUNTER — Encounter (INDEPENDENT_AMBULATORY_CARE_PROVIDER_SITE_OTHER): Payer: Self-pay | Admitting: Cardiology

## 2023-03-30 VITALS — BP 152/76 | HR 56 | Ht 61.0 in | Wt 137.0 lb

## 2023-03-30 DIAGNOSIS — E119 Type 2 diabetes mellitus without complications: Secondary | ICD-10-CM

## 2023-03-30 DIAGNOSIS — I10 Essential (primary) hypertension: Secondary | ICD-10-CM

## 2023-03-30 DIAGNOSIS — E782 Mixed hyperlipidemia: Secondary | ICD-10-CM

## 2023-03-30 NOTE — Progress Notes (Signed)
[facility] C[facility] OFFICE CONSULTATION NOTE    HRT [facility] OFFICE -CARDIOLOGY  [address]  [place] [address]  Dept: [phone]  Dept Fax: [phone]         Patient Name: Claudia Adams, Claudia Adams  Date of Visit:  March 30, 2023  Date of Birth: 04/24/1946  AGE: 77 y.o.  Medical Record #: [id number]  Gaetano Hawthorne, DNP FNP is requesting a cardiology consultation for establishing cardiovascular care and hypertension    History of Present Illness:   [name] is a 77 y.o. female with history as outlined below. History obtained from patient.     Patient comes to establish care.  She previously lived in the area but has been moving around.  Her husband passed away a few years back.  She is to see a cardiologist in [place] kept up with sounds like annual stress test and lower extremity duplex.  She does not have any history of angina, coronary artery disease or peripheral arterial disease.  She has been seeing a primary care doctor here and she had a spike in blood pressure and thus was recommended to see cardiology.  Overall though her blood pressure is largely in the 120s over 60s including yesterday at the infusion clinic -where she goes for rheumatoid arthritis.  Blood pressure at home is also in the 120s over 60s.  She walks her dog for 20 minutes 3-4 times a day without any chest discomfort, shortness of breath.  She also denies orthopnea, paroxysmal nocturnal dyspnea or lower extremity edema.    Past Medical and Surgical History   She has a past medical history of Anemia, Anxiety, Diabetes mellitus, Disorder of thyroid, Ear, nose and throat disorder, Hyperlipidemia, Hypertension, Hypothyroidism, Neuropathy, Other specified arthritis, right shoulder, Pulmonary hypertension, Renal insufficiency, Rheumatoid arthritis, Seasonal allergic rhinitis, and Type 2 diabetes mellitus, controlled. She has a past surgical history that includes Shoulder surgery  (Right, 2019); Knee surgery (Right, 11/30/2019); Hysterectomy; Finger surgery (Right); Eye surgery; Rotator cuff repair (Left); Shoulder surgery (Right); right reverse shoulder arthroplasty (06/2022); Shoulder arthroscopy (Right, 07/01/2022); and ARTHROPLASTY SHOULDER REVERSE (Right, 07/01/2022).    Allergies   Allergies[1]    Medications     Current Outpatient Medications   Medication Instructions    acetaminophen (TYLENOL) 325 mg, Oral, Every 4 hours    amLODIPine (NORVASC) 5 mg, Oral, Daily    Diclofenac Sodium CR 100 MG 24 hr tablet Oral, Daily    docusate sodium (COLACE) 100 mg, Oral, 3 times daily    fexofenadine (ALLEGRA) 180 mg, Oral, Every morning    folic acid (FOLVITE) 1 MG tablet TAKE 1 TABLET BY MOUTH EVERY DAY    gabapentin (NEURONTIN) 300 mg, Oral, 3 times daily    glucose blood (Accu-Chek Guide) test strip Use as instructed to test blood sugars once daily diagnosis code E11.65    hydroxychloroquine (PLAQUENIL) 200 mg, Oral, Every morning    inFLIXimab (REMICADE IV) Intravenous, Every 28 days, LAST DOSE 8/213/23, NEXT DOSE AFTER SURGERY    levothyroxine (SYNTHROID) 25 mcg, Oral, Daily at 0600    losartan-hydrochlorothiazide (HYZAAR) 100-25 MG per tablet 1 tablet, Oral, Every morning    metoprolol succinate XL (TOPROL-XL) 50 mg, Oral, Every morning    Multiple Vitamins-Minerals (Womens 50+ Multi Vitamin/Min) Tab 1 tablet, Oral, Daily    potassium chloride (KLOR-CON M10) 10 MEQ CR tablet TAKE 1 TABLET(10 MEQ) BY MOUTH DAILY    rosuvastatin (CRESTOR) 20 mg, Oral, At bedtime  tirzepatide [facility]) 7.5 mg, Subcutaneous, weekly    traMADol (ULTRAM) 50 mg, Oral, Every 6 hours PRN    traZODone (DESYREL) 50 mg, Oral, At bedtime       Family History   no Family History of Sudden Cardiac Death  no Family History of Premature CAD    Social History   She reports that she has never smoked. She has never used smokeless tobacco. She reports current drug use. Drug: Marijuana. She reports that she does not drink  alcohol.    Physical Examination     Vitals:    03/30/23 1309   BP: 152/76   BP Site: Right arm   Patient Position: Sitting   Cuff Size: Large   Pulse: (!) 56   Weight: 62.1 kg (137 lb)   Height: 1.549 m (5\' 1" )     General/Constitutional: Not distress. Well appearing.   Neck: JVP 5 cm 90 degrees.  Normal neck circumference  Cardiac: Regular rate and rhythm.  Normal S1 and S2.  No murmurs  Pulmonary: Normal effort. + Lung Sounds. no wheezing. No rales.   GI: not distended. + bowel sounds  Extremities: Warm.  Trace pedal edema  Neuro: Normal gait. AOx3.  Psych: Not depressed. not anxious.     Diagnostics   Pertinent Labs Reviewed and Included Below    ECG (Personally Reviewed and Interpreted)  02/14/2023: Normal sinus, LVE    Diagnostics personally interpreted with description below:   N/A    Assessment and Plan   Pertinent Patient Problem List  Hypertension  Diabetes with hemoglobin A1c 6.8 as of June 2024  Hyperlipidemia on statin therapy with total cholesterol 131, HDL 68, LDL 51 and triglycerides 62 as of June 2024  Rheumatoid arthritis  Stage IIIa chronic kidney disease with creatinine 1.2 as of June 2024  Elevated BMI on Tirzepatide (also for Diabetes)  Hypothyroidism    Impression  Her blood pressure largely seems under control.  The reading is high today and she was rushing into the clinic trying to find our location.  I would keep the regimen where it sat right now.  Will obtain an echocardiogram to characterize her LV function before considering any changes.  Her cholesterol is well-controlled based on recent labs as well.    Patient will follow up in 12 months or sooner if earlier medical attention is needed.     Signed:    Alda Ponder MD [facility]  [facility]    Orders     Orders Placed This Encounter   Procedures    Echo Transthoracic Adult Complete    Office Visit (HRT [place])     No orders of the defined types were placed in this encounter.    This note was generated by the Dragon speech recognition and may  contain errors or omissions not intended by the user. Grammatical errors, random word insertions, deletions, pronoun errors, and incomplete sentences are occasional consequences of this technology due to software limitations. Not all errors are caught or corrected. If there are questions or concerns about the content of this note or information contained within the body of this dictation, they should be addressed directly with the author for clarification.         [1]   Allergies  Allergen Reactions    Penicillin G      Reports allergic as a child

## 2023-04-07 ENCOUNTER — Ambulatory Visit
Admission: RE | Admit: 2023-04-07 | Discharge: 2023-04-07 | Disposition: A | Discharge status: Home / Self Care | Payer: Medicare Other | Source: Ambulatory Visit | Attending: Cardiology | Admitting: Cardiology

## 2023-04-07 DIAGNOSIS — I10 Essential (primary) hypertension: Secondary | ICD-10-CM

## 2023-04-07 DIAGNOSIS — I253 Aneurysm of heart: Secondary | ICD-10-CM | POA: Insufficient documentation

## 2023-04-08 NOTE — Plan of Care (Signed)
Limited echo with bubble study ordered to evaluate atrial septal aneurysm.  Echocardiogram results discussed with the patient who is in agreement

## 2023-05-04 ENCOUNTER — Other Ambulatory Visit (INDEPENDENT_AMBULATORY_CARE_PROVIDER_SITE_OTHER): Payer: Self-pay | Admitting: Family

## 2023-05-04 ENCOUNTER — Other Ambulatory Visit (INDEPENDENT_AMBULATORY_CARE_PROVIDER_SITE_OTHER): Payer: Self-pay | Admitting: "Endocrinology

## 2023-05-04 DIAGNOSIS — E782 Mixed hyperlipidemia: Secondary | ICD-10-CM

## 2023-05-04 DIAGNOSIS — I10 Essential (primary) hypertension: Secondary | ICD-10-CM

## 2023-05-05 ENCOUNTER — Other Ambulatory Visit (INDEPENDENT_AMBULATORY_CARE_PROVIDER_SITE_OTHER): Payer: Self-pay | Admitting: Family

## 2023-05-05 DIAGNOSIS — E1142 Type 2 diabetes mellitus with diabetic polyneuropathy: Secondary | ICD-10-CM

## 2023-05-10 ENCOUNTER — Other Ambulatory Visit (INDEPENDENT_AMBULATORY_CARE_PROVIDER_SITE_OTHER): Payer: Self-pay | Admitting: Family

## 2023-05-10 DIAGNOSIS — I10 Essential (primary) hypertension: Secondary | ICD-10-CM

## 2023-05-17 ENCOUNTER — Ambulatory Visit (INDEPENDENT_AMBULATORY_CARE_PROVIDER_SITE_OTHER): Payer: Medicare Other | Admitting: Family

## 2023-05-17 ENCOUNTER — Encounter (INDEPENDENT_AMBULATORY_CARE_PROVIDER_SITE_OTHER): Payer: Self-pay | Admitting: Family

## 2023-05-17 VITALS — BP 102/82 | HR 63 | Temp 98.2°F | Wt 138.0 lb

## 2023-05-17 DIAGNOSIS — I253 Aneurysm of heart: Secondary | ICD-10-CM | POA: Insufficient documentation

## 2023-05-17 DIAGNOSIS — F09 Unspecified mental disorder due to known physiological condition: Secondary | ICD-10-CM | POA: Insufficient documentation

## 2023-05-17 DIAGNOSIS — I10 Essential (primary) hypertension: Secondary | ICD-10-CM

## 2023-05-17 DIAGNOSIS — E1165 Type 2 diabetes mellitus with hyperglycemia: Secondary | ICD-10-CM

## 2023-05-17 DIAGNOSIS — Z23 Encounter for immunization: Secondary | ICD-10-CM

## 2023-05-17 DIAGNOSIS — N1831 Chronic kidney disease, stage 3a: Secondary | ICD-10-CM

## 2023-05-17 LAB — POCT HEMOGLOBIN A1C: POCT Hgb A1C: 6 % — AB (ref 3.9–5.9)

## 2023-05-17 MED ORDER — TIRZEPATIDE 7.5 MG/0.5ML SC SOPN
7.5000 mg | PEN_INJECTOR | SUBCUTANEOUS | 0 refills | Status: DC
Start: 2023-05-17 — End: 2023-05-24

## 2023-05-17 NOTE — Progress Notes (Signed)
[facility] FAMILY PRACTICE - AN [place] [facility]                       Date of Exam: 05/17/2023 1:35 PM        Patient ID: Claudia Adams is a 77 y.o. female.  Attending Physician: Gaetano Hawthorne, DNP FNP        Chief Complaint:    Chief Complaint   Patient presents with    Hypertension    Diabetes                 HPI:    Pt presents for chronic care:     Hypertension  Average BPs at home: on occasion; 120s / high 60s, 70s   Medication compliance: daily as rx'd   Chest pain/pressure, shortness of breath? No   Exercise/Diet: yes   Last micro:  June 2024, A:C <30  Refill needed: Yes    Lab Results       Component                Value               Date                       CREAT                    1.2 (H)             02/14/2023                 CREAT                    1.0                 07/21/2022                 EGFR                     46.4 (L)            02/14/2023                 EGFR                     58.3 (A)            07/21/2022                  Diabetes:  Last eye exam: Summer 2024   Checking feet regularly? Yes   Medication compliance: daily as rx'd   Blood sugars at home: 150s   Diet/exercise: walking 4x a day   Chest pain/pressure, blurry vision, numbness/tingling in feet:  no   Last micro:  June 2024, A:C <30  Last A1c: 02/14/23 -- 6.8  Flu shot: due today -- high dose   Pneumonia vaccine: Prev 18 Jun 2014, Prev 29 Jun 2015   Refill needed: Yes    Lab Results       Component                Value               Date                       HGBA1C  6.8 (A)             02/14/2023                 HGBA1C                   5.9 (H)             07/21/2022                 URMICROALB               <5.0                07/21/2022                 URMICROALB               see below           07/21/2022                 GLU                      178 (H)             02/14/2023                 GLU                      177 (H)             07/21/2022                          Vital Signs:     BP 102/82   Pulse 63   Temp 98.2 F (36.8 C) (Temporal)   Wt 62.6 kg (138 lb)   SpO2 95%   BMI 26.07 kg/m          ROS:    Review of Systems   Constitutional:  Positive for fatigue (secondary to treatment from RA). Negative for chills and unexpected weight change.   HENT:  Negative for congestion and dental problem.    Eyes:  Negative for visual disturbance.   Respiratory:  Negative for cough and shortness of breath.    Cardiovascular:  Negative for chest pain, palpitations and leg swelling.   Gastrointestinal:  Negative for abdominal pain, blood in stool, constipation and diarrhea.   Endocrine: Negative for cold intolerance and heat intolerance.   Genitourinary:  Negative for difficulty urinating and hematuria.   Musculoskeletal:  Positive for arthralgias and back pain. Negative for myalgias.   Skin:  Negative for rash (no acute skin changes).   Allergic/Immunologic: Positive for environmental allergies.   Neurological:  Negative for dizziness, tremors and headaches.   Hematological:  Does not bruise/bleed easily.   Psychiatric/Behavioral:  Negative for self-injury, sleep disturbance and suicidal ideas. The patient is not nervous/anxious.    All other systems reviewed and are negative.               Physical Exam:    Physical Exam  Vitals and nursing note reviewed.   Constitutional:       Appearance: Normal appearance.   HENT:      Head: Normocephalic.      Right Ear: Tympanic membrane normal.      Left Ear: Tympanic membrane normal.   Eyes:      General: Lids are normal.      Extraocular Movements:  Extraocular movements intact.      Conjunctiva/sclera: Conjunctivae normal.   Neck:      Thyroid: No thyroid mass, thyromegaly or thyroid tenderness.   Cardiovascular:      Rate and Rhythm: Normal rate and regular rhythm.      Pulses: Normal pulses.           Dorsalis pedis pulses are 2+ on the right side and 2+ on the left side.      Heart sounds: Normal heart sounds. No murmur heard.  Pulmonary:      Effort:  Pulmonary effort is normal.      Breath sounds: Normal breath sounds and air entry. No stridor or decreased air movement.   Abdominal:      General: Bowel sounds are normal. There is no distension.      Palpations: Abdomen is soft.      Tenderness: There is no abdominal tenderness. There is no right CVA tenderness or left CVA tenderness.      Hernia: No hernia is present.   Musculoskeletal:      Cervical back: Normal range of motion and neck supple.      Right lower leg: No edema.      Left lower leg: No edema.      Right foot: Normal range of motion. Prominent metatarsal heads present.      Left foot: Normal range of motion. Prominent metatarsal heads present.        Feet:    Feet:      Right foot:      Protective Sensation: 7 sites tested.  7 sites sensed.      Skin integrity: Skin integrity normal.      Left foot:      Protective Sensation: 7 sites tested.  4 sites sensed.      Skin integrity: Skin integrity normal.   Lymphadenopathy:      Cervical: No cervical adenopathy.      Right cervical: No superficial, deep or posterior cervical adenopathy.     Left cervical: No superficial, deep or posterior cervical adenopathy.      Upper Body:      Right upper body: No supraclavicular adenopathy.      Left upper body: No supraclavicular adenopathy.   Skin:     General: Skin is warm.   Neurological:      Mental Status: She is alert.      Cranial Nerves: No cranial nerve deficit or facial asymmetry.      Motor: Motor function is intact.      Coordination: Coordination is intact.      Deep Tendon Reflexes:      Reflex Scores:       Patellar reflexes are 2+ on the right side and 2+ on the left side.  Psychiatric:         Attention and Perception: Attention normal.         Mood and Affect: Mood normal.         Speech: Speech normal.         Behavior: Behavior normal.         Thought Content: Thought content normal.           Assessment & Plan  Essential hypertension  -HTN is controlled. No side effects or dizziness  -Will  continue current medication regimen  -Continue lifestyle measures         Stage 3a chronic kidney disease  Secondary to diabetes. Last GFR 40s, has been  stable so was d/c from nephrology. No longer seeing nephrology          Type 2 diabetes mellitus with hyperglycemia, without long-term current use of insulin  -No longer sees endo, previously managed by Carolann Littler, last visit in 2022  -A1c 6.0%  -Continue taking mounjaro  -Continue to monitor neuropathy and kidney function as complications from T2DM  -eye exam: UTD, Summer 2024  -foot exam done 05/17/23  -blood pressure at goal of <140/90  -flu vaccine: UTD  -pneumonia vaccine: UTD  -exercise minimum: 150 minutes / week;  at goal  -Treatment: Continue current medication regimen   -Long-term micro and macro vascular complications discussed including blindness, kidney failure, heart attack, stroke and death from prolonged uncontrolled DM  -F/U 6 months      Orders:    POCT Hemoglobin A1C    tirzepatide (MOUNJARO) 7.5 MG/0.5ML injection; Inject 7.5 mg into the skin once a week    Cognitive disorder  -[place]ocha 14/30 2023  -Referred to Neuro, pt has yet to make apt, she states she will make this apt after she moves to [place] in October 2024  -Will consider care coordination of apt not made by December       Atrial septal aneurysm  -Seen on Echo 04/07/2023. TTE ordered for further eval:  Will see Cardiology yearly       Flu vaccine need    Orders:    Flu vaccine TRIVALENT, 65 yrs and older (FLUZONE HIGH-DOSE) single-dose PF, 0.5 mL        Follow-up:    Return in about 3 months (around 08/16/2023) for medicare wellness.         Gaetano Hawthorne, DNP FNP

## 2023-05-17 NOTE — Assessment & Plan Note (Signed)
-  No longer seeing Dr. Leonie Douglas, can see her PRN  -Will send in refill once labs result

## 2023-05-17 NOTE — Assessment & Plan Note (Signed)
-  Seen on Echo 04/07/2023. TTE ordered for further eval:  Will see Cardiology yearly

## 2023-05-17 NOTE — Assessment & Plan Note (Addendum)
Secondary to diabetes. Last GFR 40s, has been stable so was d/c from nephrology. No longer seeing nephrology

## 2023-05-17 NOTE — Assessment & Plan Note (Signed)
-  Mocha 14/30 2023  -Referred to Neuro, pt has yet to make apt, she states she will make this apt after she moves to [facility] in October 2024  -Will consider care coordination of apt not made by December

## 2023-05-17 NOTE — Assessment & Plan Note (Addendum)
-  No longer sees endo, previously managed by Carolann Littler, last visit in 2022  -A1c 6.0%  -Continue taking mounjaro  -Continue to monitor neuropathy and kidney function as complications from T2DM  -eye exam: UTD, Summer 2024  -foot exam done 05/17/23  -blood pressure at goal of <140/90  -flu vaccine: UTD  -pneumonia vaccine: UTD  -exercise minimum: 150 minutes / week;  at goal  -Treatment: Continue current medication regimen   -Long-term micro and macro vascular complications discussed including blindness, kidney failure, heart attack, stroke and death from prolonged uncontrolled DM  -F/U 6 months      Orders:    POCT Hemoglobin A1C    tirzepatide (MOUNJARO) 7.5 MG/0.5ML injection; Inject 7.5 mg into the skin once a week

## 2023-05-17 NOTE — Assessment & Plan Note (Addendum)
-  HTN is controlled. No side effects or dizziness  -Will continue current medication regimen  -Continue lifestyle measures

## 2023-05-17 NOTE — Patient Instructions (Signed)
Facts About Diabetes  What is diabetes?  When you have diabetes, your body doesn't make enough insulin. Or it can't use the insulin it makes. Insulin is a hormone. It helps sugar enter the cells to be used as energy. Without insulin, too much sugar collects in your blood.   There are 3 types of diabetes. They are type 1, type 2, and gestational diabetes.   What is prediabetes?  Prediabetes often happens before type 2 diabetes. Prediabetes is when blood sugar levels are higher than normal. But not as high as for diabetes. Many people with prediabetes will have type 2 diabetes within 10 years. More than 1 in 3 [place] adults have prediabetes. And most of them don't know the risks they face. Prediabetes also raises the risk for heart disease and stroke.   You can delay type 2 diabetes. Or even prevent it. You can do this by making lifestyle changes. These include losing extra weight if you are overweight. And getting more exercise. If you are overweight, losing 5% to 7% of your weight can help. Aim for at least 150 minutes a week of physical activity. Don't let more than 2 days go by without being active. Ask your healthcare provider for a referral to a lifestyle intervention program. This program will help you get to and stay at a 7% weight loss and increase physical activity.   Experts also advise all adults to spend less time sitting and being inactive. This is even more important if you have type 2 diabetes. If you do sit for a long time, get up for some light activity every 30 minutes.   How does diabetes affect blood sugar?   Your pancreas makes insulin. Insulin is needed for glucose to move into the body's cells for energy. Normally insulin is available for this.   When you have diabetes, your pancreas makes little or no insulin. Or your body's cells don’t respond to the insulin that’s made. This causes sugar to build up in the blood. But your body's cells need sugar. Without it, they don't have enough fuel to  work as they should.   The 3 main types of diabetes all lead to a buildup of blood sugar. This happens because of problems with insulin. But each type has a different cause and treatment:   · Type 1 diabetes. Type 1 diabetes is an autoimmune disease. The body's immune system destroys the cells in the pancreas that make insulin. This means your body makes little or no insulin. People with type 1 diabetes must take insulin every day to live. About 1 in 20 people with diabetes have type 1.  · Type 2 diabetes. Type 2 diabetes happens when the body can't make enough insulin. Or the body can't use it correctly. Type 2 may be controlled with diet, exercise, and weight loss. It can also be controlled with medicine taken by mouth. Or with insulin injections. About 9 in 10 to 19 in 20 people with diabetes have type 2.  · Gestational diabetes. This type happens during pregnancy. It affects women who did not have diabetes before they got pregnant. They can't use the insulin their body makes. This type of diabetes often goes away after the baby is born. If it doesn't, it likely was not gestational diabetes. It was more likely type 1 or type 2 diabetes that began during pregnancy. Gestational diabetes may be controlled with diet and exercise, and by watching weight gain. Women with this type may need to take medicines   to control blood sugar. They may be at higher risk for type 2 later in life.  Complications of diabetes  Complications of diabetes include:   · Eye problems and blindness  · Heart disease  · Stroke  · Nervous system problems  · Loss of a limb  · Kidney disease  · Impotence  Except for gestational diabetes, diabetes is an ongoing (chronic) disease that can't be cured. It affects nearly every part of the body. It can lead to other serious diseases. And it can be life-threatening. You must work with a healthcare provider to manage your diabetes. With the correct care, you can prevent the serious problems of the  disease. Or stop them from getting worse.   [employer] last reviewed this educational content on 07/07/2018  © 2000-2021 [employer]. All rights reserved. This information is not intended as a substitute for professional medical care. Always follow your healthcare professional's instructions.

## 2023-05-19 ENCOUNTER — Other Ambulatory Visit (INDEPENDENT_AMBULATORY_CARE_PROVIDER_SITE_OTHER): Payer: Self-pay | Admitting: Family

## 2023-05-23 ENCOUNTER — Telehealth (INDEPENDENT_AMBULATORY_CARE_PROVIDER_SITE_OTHER): Payer: Self-pay | Admitting: Family

## 2023-05-23 ENCOUNTER — Encounter (INDEPENDENT_AMBULATORY_CARE_PROVIDER_SITE_OTHER): Payer: Self-pay | Admitting: Family

## 2023-05-23 NOTE — Telephone Encounter (Signed)
PA initiated by   [x]  CMM    []   Fax    []  Patient  []  Other    KEY CODE: Key: [id number]    Name and dose of medication:  Mounjaro 7.5MG /0.5ML pen-injectors    Prescribed by: R. Sparks DNP     Patient's current Rx insurance:   [x]   UTD in chart    Verify MyChart Access:   [x]     PA process:  PA has been submitted and pending response        PA f/u: Please f/u in CMM    DT, CMA

## 2023-05-23 NOTE — Telephone Encounter (Signed)
PA initiated by   [x]  CMM    []   Fax    []  Patient  []  Other    KEY CODE: Key: [id number]    Name and dose of medication: Mounjaro 7.5MG /0.5ML pen-injectorsf    Prescribed by: R. Judithann Sheen, DNP     Patient's current Rx insurance:   [x]   UTD in chart    Verify MyChart Access:   [x]     PA process:  PA has been submitted and approved        PA f/u: PA process complete       DT, CMA

## 2023-05-24 ENCOUNTER — Encounter (INDEPENDENT_AMBULATORY_CARE_PROVIDER_SITE_OTHER): Payer: Self-pay | Admitting: Family

## 2023-05-24 ENCOUNTER — Other Ambulatory Visit (INDEPENDENT_AMBULATORY_CARE_PROVIDER_SITE_OTHER): Payer: Self-pay | Admitting: Family

## 2023-05-24 ENCOUNTER — Telehealth (INDEPENDENT_AMBULATORY_CARE_PROVIDER_SITE_OTHER): Payer: Self-pay | Admitting: Family

## 2023-05-24 DIAGNOSIS — E1165 Type 2 diabetes mellitus with hyperglycemia: Secondary | ICD-10-CM

## 2023-05-24 MED ORDER — TIRZEPATIDE 7.5 MG/0.5ML SC SOPN
7.5000 mg | PEN_INJECTOR | SUBCUTANEOUS | 0 refills | Status: DC
Start: 2023-05-24 — End: 2023-12-05

## 2023-05-24 NOTE — Telephone Encounter (Signed)
Pt called regarding RX tirzepatide [facility]) 7.5 MG/0.5ML injection , [facility] [place], due to RX being out of stock at [facility]        Thank you

## 2023-05-24 NOTE — Telephone Encounter (Signed)
T/C to pt advisng med has be resent to preferred pharm for med   Pt expressed an understanding     DT,CMA

## 2023-05-31 ENCOUNTER — Other Ambulatory Visit (INDEPENDENT_AMBULATORY_CARE_PROVIDER_SITE_OTHER): Payer: Self-pay | Admitting: Family

## 2023-05-31 DIAGNOSIS — E876 Hypokalemia: Secondary | ICD-10-CM

## 2023-06-10 ENCOUNTER — Other Ambulatory Visit (INDEPENDENT_AMBULATORY_CARE_PROVIDER_SITE_OTHER): Payer: Self-pay | Admitting: Family

## 2023-06-10 DIAGNOSIS — G4700 Insomnia, unspecified: Secondary | ICD-10-CM

## 2023-07-08 ENCOUNTER — Other Ambulatory Visit (INDEPENDENT_AMBULATORY_CARE_PROVIDER_SITE_OTHER): Payer: Self-pay | Admitting: "Endocrinology

## 2023-07-08 DIAGNOSIS — Z794 Long term (current) use of insulin: Secondary | ICD-10-CM

## 2023-07-19 ENCOUNTER — Other Ambulatory Visit (INDEPENDENT_AMBULATORY_CARE_PROVIDER_SITE_OTHER): Payer: Self-pay | Admitting: Family

## 2023-07-19 DIAGNOSIS — E039 Hypothyroidism, unspecified: Secondary | ICD-10-CM

## 2023-07-19 MED ORDER — LEVOTHYROXINE SODIUM 25 MCG PO TABS
25.0000 ug | ORAL_TABLET | Freq: Every day | ORAL | 0 refills | Status: DC
Start: 2023-07-19 — End: 2023-09-06

## 2023-07-19 NOTE — Telephone Encounter (Signed)
Pt called requesting refill due refill has expired. Thanks,cap

## 2023-08-02 ENCOUNTER — Other Ambulatory Visit (INDEPENDENT_AMBULATORY_CARE_PROVIDER_SITE_OTHER): Payer: Self-pay | Admitting: Family

## 2023-08-02 DIAGNOSIS — G4700 Insomnia, unspecified: Secondary | ICD-10-CM

## 2023-08-02 MED ORDER — TRAZODONE HCL 50 MG PO TABS
50.0000 mg | ORAL_TABLET | Freq: Every evening | ORAL | 0 refills | Status: DC
Start: 2023-08-02 — End: 2023-09-05

## 2023-08-02 NOTE — Telephone Encounter (Signed)
Patient called in requesting refill.  Thanks!  KH

## 2023-08-03 ENCOUNTER — Other Ambulatory Visit (INDEPENDENT_AMBULATORY_CARE_PROVIDER_SITE_OTHER): Payer: Self-pay | Admitting: Family

## 2023-08-03 DIAGNOSIS — I10 Essential (primary) hypertension: Secondary | ICD-10-CM

## 2023-08-05 ENCOUNTER — Other Ambulatory Visit (INDEPENDENT_AMBULATORY_CARE_PROVIDER_SITE_OTHER): Payer: Self-pay | Admitting: "Endocrinology

## 2023-08-05 DIAGNOSIS — E782 Mixed hyperlipidemia: Secondary | ICD-10-CM

## 2023-08-05 NOTE — Telephone Encounter (Signed)
Last OV: 07/12/22  Next OV: not scheduled

## 2023-08-08 ENCOUNTER — Telehealth (INDEPENDENT_AMBULATORY_CARE_PROVIDER_SITE_OTHER): Payer: Self-pay | Admitting: "Endocrinology

## 2023-08-08 NOTE — Telephone Encounter (Signed)
Patient last seen > 1 year ago, recommend f/u visit for continuity of care and to allow for future refills.  If seeing other provider, recommend transferring rx to that provider. Thanks

## 2023-08-08 NOTE — Telephone Encounter (Signed)
Called pt to schedule f/u. Pt said that she is managing her Diabetes by her PCP; that she does not need to see a specialist no more

## 2023-08-10 ENCOUNTER — Other Ambulatory Visit (INDEPENDENT_AMBULATORY_CARE_PROVIDER_SITE_OTHER): Payer: Self-pay | Admitting: "Endocrinology

## 2023-08-10 DIAGNOSIS — E782 Mixed hyperlipidemia: Secondary | ICD-10-CM

## 2023-08-11 ENCOUNTER — Other Ambulatory Visit (INDEPENDENT_AMBULATORY_CARE_PROVIDER_SITE_OTHER): Payer: Self-pay | Admitting: Family

## 2023-08-11 DIAGNOSIS — I10 Essential (primary) hypertension: Secondary | ICD-10-CM

## 2023-08-12 ENCOUNTER — Other Ambulatory Visit (INDEPENDENT_AMBULATORY_CARE_PROVIDER_SITE_OTHER): Payer: Self-pay | Admitting: Family

## 2023-08-12 DIAGNOSIS — I10 Essential (primary) hypertension: Secondary | ICD-10-CM

## 2023-08-12 MED ORDER — AMLODIPINE BESYLATE 5 MG PO TABS
5.0000 mg | ORAL_TABLET | Freq: Every day | ORAL | 0 refills | Status: DC
Start: 2023-08-12 — End: 2023-09-05

## 2023-08-12 NOTE — Telephone Encounter (Signed)
Patient called in to check the status of her refill for her bp med and doesn't understand why it was denied since she needs a temp refill to get her through her appointment on 12/30.   Patient took last tablet today .  Thanks!  KH

## 2023-08-15 ENCOUNTER — Ambulatory Visit (INDEPENDENT_AMBULATORY_CARE_PROVIDER_SITE_OTHER): Payer: Medicare Other | Admitting: Family

## 2023-08-16 ENCOUNTER — Other Ambulatory Visit (INDEPENDENT_AMBULATORY_CARE_PROVIDER_SITE_OTHER): Payer: Self-pay | Admitting: Family

## 2023-08-16 DIAGNOSIS — G629 Polyneuropathy, unspecified: Secondary | ICD-10-CM

## 2023-08-16 MED ORDER — GABAPENTIN 300 MG PO CAPS
300.0000 mg | ORAL_CAPSULE | Freq: Three times a day (TID) | ORAL | 0 refills | Status: DC
Start: 2023-08-16 — End: 2023-09-05

## 2023-08-16 NOTE — Telephone Encounter (Signed)
Patient called in requesting refill. She is now in [place] and needs it sent to this location.  Thanks!  KH

## 2023-08-18 NOTE — Telephone Encounter (Signed)
LVM for pharm     DT ,CMA

## 2023-08-18 NOTE — Telephone Encounter (Signed)
Pt called back this afternoon to advise that she was not able to p/u her Gabapentin medication from [facility] pharmacy. Pt isn't sure why they did not dispense out the medication but has been out of her meds for the past 3 days and is very frustrated because of this. Pt would like to know when she may go ahead and p/u.    Please advise, ty - NR.

## 2023-08-19 NOTE — Telephone Encounter (Signed)
Pt called regarding the medication. I contacted the pharmacy and they stated it can be picked up today. Called pt to inform.    KC

## 2023-08-25 ENCOUNTER — Other Ambulatory Visit (INDEPENDENT_AMBULATORY_CARE_PROVIDER_SITE_OTHER): Payer: Self-pay | Admitting: Family

## 2023-08-25 DIAGNOSIS — E876 Hypokalemia: Secondary | ICD-10-CM

## 2023-08-25 MED ORDER — POTASSIUM CHLORIDE CRYS ER 10 MEQ PO TBCR
EXTENDED_RELEASE_TABLET | ORAL | 0 refills | Status: DC
Start: 2023-08-25 — End: 2023-09-05

## 2023-08-25 NOTE — Telephone Encounter (Signed)
 Refill request fx'd fr [facility]

## 2023-09-01 NOTE — Progress Notes (Signed)
 [facility] FAMILY PRACTICE - AN [place] PARTNER                       Date of Exam: 09/05/2023 11:32 AM        Patient ID: Claudia Adams is a 77 y.o. female.  Attending Physician: Gaetano Hawthorne, DNP FNP        Chief Complaint:    [name]

## 2023-09-02 ENCOUNTER — Encounter (INDEPENDENT_AMBULATORY_CARE_PROVIDER_SITE_OTHER): Payer: Self-pay | Admitting: Family

## 2023-09-05 ENCOUNTER — Encounter (INDEPENDENT_AMBULATORY_CARE_PROVIDER_SITE_OTHER): Payer: Self-pay | Admitting: Family

## 2023-09-05 ENCOUNTER — Ambulatory Visit (INDEPENDENT_AMBULATORY_CARE_PROVIDER_SITE_OTHER): Payer: Medicare Other | Admitting: Family

## 2023-09-05 VITALS — BP 110/70 | HR 81 | Temp 98.2°F | Ht 61.0 in | Wt 136.0 lb

## 2023-09-05 DIAGNOSIS — G629 Polyneuropathy, unspecified: Secondary | ICD-10-CM

## 2023-09-05 DIAGNOSIS — E782 Mixed hyperlipidemia: Secondary | ICD-10-CM

## 2023-09-05 DIAGNOSIS — E876 Hypokalemia: Secondary | ICD-10-CM | POA: Insufficient documentation

## 2023-09-05 DIAGNOSIS — I10 Essential (primary) hypertension: Secondary | ICD-10-CM

## 2023-09-05 DIAGNOSIS — Z0000 Encounter for general adult medical examination without abnormal findings: Secondary | ICD-10-CM

## 2023-09-05 DIAGNOSIS — E039 Hypothyroidism, unspecified: Secondary | ICD-10-CM

## 2023-09-05 DIAGNOSIS — F09 Unspecified mental disorder due to known physiological condition: Secondary | ICD-10-CM

## 2023-09-05 DIAGNOSIS — G4700 Insomnia, unspecified: Secondary | ICD-10-CM | POA: Insufficient documentation

## 2023-09-05 DIAGNOSIS — E11618 Type 2 diabetes mellitus with other diabetic arthropathy: Secondary | ICD-10-CM

## 2023-09-05 DIAGNOSIS — E1142 Type 2 diabetes mellitus with diabetic polyneuropathy: Secondary | ICD-10-CM

## 2023-09-05 LAB — COMPREHENSIVE METABOLIC PANEL
ALT: 19 U/L (ref <=55)
AST (SGOT): 28 U/L (ref <=41)
Albumin/Globulin Ratio: 1.6 (ref 0.9–2.2)
Albumin: 4.1 g/dL (ref 3.5–5.0)
Alkaline Phosphatase: 60 U/L (ref 37–117)
Anion Gap: 8 (ref 5.0–15.0)
BUN: 17 mg/dL (ref 7–21)
Bilirubin, Total: 0.9 mg/dL (ref 0.2–1.2)
CO2: 31 meq/L — ABNORMAL HIGH (ref 17–29)
Calcium: 9.5 mg/dL (ref 7.9–10.2)
Chloride: 105 meq/L (ref 99–111)
Creatinine: 1.2 mg/dL — ABNORMAL HIGH (ref 0.4–1.0)
GFR: 46.1 mL/min/{1.73_m2} — ABNORMAL LOW (ref >=60.0)
Globulin: 2.6 g/dL (ref 2.0–3.6)
Glucose: 168 mg/dL — ABNORMAL HIGH (ref 70–100)
Hemolysis Index: 7 {index}
Potassium: 4.2 meq/L (ref 3.5–5.3)
Protein, Total: 6.7 g/dL (ref 6.0–8.3)
Sodium: 144 meq/L (ref 135–145)

## 2023-09-05 LAB — LIPID PANEL
Cholesterol / HDL Ratio: 1.8 {index}
Cholesterol: 126 mg/dL (ref <=199)
HDL: 70 mg/dL (ref >=40)
LDL Calculated: 45 mg/dL (ref 0–99)
Triglycerides: 56 mg/dL (ref 34–149)
VLDL Calculated: 11 mg/dL (ref 10–40)

## 2023-09-05 LAB — HEMOGLOBIN A1C
Average Estimated Glucose: 128.4 mg/dL
Hemoglobin A1C: 6.1 % — ABNORMAL HIGH (ref 4.6–5.6)

## 2023-09-05 LAB — THYROID STIMULATING HORMONE (TSH) WITH REFLEX TO FREE T4: TSH: 1.61 u[IU]/mL (ref 0.35–4.94)

## 2023-09-05 MED ORDER — METOPROLOL SUCCINATE ER 50 MG PO TB24
50.0000 mg | ORAL_TABLET | Freq: Every morning | ORAL | 1 refills | Status: DC
Start: 2023-09-05 — End: 2024-03-27

## 2023-09-05 MED ORDER — AMLODIPINE BESYLATE 5 MG PO TABS
5.0000 mg | ORAL_TABLET | Freq: Every day | ORAL | 1 refills | Status: DC
Start: 2023-09-05 — End: 2023-12-05

## 2023-09-05 MED ORDER — LOSARTAN POTASSIUM-HCTZ 100-25 MG PO TABS
1.0000 | ORAL_TABLET | Freq: Every morning | ORAL | 1 refills | Status: DC
Start: 2023-09-05 — End: 2024-03-27

## 2023-09-05 MED ORDER — POTASSIUM CHLORIDE CRYS ER 10 MEQ PO TBCR
EXTENDED_RELEASE_TABLET | ORAL | 1 refills | Status: DC
Start: 2023-09-05 — End: 2024-03-13

## 2023-09-05 MED ORDER — TRAZODONE HCL 50 MG PO TABS
50.0000 mg | ORAL_TABLET | Freq: Every evening | ORAL | 1 refills | Status: DC
Start: 2023-09-05 — End: 2024-03-13

## 2023-09-05 MED ORDER — ROSUVASTATIN CALCIUM 20 MG PO TABS
20.0000 mg | ORAL_TABLET | Freq: Every day | ORAL | 1 refills | Status: DC
Start: 2023-09-05 — End: 2024-03-27

## 2023-09-05 MED ORDER — GABAPENTIN 300 MG PO CAPS
300.0000 mg | ORAL_CAPSULE | Freq: Three times a day (TID) | ORAL | 0 refills | Status: DC
Start: 2023-09-05 — End: 2023-12-05

## 2023-09-05 NOTE — Assessment & Plan Note (Signed)
-  moca testing was 14/30 in 2023. Was in a lot of pain. Has scored better on clock/3 word recall in 2024 so okay to just monitor closely, pt does not want neuro eval

## 2023-09-05 NOTE — Assessment & Plan Note (Signed)
 Orders:    traZODone (DESYREL) 50 MG tablet; Take 1 tablet (50 mg) by mouth nightly

## 2023-09-05 NOTE — Assessment & Plan Note (Signed)
 Has neuropathy in feet for which she takes gabapentin for.  Secondary to diabetes

## 2023-09-05 NOTE — Assessment & Plan Note (Signed)
-  Will send in refill once labs result  Orders:    Thyroid Stimulating Hormone (TSH) with Reflex to Free T4; Future    Thyroid Stimulating Hormone (TSH) with Reflex to Free T4

## 2023-09-05 NOTE — Patient Instructions (Signed)
 MEDICARE WELLNESS PERSONAL PREVENTION PLAN   As part of the Medicare Wellness portion of your visit today, we are providing you with this personalized preventative plan of care. We have listed below some of the preventative services that are recommended fo

## 2023-09-05 NOTE — Progress Notes (Signed)
 [facility] FAMILY PRACTICE - AN [place] PARTNER    TAELA CHARBONNEAU is a 77 y.o. female who presents today for the following Medicare Wellness Visit:  []  Initial Preventive Physical Exam (IPPE) - "Welcome to Medicare" preventive visit (Vision

## 2023-09-05 NOTE — Assessment & Plan Note (Signed)
 Orders:    potassium chloride (KLOR-CON M10) 10 MEQ CR tablet; TAKE 1 TABLET(10 MEQ) BY MOUTH DAILY

## 2023-09-05 NOTE — Assessment & Plan Note (Addendum)
-  Pt takes rosuvastatin  -No s/e from meds  -Goal LDL <100, <70 ideal    Orders:    Lipid Panel; Future    rosuvastatin (CRESTOR) 20 MG tablet; Take 1 tablet (20 mg) by mouth daily    Lipid Panel

## 2023-09-05 NOTE — Assessment & Plan Note (Addendum)
-  HTN is controlled. No side effects or dizziness  -Will continue current medication regimen  -Continue lifestyle measures  - Had a nuclear stress test and carotid doppler in the past which were normal  -  established care with Cardiologist, Dr. Celine Mans 7/24/

## 2023-09-05 NOTE — Assessment & Plan Note (Addendum)
-  No longer sees endo, previously managed by Carolann Littler, last visit in 2022  -A1c 6.0%  -Continue taking mounjaro  -Continue to monitor neuropathy and kidney function as complications from T2DM  -eye exam: UTD, Summer 2024  -foot exam done 05/17/23  -blood p

## 2023-09-06 ENCOUNTER — Other Ambulatory Visit (INDEPENDENT_AMBULATORY_CARE_PROVIDER_SITE_OTHER): Payer: Self-pay | Admitting: Family

## 2023-09-06 DIAGNOSIS — E039 Hypothyroidism, unspecified: Secondary | ICD-10-CM

## 2023-09-06 MED ORDER — LEVOTHYROXINE SODIUM 25 MCG PO TABS
25.0000 ug | ORAL_TABLET | Freq: Every day | ORAL | 1 refills | Status: DC
Start: 2023-09-06 — End: 2024-03-27

## 2023-09-06 NOTE — Progress Notes (Signed)
 The thyroid level is normal. I have sent a refill to your pharmacy on file, and would like you to follow up in 6 months.     Your cholesterol levels are normal. You can continue the current dose of medication.     Your kidney function is still decreased bu

## 2023-09-14 ENCOUNTER — Other Ambulatory Visit (INDEPENDENT_AMBULATORY_CARE_PROVIDER_SITE_OTHER): Payer: Self-pay | Admitting: "Endocrinology

## 2023-09-14 DIAGNOSIS — E782 Mixed hyperlipidemia: Secondary | ICD-10-CM

## 2023-09-19 ENCOUNTER — Telehealth (INDEPENDENT_AMBULATORY_CARE_PROVIDER_SITE_OTHER): Payer: Self-pay | Admitting: Family

## 2023-09-19 ENCOUNTER — Other Ambulatory Visit (INDEPENDENT_AMBULATORY_CARE_PROVIDER_SITE_OTHER): Payer: Self-pay | Admitting: Family

## 2023-09-19 DIAGNOSIS — Z1283 Encounter for screening for malignant neoplasm of skin: Secondary | ICD-10-CM

## 2023-09-19 NOTE — Telephone Encounter (Signed)
 Pt called stating that we were suppose to send her a referral order for dermatologist. Can you please provide the order and the list of dermatology specialist and send pt message to the mychart. Thanks,cap

## 2023-10-23 ENCOUNTER — Other Ambulatory Visit (INDEPENDENT_AMBULATORY_CARE_PROVIDER_SITE_OTHER): Payer: Self-pay | Admitting: "Endocrinology

## 2023-10-23 DIAGNOSIS — E1142 Type 2 diabetes mellitus with diabetic polyneuropathy: Secondary | ICD-10-CM

## 2023-10-24 NOTE — Telephone Encounter (Signed)
 LV 07/12/22  UV none     Patient has transferred care to PCP please see TE from 08/08/23.

## 2023-11-29 ENCOUNTER — Telehealth (INDEPENDENT_AMBULATORY_CARE_PROVIDER_SITE_OTHER): Payer: Medicare Other | Admitting: Family

## 2023-12-05 ENCOUNTER — Other Ambulatory Visit (INDEPENDENT_AMBULATORY_CARE_PROVIDER_SITE_OTHER): Payer: Self-pay | Admitting: Family

## 2023-12-05 ENCOUNTER — Telehealth (INDEPENDENT_AMBULATORY_CARE_PROVIDER_SITE_OTHER): Admitting: Family

## 2023-12-05 ENCOUNTER — Encounter (INDEPENDENT_AMBULATORY_CARE_PROVIDER_SITE_OTHER): Payer: Self-pay | Admitting: Family

## 2023-12-05 VITALS — Wt 132.0 lb

## 2023-12-05 DIAGNOSIS — E11618 Type 2 diabetes mellitus with other diabetic arthropathy: Secondary | ICD-10-CM

## 2023-12-05 DIAGNOSIS — E1165 Type 2 diabetes mellitus with hyperglycemia: Secondary | ICD-10-CM

## 2023-12-05 DIAGNOSIS — G629 Polyneuropathy, unspecified: Secondary | ICD-10-CM

## 2023-12-05 DIAGNOSIS — M069 Rheumatoid arthritis, unspecified: Secondary | ICD-10-CM

## 2023-12-05 DIAGNOSIS — N1831 Chronic kidney disease, stage 3a: Secondary | ICD-10-CM

## 2023-12-05 DIAGNOSIS — M7989 Other specified soft tissue disorders: Secondary | ICD-10-CM

## 2023-12-05 MED ORDER — GABAPENTIN 300 MG PO CAPS
300.0000 mg | ORAL_CAPSULE | Freq: Three times a day (TID) | ORAL | 0 refills | Status: DC
Start: 2023-12-05 — End: 2024-03-27

## 2023-12-05 MED ORDER — TIRZEPATIDE 7.5 MG/0.5ML SC SOAJ
7.5000 mg | SUBCUTANEOUS | 0 refills | Status: AC
Start: 2023-12-05 — End: ?

## 2023-12-05 NOTE — Assessment & Plan Note (Signed)
-   First diagnosed about 16 years ago   - Followed now by rheumatology in our area (Dr. Ransom)  - Currently on Remicade infusions every 4 weeks  - Also uses diclofenac PRN, hydroxychloroquine  daily, methotrexate (six tablets) weekly   -Has chronic fatigue from this

## 2023-12-05 NOTE — Progress Notes (Signed)
 [facility] [place] [facility]       Telehealth:  The Patient has given verbal consent for delivery of health care via telehealth.   The patient is located at Home in [place]   The encounter provider is located at their Medical Office in [place]   Epic Video Client was utilized for Real Time/Synchronous Telehealth.   The time spent in medical discussion during this visit was 12 minutes.        Subjective     History of Present Illness  The patient, with a history of diabetes and associated neuropathy, presents with bilateral leg swelling, more pronounced on the right. She describes a sensation of tightness and reports that the neuropathy seems to be ascending up the leg. The patient denies any recent changes in diet, specifically salt intake, and denies any associated shortness of breath, chest pain, or calf pain.    The patient also reports hair loss, which was evaluated by a dermatologist. The dermatologist attributed the hair loss to the patient's medications and aging, and recommended the use of interglow vitamins and a topical solution. The patient has started using rice water products for her hair, which seems to have slowed the hair loss.    The patient is also requesting a prescription refill for her Magerro medication, as she was only given a one month supply. She also requests a refill for her gabapentin  medication for neuropathy. The patient has an upcoming appointment with her cardiologist in August for an echocardiogram.    Review of Systems   Constitutional:  Negative for fever.   Respiratory:  Negative for shortness of breath.    Cardiovascular:  Negative for chest pain and palpitations.   Skin:  Negative for rash.       Objective   Wt 59.9 kg (132 lb)   BMI 24.94 kg/m   Physical Exam  Nursing note reviewed.   Constitutional:       Appearance: Normal appearance.   HENT:      Head: Normocephalic.   Pulmonary:      Effort: Pulmonary effort is normal.   Musculoskeletal:       Cervical back: Neck supple.   Neurological:      Mental Status: She is alert.   Psychiatric:         Mood and Affect: Mood normal.         Behavior: Behavior normal.         Results        Assessment/Plan     Assessment & Plan  Leg swelling  -Bilateral leg swelling, more significant in the right leg. Currently on maximum dose of hydrochlorothiazide.   -Low suspicion for thrombosis, but ultrasound ordered to rule out vascular issues. Cardiologist follow-up scheduled in August for echocardiogram. -Consideration of stronger diuretic like furosemide pending further evaluation.  - Order bilateral leg ultrasound  - Advise use of compression stockings and leg elevation  - Consider advancing cardiologist appointment for echocardiogram  Orders:    US  Venous Low Extrem Duplx Dopp Comp Bilat; Future    Neuropathy  Chronic diabetic neuropathy with symptoms of tightness and swelling in both legs, more pronounced in the right leg. No associated dyspnea, chest pain, or calf pain. Managed with gabapentin .  Orders:    gabapentin  (NEURONTIN ) 300 MG capsule; Take 1 capsule (300 mg) by mouth 3 (three) times daily    Rheumatoid arthritis involving multiple sites, unspecified whether rheumatoid factor present (CMS/HCC)  - First diagnosed about 18  years ago   - Followed now by rheumatology in our area (Dr. Ransom)  - Currently on Remicade infusions every 4 weeks  - Also uses diclofenac PRN, hydroxychloroquine  daily, methotrexate (six tablets) weekly   -Has chronic fatigue from this          Type 2 diabetes mellitus with other diabetic arthropathy, without long-term current use of insulin  (CMS/HCC)         Stage 3a chronic kidney disease (CMS/HCC)  Secondary to diabetes. Last GFR 53, has been stable so was d/c from nephrology. No longer seeing nephrology        Type 2 diabetes mellitus with hyperglycemia, without long-term current use of insulin  (CMS/HCC)  -A1c 6.0% will repeat in 3 months  -Goal A1c <7%, less than 6.5 % ideal  -Continue  taking mounjaro   -Continue to monitor neuropathy and kidney function as complications from T2DM  -eye exam: UTD, Summer 2024  -foot exam done 05/17/23  -blood pressure at goal of <140/90  -flu vaccine: UTD  -pneumonia vaccine: UTD  -exercise minimum: 150 minutes / week;  at goal  -Treatment: Continue current medication regimen   -Long-term micro and macro vascular complications discussed including blindness, kidney failure, heart attack, stroke and death from prolonged uncontrolled DM  -F/U 3 months     Orders:    tirzepatide  (MOUNJARO ) 7.5 MG/0.5ML injection; Inject 7.5 mg into the skin once a week        Verbal consent obtained to record this visit.

## 2023-12-05 NOTE — Assessment & Plan Note (Signed)
-  A1c 6.0% will repeat in 3 months  -Goal A1c <7%, less than 6.5 % ideal  -Continue taking mounjaro   -Continue to monitor neuropathy and kidney function as complications from T2DM  -eye exam: UTD, Summer 2024  -foot exam done 05/17/23  -blood pressure at goal of <140/90  -flu vaccine: UTD  -pneumonia vaccine: UTD  -exercise minimum: 150 minutes / week;  at goal  -Treatment: Continue current medication regimen   -Long-term micro and macro vascular complications discussed including blindness, kidney failure, heart attack, stroke and death from prolonged uncontrolled DM  -F/U 3 months     Orders:    tirzepatide  (MOUNJARO ) 7.5 MG/0.5ML injection; Inject 7.5 mg into the skin once a week

## 2023-12-05 NOTE — Assessment & Plan Note (Signed)
 Secondary to diabetes. Last GFR 53, has been stable so was d/c from nephrology. No longer seeing nephrology

## 2023-12-08 ENCOUNTER — Ambulatory Visit (INDEPENDENT_AMBULATORY_CARE_PROVIDER_SITE_OTHER): Payer: Self-pay | Admitting: Family

## 2023-12-08 NOTE — Progress Notes (Signed)
 There is no obvious reason for the leg swelling in the ultrasound. I would like you to follow up with the cardiologist at this time    Regards,   Francy Ishikawa, DNP, FNP-C

## 2023-12-20 ENCOUNTER — Telehealth (INDEPENDENT_AMBULATORY_CARE_PROVIDER_SITE_OTHER): Payer: Self-pay | Admitting: Family

## 2023-12-20 DIAGNOSIS — I10 Essential (primary) hypertension: Secondary | ICD-10-CM

## 2023-12-20 MED ORDER — AMLODIPINE BESYLATE 5 MG PO TABS
5.0000 mg | ORAL_TABLET | Freq: Every day | ORAL | 0 refills | Status: DC
Start: 2023-12-20 — End: 2024-03-15

## 2023-12-20 NOTE — Telephone Encounter (Signed)
 Pt called requesting a refill of the amLODIPine  (NORVASC ) 5 MG tablet medication to be sent to [facility] [address] - [place], [place] - [address]. Medication has been discontinued and cannot be attached to the request. Please advise.    Thanks, [facility]

## 2024-01-04 ENCOUNTER — Telehealth (INDEPENDENT_AMBULATORY_CARE_PROVIDER_SITE_OTHER): Payer: Self-pay | Admitting: Family

## 2024-01-04 NOTE — Telephone Encounter (Signed)
 Patient called stating they have refills for Mounjaro  but [facility] is stating RS has to approve it. Please advise, pt is due for next dosage tomorrow. Thanks, cm

## 2024-01-05 NOTE — Telephone Encounter (Signed)
 Contacted pharmacy to follow up; they reported that they had already taken care of it and the pt has picked up her medication.

## 2024-02-23 ENCOUNTER — Encounter (INDEPENDENT_AMBULATORY_CARE_PROVIDER_SITE_OTHER): Payer: Self-pay | Admitting: Family

## 2024-02-28 ENCOUNTER — Ambulatory Visit (INDEPENDENT_AMBULATORY_CARE_PROVIDER_SITE_OTHER): Admitting: Family

## 2024-03-01 ENCOUNTER — Other Ambulatory Visit (INDEPENDENT_AMBULATORY_CARE_PROVIDER_SITE_OTHER): Payer: Self-pay | Admitting: Family

## 2024-03-01 DIAGNOSIS — E1165 Type 2 diabetes mellitus with hyperglycemia: Secondary | ICD-10-CM

## 2024-03-01 NOTE — Telephone Encounter (Signed)
 Patient states she needs PA for mounjaro .   She can be reached at [phone]  Thanks/sa

## 2024-03-02 ENCOUNTER — Telehealth (INDEPENDENT_AMBULATORY_CARE_PROVIDER_SITE_OTHER): Payer: Self-pay | Admitting: Family

## 2024-03-02 DIAGNOSIS — E1165 Type 2 diabetes mellitus with hyperglycemia: Secondary | ICD-10-CM

## 2024-03-02 NOTE — Telephone Encounter (Signed)
 T/e with patient's pharmacy, they stated that they sent a fax yesterday for us  to complete which has not been received yet; they stated they would fax it over again today for staff to complete so that patient could receive their medication.     T/e with patient to share this information with her, no further questions.

## 2024-03-02 NOTE — Telephone Encounter (Signed)
 Patient states she was supposed to take her weigh loss med yesterday but it looks like she is still waiting for PA approval.  She wants to know how long she can go without taking it  Thanks/sa

## 2024-03-05 NOTE — Telephone Encounter (Signed)
 Pt called this morning stating that she hasn't been using her weight loss medication since the Prior Authorization still needs approval so she now has HBS readings over the 200's. Pt would like to know the status of her P.A and would like a call back about it after lunch today.    Please advise, ty! - Nr.

## 2024-03-06 NOTE — Telephone Encounter (Addendum)
 Key Code:[id number]        PA process:  PA has been submitted and pending response  Dorthie Emanuele (Key: [id number]) - [id number]  Mounjaro  7.5MG /0.5ML auto-injectors  status: PA RequestCreated: July 1st, 2025Sent: July 1st, 2025  Open        PA f/u: Please f/u in [facility]

## 2024-03-06 NOTE — Telephone Encounter (Signed)
 Pt called again regarding this. Informed pt we are still awaiting a fax from the pharmacy. Pt states she will contact the pharmacy- she was provided with the [address] fax to give to pharmacy.     KC

## 2024-03-07 ENCOUNTER — Encounter (INDEPENDENT_AMBULATORY_CARE_PROVIDER_SITE_OTHER): Payer: Self-pay

## 2024-03-07 MED ORDER — TIRZEPATIDE 7.5 MG/0.5ML SC SOAJ
7.5000 mg | SUBCUTANEOUS | 0 refills | Status: DC
Start: 2024-03-07 — End: 2024-03-27

## 2024-03-07 NOTE — Telephone Encounter (Signed)
 Rx sent.

## 2024-03-07 NOTE — Telephone Encounter (Signed)
 Pt called regarding this. She states [facility] contacted [facility] and they told her that we need to resend the prescription. Please resend prescription electronically as soon as possible.    Thanks, KC

## 2024-03-07 NOTE — Addendum Note (Signed)
 Addended by: EULALIA SHACKLETON on: 03/07/2024 03:26 PM     Modules accepted: Orders

## 2024-03-07 NOTE — Telephone Encounter (Signed)
 Jaydrien Wassenaar,CMA

## 2024-03-08 ENCOUNTER — Encounter (INDEPENDENT_AMBULATORY_CARE_PROVIDER_SITE_OTHER): Payer: Self-pay | Admitting: Family

## 2024-03-08 ENCOUNTER — Other Ambulatory Visit (INDEPENDENT_AMBULATORY_CARE_PROVIDER_SITE_OTHER): Payer: Self-pay | Admitting: Family

## 2024-03-08 MED ORDER — FOLIC ACID 1 MG PO TABS
ORAL_TABLET | ORAL | 3 refills | Status: DC
Start: 2024-03-08 — End: 2024-04-23

## 2024-03-08 NOTE — Telephone Encounter (Signed)
 Rx refill request scanned in from [facility] pharmacy for Folic Acid  1MG  Tab.    Please advise, ty! - Nr.

## 2024-03-13 ENCOUNTER — Other Ambulatory Visit (INDEPENDENT_AMBULATORY_CARE_PROVIDER_SITE_OTHER): Payer: Self-pay | Admitting: Family

## 2024-03-13 DIAGNOSIS — E876 Hypokalemia: Secondary | ICD-10-CM

## 2024-03-13 DIAGNOSIS — G4700 Insomnia, unspecified: Secondary | ICD-10-CM

## 2024-03-13 MED ORDER — TRAZODONE HCL 50 MG PO TABS
50.0000 mg | ORAL_TABLET | Freq: Every evening | ORAL | 0 refills | Status: DC
Start: 2024-03-13 — End: 2024-03-27

## 2024-03-13 MED ORDER — POTASSIUM CHLORIDE CRYS ER 10 MEQ PO TBCR
EXTENDED_RELEASE_TABLET | ORAL | 0 refills | Status: DC
Start: 2024-03-13 — End: 2024-03-27

## 2024-03-13 NOTE — Telephone Encounter (Signed)
 Pt called requesting refills on her Trazodone  and Potassium; pt is scheduled with the new provider on 7/22. Pt is completely out of her Potassium, her Trazodone  she has four days left. Please advise.cp

## 2024-03-14 ENCOUNTER — Other Ambulatory Visit (INDEPENDENT_AMBULATORY_CARE_PROVIDER_SITE_OTHER): Payer: Self-pay | Admitting: Family

## 2024-03-14 DIAGNOSIS — I10 Essential (primary) hypertension: Secondary | ICD-10-CM

## 2024-03-27 ENCOUNTER — Encounter (INDEPENDENT_AMBULATORY_CARE_PROVIDER_SITE_OTHER): Payer: Self-pay | Admitting: Family Medicine

## 2024-03-27 ENCOUNTER — Ambulatory Visit (INDEPENDENT_AMBULATORY_CARE_PROVIDER_SITE_OTHER): Admitting: Family Medicine

## 2024-03-27 VITALS — BP 122/72 | Temp 98.1°F | Ht 61.0 in | Wt 135.8 lb

## 2024-03-27 DIAGNOSIS — R051 Acute cough: Secondary | ICD-10-CM

## 2024-03-27 DIAGNOSIS — E782 Mixed hyperlipidemia: Secondary | ICD-10-CM

## 2024-03-27 DIAGNOSIS — M79672 Pain in left foot: Secondary | ICD-10-CM

## 2024-03-27 DIAGNOSIS — G8929 Other chronic pain: Secondary | ICD-10-CM

## 2024-03-27 DIAGNOSIS — G629 Polyneuropathy, unspecified: Secondary | ICD-10-CM

## 2024-03-27 DIAGNOSIS — I10 Essential (primary) hypertension: Secondary | ICD-10-CM

## 2024-03-27 DIAGNOSIS — E1165 Type 2 diabetes mellitus with hyperglycemia: Secondary | ICD-10-CM

## 2024-03-27 DIAGNOSIS — I8390 Asymptomatic varicose veins of unspecified lower extremity: Secondary | ICD-10-CM

## 2024-03-27 DIAGNOSIS — M79671 Pain in right foot: Secondary | ICD-10-CM

## 2024-03-27 DIAGNOSIS — J0141 Acute recurrent pansinusitis: Secondary | ICD-10-CM

## 2024-03-27 DIAGNOSIS — E039 Hypothyroidism, unspecified: Secondary | ICD-10-CM

## 2024-03-27 DIAGNOSIS — E876 Hypokalemia: Secondary | ICD-10-CM

## 2024-03-27 DIAGNOSIS — R600 Localized edema: Secondary | ICD-10-CM

## 2024-03-27 DIAGNOSIS — G4700 Insomnia, unspecified: Secondary | ICD-10-CM

## 2024-03-27 DIAGNOSIS — R0981 Nasal congestion: Secondary | ICD-10-CM

## 2024-03-27 DIAGNOSIS — E538 Deficiency of other specified B group vitamins: Secondary | ICD-10-CM

## 2024-03-27 LAB — URINE MICROALBUMIN W CREATININE POCT
Urine Creatinine POCT: 300 mg/dL (ref 100.0–300.0)
Urine Microalbumin POCT: 80 mg/dL (ref 10.0–150.0)

## 2024-03-27 LAB — POCT HEMOGLOBIN A1C: POCT Hgb A1C: 6 % — AB (ref 4.0–5.9)

## 2024-03-27 MED ORDER — ROSUVASTATIN CALCIUM 20 MG PO TABS: 20.0000 mg | ORAL_TABLET | Freq: Every day | ORAL | 2 refills | Status: DC

## 2024-03-27 MED ORDER — LEVOTHYROXINE SODIUM 25 MCG PO TABS: 25.0000 ug | ORAL_TABLET | Freq: Every day | ORAL | 2 refills | Status: DC

## 2024-03-27 MED ORDER — TIRZEPATIDE 7.5 MG/0.5ML SC SOAJ: 7.5000 mg | SUBCUTANEOUS | 1 refills | Status: DC

## 2024-03-27 MED ORDER — DOXYCYCLINE HYCLATE 100 MG PO CAPS
100.0000 mg | ORAL_CAPSULE | Freq: Two times a day (BID) | ORAL | 0 refills | Status: AC
Start: 2024-03-27 — End: 2024-04-03

## 2024-03-27 MED ORDER — DEXTROMETHORPHAN-GUAIFENESIN ER 30-600 MG PO TB12
1.0000 | ORAL_TABLET | Freq: Two times a day (BID) | ORAL | 0 refills | Status: DC
Start: 2024-03-27 — End: 2024-04-23

## 2024-03-27 MED ORDER — GABAPENTIN 300 MG PO CAPS
300.0000 mg | ORAL_CAPSULE | Freq: Three times a day (TID) | ORAL | 1 refills | Status: DC
Start: 2024-03-27 — End: 2024-03-27

## 2024-03-27 MED ORDER — POTASSIUM CHLORIDE CRYS ER 10 MEQ PO TBCR
EXTENDED_RELEASE_TABLET | ORAL | 2 refills | Status: DC
Start: 2024-03-27 — End: 2024-05-29

## 2024-03-27 MED ORDER — GABAPENTIN 300 MG PO CAPS
300.0000 mg | ORAL_CAPSULE | Freq: Three times a day (TID) | ORAL | 1 refills | Status: DC
Start: 2024-03-27 — End: 2024-06-14

## 2024-03-27 MED ORDER — TRAMADOL HCL 50 MG PO TABS: 50.0000 mg | ORAL_TABLET | Freq: Four times a day (QID) | ORAL | 0 refills | Status: DC | PRN

## 2024-03-27 MED ORDER — TRAMADOL HCL 50 MG PO TABS
50.0000 mg | ORAL_TABLET | Freq: Four times a day (QID) | ORAL | 0 refills | Status: DC | PRN
Start: 2024-03-27 — End: 2024-03-27

## 2024-03-27 MED ORDER — METOPROLOL SUCCINATE ER 50 MG PO TB24: 50.0000 mg | ORAL_TABLET | Freq: Every morning | ORAL | 2 refills | Status: DC

## 2024-03-27 MED ORDER — AMLODIPINE BESYLATE 5 MG PO TABS
5.0000 mg | ORAL_TABLET | Freq: Every day | ORAL | 2 refills | Status: DC
Start: 2024-03-27 — End: 2024-06-20

## 2024-03-27 MED ORDER — LOSARTAN POTASSIUM-HCTZ 100-25 MG PO TABS: 1.0000 | ORAL_TABLET | Freq: Every morning | ORAL | 2 refills | Status: DC

## 2024-03-27 MED ORDER — TRAZODONE HCL 50 MG PO TABS
50.0000 mg | ORAL_TABLET | Freq: Every evening | ORAL | 2 refills | Status: DC
Start: 2024-03-27 — End: 2024-06-28

## 2024-03-27 NOTE — Progress Notes (Deleted)
 [facility]                       Date of Exam: 03/27/2024 9:44 AM        Patient ID: NEHAL WITTING is a 78 y.o. female.  Attending Physician: Delon GORMAN Hancock, MD        Chief Complaint:    No chief complaint on file.                HPI:    Patient presents to the office for the following :     Diabetes:  Currently on tirzepatide  (MOUNJARO ) 7.5 MG/0.5ML injection  Last eye exam: 02/2024, new Glasses  , yearly Visit at the [facility] Doctor   Checking feet regularly? Yes   Medication compliance: as directed   Blood sugars at home: 140 before breakfast   Diet/exercise: No pork , no process meats , low sodium , no fruits / walking 3x/day daily   Chest pain/pressure, blurry vision, numbness/tingling in feet: No   Last micro: Today   Last A1c: Today   Flu shot: UTD   Pneumonia vaccine:  Refill needed: Yes   Labs today: In house A1c, in house micro (within the last year-normal, or last 6 month-abnormal), CMP  Lab Results       Component                Value               Date                       HGBA1C                   6.1 (H)            09/05/2023                 HGBA1C                   6.0 (A)            05/17/2023                 URMICROALB         <5.0                07/21/2022                 URMICROALB       see below         07/21/2022                 GLU                      168 (H)              09/05/2023                 GLU                       178 (H)              02/14/2023                  Hypertension  Currently on amLODIPine  (NORVASC ) 5 MG tablet, losartan -hydrochlorothiazide (HYZAAR) 100-25 MG per tablet,   Average BPs at home: not lately , but  have the BP Device   Medication compliance: as directed   Chest pain/pressure, shortness of breath? No   Exercise/Diet: Please see above   Last micro: Today   Refill needed: Yes   Labs today: CMP, micro (if not done within the last year (normal) or last 6 months (abnormal)  Lab Results        Component                Value               Date                       CREAT                    1.2 (H)             09/05/2023                 CREAT                    1.2 (H)             02/14/2023                 EGFR                     46.1 (L)            09/05/2023                 EGFR                     46.4 (L)            02/14/2023                  Hypothyroidism:  Currently on levothyroxine  (SYNTHROID ) 25 MCG tablet  Medication compliance: as directed   Fatigue, hair loss, constipation, skin changes? No   Last TSH: Please see below   Refill needed: Yes  Labs today: TSH w/ rflx to T4  Lab Results       Component                Value               Date                       TSH                      1.61                09/05/2023                 TSH                      1.25                02/16/2023                 T4FREE                0.92                07/21/2022                 T4FREE  1.00                02/15/2022                 Insomnia:  Currently on Trazodone  50 mg Tablet, daily   Working as expected   No side effects noted   Refill: Yes    Hypokalemia:  Currently on Potassium Chloride  10 MEQ ER Tablet , daily   Refill : Yes     Folic Acid  1 mg Tablet, daily   Refill : Yes    Neuropathy :  Currently on Gabapentin  300 mg Tablet 3x/day  , daily   Working as expected   No side effects noted     RA Infusion done every 4 weeks at Rheumatology Office , [place]  Total Right Shoulder replace done on 2023  Right Knee Replacement done on 2022  Kidney Disease Stage 3   LOV with [facility]iology [place]  [facility] on 04/2024                  Problem List:    Problem List[1]          Current Meds:    Medications Taking[2]       Allergies:    Allergies[3]          Past Surgical History:    Past Surgical History[4]        Family History:    Family History[5]        Social History:    Social History[6]        The following sections were reviewed this encounter by the provider:            Vital Signs:     There were no vitals taken for this visit.         ROS:    Review of Systems           Physical Exam:    Physical Exam         Assessment:    There are no diagnoses linked to this encounter.          Plan:                Follow-up:    No follow-ups on file.         Delon GORMAN Hancock, MD                      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)    Chronic pain of left knee    Stage 3a chronic kidney disease (CMS/HCC)    Positive QuantiFERON-TB Gold test    Other specified arthritis, right shoulder    Osteoarthritis of right shoulder    Atrial septal aneurysm    Cognitive disorder    Insomnia, unspecified type    Hypokalemia   [2]   No outpatient medications have been marked as taking for the 03/27/24 encounter (Appointment) with Hancock Delon GORMAN, MD.   [3]   Allergies  Allergen Reactions    Penicillin G      Reports allergic as a child     [  4]   Past Surgical History:  Procedure Laterality Date    ARTHROPLASTY SHOULDER REVERSE Right 07/01/2022    Procedure: ARTHROPLASTY, SHOULDER REVERSE;  Surgeon: Vicci Evalene RAMAN, MD;  Location: [place] [facility];  Service: Orthopedics;  Laterality: Right;    EYE SURGERY      CATARCTS    FINGER SURGERY Right     INDEX    HYSTERECTOMY      KNEE SURGERY Right 11/30/2019    knee replacement by Dr. Alm Vicci    right reverse shoulder arthroplasty  06/2022    ROTATOR CUFF REPAIR Left     SHOULDER ARTHROSCOPY Right 07/01/2022    Total reverse shoulder    SHOULDER SURGERY Right 2019    Rotator cuff    SHOULDER SURGERY Right     PUT BACK IN SOCKET   [5]   Family History  Problem Relation Name Age of Onset    Diabetes Mother Larkin Potters     Heart failure Father Norleen Potters    [6]   Social History  Tobacco Use    Smoking status: Never    Smokeless tobacco: Never   Vaping Use    Vaping status: Never Used   Substance Use Topics    Alcohol use: Never    Drug use: Yes     Types: Marijuana     Comment: Gummie edibles for pain and anxiety-WILL NOT TAKE 7 DAYS BEFORE DOS

## 2024-03-27 NOTE — Progress Notes (Addendum)
 [facility] AT [facility] - A FOUNDING MEMBER OF [facility]                       Date of Exam: 03/27/2024 3:10 PM        Patient ID: Claudia Adams is a 78 y.o. female.  Attending Physician: Delon GORMAN Hancock, MD        Chief Complaint:    Chief Complaint   Patient presents with    Diabetes     Follow up / Fasting    Hypertension     Follow up     Hypothyroidism     Follow up                  HPI:    Patient presents to the office for the following :     Diabetes:  Currently on tirzepatide  (MOUNJARO ) 7.5 MG/0.5ML injection  Last eye exam: 02/2024, new Glasses  , yearly Visit at the [facility]   Checking feet regularly? Yes   Medication compliance: as directed   Blood sugars at home: 140 before breakfast   Diet/exercise: No pork , no process meats , low sodium , no fruits / walking 3x/day daily   Chest pain/pressure, blurry vision, numbness/tingling in feet: No   Last micro: Today   Last A1c: Today   Flu shot: UTD   Pneumonia vaccine:  Refill needed: Yes   Labs today: In house A1c, in house micro (within the last year-normal, or last 6 month-abnormal), CMP  Lab Results       Component                Value               Date                       HGBA1C                   6.1 (H)            09/05/2023                 HGBA1C                   6.0 (A)            05/17/2023                 URMICROALB         <5.0                07/21/2022                 URMICROALB       see below         07/21/2022                 GLU                      168 (H)              09/05/2023                 GLU                       178 (H)              02/14/2023  Hypertension  Currently on amLODIPine  (NORVASC ) 5 MG tablet, losartan -hydrochlorothiazide (HYZAAR) 100-25 MG per tablet,   Average BPs at home: not lately , but have the BP Device   Medication compliance: as directed   Chest pain/pressure, shortness of breath? No   Exercise/Diet: Please see above   Last micro: Today   Refill needed: Yes   Labs  today: CMP, micro (if not done within the last year (normal) or last 6 months (abnormal)  Lab Results       Component                Value               Date                       CREAT                    1.2 (H)             09/05/2023                 CREAT                    1.2 (H)             02/14/2023                 EGFR                     46.1 (L)            09/05/2023                 EGFR                     46.4 (L)            02/14/2023                Hypothyroidism:  Currently on levothyroxine  (SYNTHROID ) 25 MCG tablet  Medication compliance: as directed   Fatigue, hair loss, constipation, skin changes? No   Last TSH: Please see below   Refill needed: Yes  Labs today: TSH w/ rflx to T4  Lab Results       Component                Value               Date                       TSH                      1.61                09/05/2023                 TSH                      1.25                02/16/2023                 T4FREE                0.92                07/21/2022  T4FREE                1.00                02/15/2022               Insomnia:  Currently on Trazodone  50 mg Tablet, daily   Working as expected   No side effects noted   Refill: Yes    Hypokalemia:  Currently on Potassium Chloride  10 MEQ ER Tablet , daily   Refill : Yes     Folic Acid  1 mg Tablet, daily   Refill : Yes    Neuropathy :  Currently on Gabapentin  300 mg Tablet 3x/day  , daily   Working as expected   No side effects noted     RA Infusion done every 4 weeks at Rheumatology Office , [place]  Total Right Shoulder replace done on 2023  Right Knee Replacement done on 2022  Kidney Disease Stage 3   LOV with [facility] on 04/2024    Having congestion for two weeks  Cough and sputum  Nasal congestion  Sinus pressure       Diabetes      Hypertension    Hypothyroidism              Problem List:    Problem List[1]          Current Meds:    Medications Taking[2]       Allergies:    Allergies[3]          Past  Surgical History:    Past Surgical History[4]        Family History:    Family History[5]        Social History:    Social History[6]        The following sections were reviewed this encounter by the provider:            Vital Signs:    BP 122/72 (BP Site: Right arm, Patient Position: Sitting)   Temp 98.1 F (36.7 C)   Ht 1.549 m (5' 1)   Wt 61.6 kg (135 lb 12.8 oz)   BMI 25.66 kg/m          ROS:    Review of Systems   Constitutional: Negative.    HENT:  Positive for congestion, sinus pressure and sinus pain.    Respiratory:  Negative for shortness of breath.    Cardiovascular:  Positive for leg swelling. Negative for chest pain.   Gastrointestinal: Negative.  Negative for diarrhea and nausea.   Genitourinary: Negative.    Musculoskeletal: Negative.    Skin: Negative.               Physical Exam:    Physical Exam  Constitutional:       Appearance: Normal appearance.   HENT:      Right Ear: Tympanic membrane normal.      Left Ear: Tympanic membrane normal.      Nose: Congestion present.      Mouth/Throat:      Mouth: Mucous membranes are moist.   Eyes:      Pupils: Pupils are equal, round, and reactive to light.   Cardiovascular:      Rate and Rhythm: Normal rate. Rhythm irregular.      Pulses: Normal pulses.      Heart sounds: Normal heart sounds.   Pulmonary:      Effort: Pulmonary effort is normal.  Breath sounds: Normal breath sounds.   Abdominal:      General: Abdomen is flat.      Palpations: Abdomen is soft.   Skin:     General: Skin is warm and dry.   Neurological:      Mental Status: She is alert and oriented to person, place, and time.   Psychiatric:         Mood and Affect: Mood normal.         Behavior: Behavior normal.              Assessment:    1. Essential hypertension  - Comprehensive Metabolic Panel; Future  - Lipid Panel; Future  - CBC with Differential (Order); Future  - amLODIPine  (NORVASC ) 5 MG tablet; Take 1 tablet (5 mg) by mouth once daily  Dispense: 90 tablet; Refill: 2  -  losartan -hydrochlorothiazide (HYZAAR) 100-25 MG per tablet; Take 1 tablet by mouth once every morning  Dispense: 100 tablet; Refill: 2  - metoprolol  succinate XL (TOPROL -XL) 50 MG 24 hr tablet; Take 1 tablet (50 mg) by mouth once every morning  Dispense: 100 tablet; Refill: 2  - Comprehensive Metabolic Panel  - Lipid Panel  - CBC with Differential (Order)    2. Type 2 diabetes mellitus with hyperglycemia, without long-term current use of insulin  (CMS/HCC)  - POCT Hemoglobin A1C; Future  - Urine Microalbumin w Creatinine POCT; Future  - Comprehensive Metabolic Panel; Future  - Lipid Panel; Future  - CBC with Differential (Order); Future  - POCT Hemoglobin A1C  - Urine Microalbumin w Creatinine POCT  - tirzepatide  (MOUNJARO ) 7.5 MG/0.5ML injection; Inject 7.5 mg into the skin once a week  Dispense: 6 mL; Refill: 1  - Comprehensive Metabolic Panel  - Lipid Panel  - CBC with Differential (Order)    3. Hypothyroidism, unspecified type  - TSH; Future  - T4, Free; Future  - levothyroxine  (SYNTHROID ) 25 MCG tablet; Take 1 tablet (25 mcg) by mouth Once a day at 6:00am  Dispense: 100 tablet; Refill: 2  - TSH  - T4, Free    4. Acute cough    5. Complaint of nasal congestion    6. Lower leg edema    7. Acute recurrent pansinusitis  - doxycycline  (VIBRAMYCIN ) 100 MG capsule; Take 1 capsule (100 mg) by mouth 2 (two) times daily for 7 days  Dispense: 14 capsule; Refill: 0  - dextromethorphan -guaiFENesin  (MUCINEX  DM) 30-600 MG per 12 hr tablet; Take 1 tablet by mouth every 12 (twelve) hours  Dispense: 20 tablet; Refill: 0    8. Folic acid  deficiency (non anemic)  - Folate; Future  - Folate    9. Varicose veins of ankle  - Referral Vascular Surgery - EXTERNAL; Future    10. Neuropathy  - gabapentin  (NEURONTIN ) 300 MG capsule; Take 1 capsule (300 mg) by mouth 3 (three) times daily  Dispense: 100 capsule; Refill: 1    11. Hypokalemia  - potassium chloride  (KLOR-CON  M10) 10 MEQ CR tablet; TAKE 1 TABLET(10 MEQ) BY MOUTH DAILY  Dispense:  30 tablet; Refill: 2    12. Mixed hyperlipidemia  - rosuvastatin  (CRESTOR ) 20 MG tablet; Take 1 tablet (20 mg) by mouth once daily  Dispense: 100 tablet; Refill: 2    13. Insomnia, unspecified type  - traZODone  (DESYREL ) 50 MG tablet; Take 1 tablet (50 mg) by mouth once at bedtime Take as needed  Dispense: 30 tablet; Refill: 2    14. Chronic pain of both feet  -  traMADol  (ULTRAM ) 50 MG tablet; Take 1 tablet (50 mg) by mouth every 6 (six) hours as needed for Pain Take as needed and sparingly  Dispense: 90 tablet; Refill: 0            Plan:    Plan for Hypertension :  Medications:  Continue amlodipine , HYZAAR, metoprolol  refilled  Patient Education (educated patient on the following measures):  Diet high in: Fruits, Vegetables, Whole grains, Low-fat dairy, Lean proteins, especially fish and poultry, Nuts, seeds, legumes  Diet low in: Saturated fats (found in fatty meats, full-fat dairy, fried foods), Trans fats (found in processed and packaged foods), Added sugars (limit sugary drinks and desserts), Sodium < 1,500-2,300 mg/day  Weight Management: Target BMI: 18.5-24.9 kg/m  Physical Activity: At least 30 minutes of moderate-intensity exercise most days (150 minutes/week)  Alcohol: pt not drinking  Smoking: pt does not smoke  Stress Management: Relaxation techniques, pt has been managing it well  Blood pressure goal: < 140/90 mmHg  Importance of medication adherence  Home BP monitoring: pt will continue  Reinforcement of lifestyle changes  Monitoring and Follow-Up (instructed patient on the following measures):  Follow up in 3-6 months  Monitor for:   Medication side effects  Electrolyte levels and renal function, especially with ARBs, and diuretics    Plan for DM:  Patient education:   Blood Glucose Monitoring: pt is checking it daily, hypoglycemia is suspected  Lifestyle Management:  Medical Nutrition Therapy: Consistent carbohydrate intake   Healthy diet with balanced carbs, protein, and fats  Physical Activity: pt  is walking 20-30 minutes X 3-5 times a week, intended to increase frequency  Hypoglycemia symptoms discussed  Weight management  Foot care, eye care, BP management, cholesterol control, stress management  Monitoring and Follow-Up  A1C every 3 months  Annual screenings:   Microalbuminuria (start after 5 years of DM or age >=10 years)  Lipid profile   Dilated eye exam   Comprehensive foot exam   Thyroid  function   Educated patient on Red Flags (Immediate Attention):  DKA signs: Nausea, vomiting, abdominal pain, fruity breath, [place]ul respirations, confusion  Severe hypoglycemia: confusion, seizure, unresponsiveness  Persistent hyperglycemia despite insulin  adjustment    Plan for Hypothyroidism:  -     Synthroid  refilled     Plan for Varicose veins of ankle  -     Referral to vascular surgery, information sent to pt's portal  -     Jolyn Pott called pt at 8:20pm to make sure patient received her order for vascular          surgery and a list of locations and phone numbers through portal. Pt said she will check it         and communicate anything through the portal.    Plan for sinusitis:  -     Doxycycline  100mg  bid for 7 days  -     Mucinex  DM BID          Follow-up:    No follow-ups on file.      This patient's treatment plan has been a collaborative effort with APP assistance, with my support and supervision.  More than 40 minutes spent in review of the chart, evaluating the current health parameters, including medications and/or imaging studies, and discussion of the concerns, diagnosis, and plan with the patient, including specific instructions and/or referrals.     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)    Chronic pain of left knee    Stage 3a chronic kidney disease (CMS/HCC)    Positive QuantiFERON-TB Gold test    Other specified arthritis, right shoulder    Osteoarthritis of right shoulder    Atrial septal aneurysm    Cognitive disorder    Insomnia, unspecified type    Hypokalemia   [2]   Outpatient Medications Marked as Taking for the 03/27/24 encounter (Office Visit) with Russella Delon RAMAN, MD   Medication Sig Dispense Refill    acetaminophen  (TYLENOL ) 325 MG tablet Take 1 tablet (325 mg) by mouth every 4 (four) hours      Diclofenac Sodium CR 100 MG 24 hr tablet Take by mouth daily      fexofenadine (ALLEGRA) 180 MG tablet Take 1 tablet (180 mg) by mouth every morning      folic acid  (FOLVITE ) 1 MG tablet TAKE 1 TABLET BY MOUTH EVERY DAY 90 tablet 3    glucose blood (Accu-Chek Guide) test strip USE AS DIRECTED TO TEST BLOOD SUGARS ONCE A DAY 100 strip 0    hydroxychloroquine  (PLAQUENIL ) 200 MG tablet Take 1 tablet (200 mg) by mouth every morning      inFLIXimab (REMICADE IV) Infuse into the vein every 28 days LAST DOSE 8/213/23, NEXT DOSE AFTER SURGERY      Multiple Vitamins-Minerals (Womens 50+ Multi Vitamin/Min) Tab Take 1 tablet by mouth daily      [DISCONTINUED] amLODIPine  (NORVASC ) 5 MG tablet TAKE 1 TABLET BY MOUTH EVERY DAY 90 tablet 0    [DISCONTINUED] gabapentin  (NEURONTIN ) 300 MG capsule Take 1 capsule (300 mg) by mouth 3 (three) times daily 100 capsule 0    [DISCONTINUED] levothyroxine  (SYNTHROID ) 25 MCG tablet Take 1 tablet (25 mcg) by mouth Once a day at 6:00am 100 tablet 1    [DISCONTINUED] losartan -hydrochlorothiazide (HYZAAR) 100-25 MG per tablet Take 1 tablet by mouth every morning 100 tablet 1    [DISCONTINUED] metoprolol  succinate XL (TOPROL -XL) 50 MG 24 hr tablet Take 1 tablet (50 mg) by mouth every morning 100 tablet 1    [DISCONTINUED] potassium chloride  (KLOR-CON  M10) 10 MEQ CR tablet TAKE 1 TABLET(10 MEQ) BY MOUTH DAILY 30 tablet  0    [DISCONTINUED] rosuvastatin  (CRESTOR ) 20 MG tablet Take 1 tablet (20 mg) by mouth daily 100 tablet 1    [DISCONTINUED] tirzepatide  (MOUNJARO ) 7.5 MG/0.5ML injection Inject 7.5 mg into the skin once a week 6 mL 0    [DISCONTINUED] traMADol  (ULTRAM ) 50 MG tablet Take 1 tablet (50 mg) by mouth every 6 (six) hours as needed for Pain      [DISCONTINUED] traZODone  (DESYREL ) 50 MG tablet Take 1 tablet (50 mg) by mouth once at bedtime 30 tablet 0   [3]   Allergies  Allergen Reactions    Penicillin G      Reports allergic as a child     [4]   Past Surgical History:  Procedure Laterality Date    ARTHROPLASTY SHOULDER REVERSE Right 07/01/2022    Procedure: ARTHROPLASTY, SHOULDER REVERSE;  Surgeon: Vicci Evalene RAMAN, MD;  Location: [facility];  Service: Orthopedics;  Laterality: Right;    EYE SURGERY      CATARCTS    FINGER SURGERY Right     INDEX    HYSTERECTOMY      KNEE SURGERY Right 11/30/2019    knee replacement by Dr. Alm Louder    right reverse shoulder arthroplasty  06/2022    ROTATOR CUFF REPAIR Left     SHOULDER ARTHROSCOPY Right 07/01/2022    Total reverse shoulder    SHOULDER SURGERY Right 2019    Rotator cuff    SHOULDER SURGERY Right     PUT BACK IN SOCKET   [5]   Family History  Problem Relation Name Age of Onset    Diabetes Mother Larkin Potters     Heart failure Father Norleen Potters    [6]   Social History  Tobacco Use    Smoking status: Never    Smokeless tobacco: Never   Vaping Use    Vaping status: Never Used   Substance Use Topics    Alcohol use: Never    Drug use: Yes     Types: Marijuana     Comment: Gummie edibles for pain and anxiety-WILL NOT TAKE 7 DAYS BEFORE DOS

## 2024-03-28 ENCOUNTER — Ambulatory Visit (INDEPENDENT_AMBULATORY_CARE_PROVIDER_SITE_OTHER): Payer: Self-pay

## 2024-03-28 LAB — COMPREHENSIVE METABOLIC PANEL
ALT: 18 IU/L (ref 0–32)
AST (SGOT): 27 IU/L (ref 0–40)
Albumin: 4.3 g/dL (ref 3.8–4.8)
Alkaline Phosphatase: 72 IU/L (ref 44–121)
BUN / Creatinine Ratio: 12 (ref 12–28)
BUN: 14 mg/dL (ref 8–27)
Bilirubin, Total: 0.5 mg/dL (ref 0.0–1.2)
CO2: 25 mmol/L (ref 20–29)
Calcium: 9.6 mg/dL (ref 8.7–10.3)
Chloride: 104 mmol/L (ref 96–106)
Creatinine: 1.15 mg/dL — ABNORMAL HIGH (ref 0.57–1.00)
Globulin, Total: 2.5 g/dL (ref 1.5–4.5)
Glucose: 138 mg/dL — ABNORMAL HIGH (ref 70–99)
Potassium: 4 mmol/L (ref 3.5–5.2)
Protein, Total: 6.8 g/dL (ref 6.0–8.5)
Sodium: 143 mmol/L (ref 134–144)
eGFR: 49 mL/min/1.73 — ABNORMAL LOW (ref >59)

## 2024-03-28 LAB — T4, FREE: T4, Free: 1.14 ng/dL (ref 0.82–1.77)

## 2024-03-28 LAB — CBC AND DIFFERENTIAL
Baso(Absolute): 0 x10E3/uL (ref 0.0–0.2)
Basophils Automated: 1 %
Eosinophils Absolute: 0.2 x10E3/uL (ref 0.0–0.4)
Eosinophils Automated: 4 %
Hematocrit: 32.8 % — ABNORMAL LOW (ref 34.0–46.6)
Hemoglobin: 11 g/dL — ABNORMAL LOW (ref 11.1–15.9)
Immature Granulocytes Absolute: 0 x10E3/uL (ref 0.0–0.1)
Immature Granulocytes: 0 %
Lymphocytes Absolute: 2.1 x10E3/uL (ref 0.7–3.1)
Lymphocytes Automated: 31 %
MCH: 30.9 pg (ref 26.6–33.0)
MCHC: 33.5 g/dL (ref 31.5–35.7)
MCV: 92 fL (ref 79–97)
Monocytes Absolute: 0.5 x10E3/uL (ref 0.1–0.9)
Monocytes: 7 %
Neutrophils Absolute Count: 3.8 x10E3/uL (ref 1.4–7.0)
Neutrophils: 57 %
Platelets: 184 x10E3/uL (ref 150–450)
RBC: 3.56 x10E6/uL — ABNORMAL LOW (ref 3.77–5.28)
RDW: 12.3 % (ref 11.7–15.4)
WBC: 6.6 x10E3/uL (ref 3.4–10.8)

## 2024-03-28 LAB — LIPID PANEL
Cholesterol / HDL Ratio: 1.8 ratio (ref 0.0–4.4)
Cholesterol: 130 mg/dL (ref 100–199)
HDL: 71 mg/dL (ref >39)
LDL Chol Calculated (NIH): 44 mg/dL (ref 0–99)
Triglycerides: 72 mg/dL (ref 0–149)
VLDL Calculated: 15 mg/dL (ref 5–40)

## 2024-03-28 LAB — TSH: TSH: 1.46 u[IU]/mL (ref 0.450–4.500)

## 2024-03-28 LAB — FOLATE: Folate: >20 ng/mL (ref >3.0)

## 2024-04-05 ENCOUNTER — Ambulatory Visit
Admission: RE | Admit: 2024-04-05 | Discharge: 2024-04-05 | Disposition: A | Discharge status: Home / Self Care | Source: Ambulatory Visit | Attending: Cardiovascular Disease | Admitting: Cardiovascular Disease

## 2024-04-05 ENCOUNTER — Encounter (INDEPENDENT_AMBULATORY_CARE_PROVIDER_SITE_OTHER): Payer: Self-pay | Admitting: Cardiovascular Disease

## 2024-04-05 DIAGNOSIS — I253 Aneurysm of heart: Secondary | ICD-10-CM | POA: Insufficient documentation

## 2024-04-05 LAB — ECHO ADULT TTE LIMITED

## 2024-04-06 ENCOUNTER — Ambulatory Visit: Admission: RE | Admit: 2024-04-06 | Payer: Medicare Other | Source: Ambulatory Visit

## 2024-04-23 ENCOUNTER — Encounter (INDEPENDENT_AMBULATORY_CARE_PROVIDER_SITE_OTHER): Payer: Self-pay | Admitting: Cardiology

## 2024-04-23 ENCOUNTER — Ambulatory Visit (INDEPENDENT_AMBULATORY_CARE_PROVIDER_SITE_OTHER): Payer: Medicare Other | Admitting: Cardiology

## 2024-04-23 DIAGNOSIS — I10 Essential (primary) hypertension: Secondary | ICD-10-CM

## 2024-04-23 DIAGNOSIS — E119 Type 2 diabetes mellitus without complications: Secondary | ICD-10-CM

## 2024-04-23 DIAGNOSIS — E782 Mixed hyperlipidemia: Secondary | ICD-10-CM

## 2024-04-23 NOTE — Progress Notes (Signed)
 [place]  [facility] PROGRESS NOTE    [facility] OFFICE  [place]  [facility] OFF[facility] -CARDIOLOGY  [address]  [place] [address]  Dept: [phone]  Dept Fax: [phone]     Patient Name: Claudia Adams, Claudia Adams  Date of Visit:  April 23, 2024  Date of Birth: 01/27/1946  AGE: 78 y.o.  Medical Record #: [id number]  Requesting Physician: Delon GORMAN Hancock, MD    CHIEF COMPLAINT:  Hypertension, Follow-up, and Hyperlipidemia    HISTORY OF PRESENT ILLNESS    Claudia Adams is being seen today for cardiovascular evaluation. History obtained from patient.     Coming in for  follow up. Last seen by [facility] 03/2023 by myself where primary discussion was about HTN. Not checking BP as much at home but at infusions similar to here. Most active activity is walking. Currently, no chest pain, dyspnea with her level of exertion. No orthopnea, PND or LE edema. No episodes of exertional lightheadedness, palpitations or syncope.     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     PAST MEDICAL HISTORY: She has a past medical history of Anemia, Anxiety, Arrhythmia, Diabetes mellitus (CMS/HCC) (25 years), Disorder of thyroid , Ear, nose and throat disorder, Hyperlipidemia, Hypertension (25 years ago), Hypothyroidism, Neuropathy, Other specified arthritis, right shoulder, Pulmonary hypertension (CMS/HCC), Renal insufficiency, Rheumatoid arthritis (CMS/HCC), Seasonal allergic rhinitis, and Type 2 diabetes mellitus, controlled (CMS/HCC). She has a past surgical history that includes Shoulder surgery (Right, 2019); Knee surgery (Right, 11/30/2019); Hysterectomy; Finger surgery (Right); Eye surgery; Rotator cuff repair (Left); Shoulder surgery (Right); right reverse shoulder arthroplasty (06/2022); Shoulder arthroscopy (Right, 07/01/2022); and ARTHROPLASTY SHOULDER REVERSE (Right, 07/01/2022).    Allergies  Review status set to Review Complete by  Zhu, Jiajia, RN on 03/27/2024        Severity Reactions Comments    Penicillin G Not Specified  Reports allergic as a child           MEDICATIONS:   Patient's current medications were reviewed. ONLY Cardiac medications were updated unless others were addressed in assessment and plan.    Current Outpatient Medications (Relevant to Cardiology)   Medication Sig    amLODIPine  Take 1 tablet (5 mg) by mouth once daily    losartan -hydrochlorothiazide Take 1 tablet by mouth once every morning    metoprolol  succinate XL Take 1 tablet (50 mg) by mouth once every morning    rosuvastatin  Take 1 tablet (20 mg) by mouth once daily     Current Outpatient Medications (Other)   Medication Sig    hydroxychloroquine  Take 1 tablet (200 mg) by mouth every morning    Womens 50+ Multi Vitamin/Min Take 1 tablet by mouth daily    inFLIXimab (REMICADE IV) Infuse into the vein every 28 days LAST DOSE 8/213/23, NEXT DOSE AFTER SURGERY    fexofenadine Take 1 tablet (180 mg) by mouth every morning    acetaminophen  Take 1 tablet (325 mg) by mouth every 4 (four) hours    Accu-Chek Guide USE AS DIRECTED TO TEST BLOOD SUGARS ONCE A DAY    levothyroxine  Take 1 tablet (25 mcg) by mouth Once a day at 6:00am    potassium chloride  TAKE 1 TABLET(10 MEQ) BY MOUTH DAILY    tirzepatide  Inject 7.5 mg into the skin once a week    traZODone  Take 1 tablet (50 mg) by mouth once at bedtime Take as needed    traMADol  Take 1 tablet (  50 mg) by mouth every 6 (six) hours as needed for Pain Take as needed and sparingly    gabapentin  Take 1 capsule (300 mg) by mouth 3 (three) times daily     FAMILY HISTORY: family history includes Diabetes in her mother; Heart failure in her father.    SOCIAL HISTORY: She reports that she has never smoked. She has never used smokeless tobacco. She reports current drug use. Drug: Marijuana. She reports that she does not drink alcohol.    PHYSICAL EXAMINATION    Visit Vitals  BP 146/66 (BP Site: Right arm, Patient Position: Sitting, Cuff  Size: Medium)   Pulse 69   Ht 1.549 m (5' 1)   Wt 60.8 kg (134 lb)   BMI 25.32 kg/m      General/Constitutional: Not distress. Well appearing.   Neck: JVP flat. Normal neck circumference.   Cardiac: Regular rate and rhythm. Normal S1 and S2. No murmurs  Pulmonary: Normal effort. + Lung Sounds. no wheezing. No rales.   GI: not distended. + bowel sounds  Extremities: Warm. No edema  Neuro: Normal gait. AOx3.  Psych: Not depressed. not anxious.     LABS AND DIAGNOSTICS REVIEWED:   Lab Results   Component Value Date    WBC 6.6 03/27/2024    HGB 11.0 (L) 03/27/2024    HCT 32.8 (L) 03/27/2024    PLT 184 03/27/2024     Lab Results   Component Value Date    GLU 138 (H) 03/27/2024    BUN 14 03/27/2024    CREAT 1.15 (H) 03/27/2024    NA 143 03/27/2024    K 4.0 03/27/2024    CL 104 03/27/2024    CO2 25 03/27/2024    AST 27 03/27/2024    ALT 18 03/27/2024     Lab Results   Component Value Date    TSH 1.460 03/27/2024    HGBA1C 6.0 (A) 03/27/2024     Lab Results   Component Value Date    CHOL 130 03/27/2024    TRIG 72 03/27/2024    HDL 71 03/27/2024    LDL 44 03/27/2024     No results found for: [facility]    Diagnostics personally reviewed or personally interpreted (will be indicated below) with description below:   TTE 04/2023 and Limited TTE 03/2024    IMPRESSION:   Claudia Adams is a 78 y.o. female with the following pertinent problem list    HTN  TTE 04/2023; EF 55%. Nml thickness. Grade I DD. Nml LA. Aneurysmal ASA w/ negative bubble study 03/2024  HLD controlled on Statin Therapy  RA on Anti-TNF therapy  DM with A1C at 6.0 (03/2024) on Mounjaro   Stage II/IIIa CKD with Cr 1.2 (27974)  Hypothyroidism  Body mass index is 25.32 kg/m.    RECOMMENDATIONS:  Overall seems to be doing well. Has good cholesterol and diabetes control. Does have wider pulse pressure reflective of vascular stiffness and slightly elevated systolic BP. No med adjustments today but through diet/exercise would like to see SBP < 140 consistently at least.      Patient will follow up in 12 months or sooner if earlier medical attention is needed.                                                Orders Placed This Encounter   Procedures  Office Visit ([facility])     No orders of the defined types were placed in this encounter.    Signed:    Marlyse Gore MD [facility]    Please pardon any potential grammatical or typographical errors as aspects of this note may have been created through speech to text software.

## 2024-05-28 ENCOUNTER — Other Ambulatory Visit (INDEPENDENT_AMBULATORY_CARE_PROVIDER_SITE_OTHER): Payer: Self-pay | Admitting: Family Medicine

## 2024-05-28 DIAGNOSIS — E876 Hypokalemia: Secondary | ICD-10-CM

## 2024-06-07 ENCOUNTER — Other Ambulatory Visit (INDEPENDENT_AMBULATORY_CARE_PROVIDER_SITE_OTHER): Payer: Self-pay | Admitting: Family

## 2024-06-07 DIAGNOSIS — Z794 Long term (current) use of insulin: Secondary | ICD-10-CM

## 2024-06-14 ENCOUNTER — Other Ambulatory Visit (INDEPENDENT_AMBULATORY_CARE_PROVIDER_SITE_OTHER): Payer: Self-pay | Admitting: Family Medicine

## 2024-06-14 DIAGNOSIS — G629 Polyneuropathy, unspecified: Secondary | ICD-10-CM

## 2024-06-14 MED ORDER — GABAPENTIN 300 MG PO CAPS: 300.0000 mg | ORAL_CAPSULE | Freq: Three times a day (TID) | ORAL | 1 refills | Status: DC

## 2024-06-20 ENCOUNTER — Other Ambulatory Visit (INDEPENDENT_AMBULATORY_CARE_PROVIDER_SITE_OTHER): Payer: Self-pay | Admitting: Family

## 2024-06-20 DIAGNOSIS — I10 Essential (primary) hypertension: Secondary | ICD-10-CM

## 2024-06-28 ENCOUNTER — Other Ambulatory Visit (INDEPENDENT_AMBULATORY_CARE_PROVIDER_SITE_OTHER): Payer: Self-pay | Admitting: Family Medicine

## 2024-06-28 DIAGNOSIS — G4700 Insomnia, unspecified: Secondary | ICD-10-CM

## 2024-07-04 ENCOUNTER — Other Ambulatory Visit (INDEPENDENT_AMBULATORY_CARE_PROVIDER_SITE_OTHER): Payer: Self-pay

## 2024-07-16 ENCOUNTER — Other Ambulatory Visit (INDEPENDENT_AMBULATORY_CARE_PROVIDER_SITE_OTHER): Payer: Self-pay | Admitting: Family Medicine

## 2024-08-27 ENCOUNTER — Encounter (INDEPENDENT_AMBULATORY_CARE_PROVIDER_SITE_OTHER): Payer: Self-pay

## 2024-09-04 ENCOUNTER — Other Ambulatory Visit (INDEPENDENT_AMBULATORY_CARE_PROVIDER_SITE_OTHER): Payer: Self-pay | Admitting: Family Medicine

## 2024-09-04 DIAGNOSIS — E876 Hypokalemia: Secondary | ICD-10-CM

## 2024-09-10 ENCOUNTER — Ambulatory Visit (INDEPENDENT_AMBULATORY_CARE_PROVIDER_SITE_OTHER)

## 2024-09-13 ENCOUNTER — Other Ambulatory Visit (INDEPENDENT_AMBULATORY_CARE_PROVIDER_SITE_OTHER): Payer: Self-pay

## 2024-09-13 ENCOUNTER — Telehealth (INDEPENDENT_AMBULATORY_CARE_PROVIDER_SITE_OTHER): Payer: Self-pay | Admitting: Family Medicine

## 2024-09-13 DIAGNOSIS — E876 Hypokalemia: Secondary | ICD-10-CM

## 2024-09-13 MED ORDER — POTASSIUM CHLORIDE CRYS ER 10 MEQ PO TBCR: EXTENDED_RELEASE_TABLET | ORAL | 0 refills | Status: DC

## 2024-09-13 NOTE — Telephone Encounter (Signed)
Rx sent   kl rn

## 2024-09-13 NOTE — Telephone Encounter (Signed)
 Patient called to check on status of potassium refill - has AWV scheduled for next week but has been out of medication for a few days so is requesting a short term amount to hold her over. Thank you!  JG

## 2024-09-14 ENCOUNTER — Other Ambulatory Visit (INDEPENDENT_AMBULATORY_CARE_PROVIDER_SITE_OTHER): Payer: Self-pay | Admitting: Family Medicine

## 2024-09-14 DIAGNOSIS — E1142 Type 2 diabetes mellitus with diabetic polyneuropathy: Secondary | ICD-10-CM

## 2024-09-25 ENCOUNTER — Ambulatory Visit (INDEPENDENT_AMBULATORY_CARE_PROVIDER_SITE_OTHER)

## 2024-09-25 ENCOUNTER — Encounter (INDEPENDENT_AMBULATORY_CARE_PROVIDER_SITE_OTHER): Payer: Self-pay

## 2024-09-25 VITALS — BP 126/78 | HR 72 | Temp 97.9°F | Resp 16 | Ht 61.0 in | Wt 142.6 lb

## 2024-09-25 DIAGNOSIS — E039 Hypothyroidism, unspecified: Secondary | ICD-10-CM

## 2024-09-25 DIAGNOSIS — M545 Low back pain, unspecified: Secondary | ICD-10-CM

## 2024-09-25 DIAGNOSIS — E1165 Type 2 diabetes mellitus with hyperglycemia: Secondary | ICD-10-CM

## 2024-09-25 DIAGNOSIS — G4700 Insomnia, unspecified: Secondary | ICD-10-CM

## 2024-09-25 DIAGNOSIS — I10 Essential (primary) hypertension: Secondary | ICD-10-CM

## 2024-09-25 DIAGNOSIS — E782 Mixed hyperlipidemia: Secondary | ICD-10-CM

## 2024-09-25 DIAGNOSIS — E11618 Type 2 diabetes mellitus with other diabetic arthropathy: Secondary | ICD-10-CM

## 2024-09-25 DIAGNOSIS — M79671 Pain in right foot: Secondary | ICD-10-CM

## 2024-09-25 DIAGNOSIS — M79672 Pain in left foot: Secondary | ICD-10-CM

## 2024-09-25 DIAGNOSIS — G8929 Other chronic pain: Secondary | ICD-10-CM

## 2024-09-25 MED ORDER — TRAZODONE HCL 50 MG PO TABS: ORAL_TABLET | ORAL | 0 refills | Status: AC

## 2024-09-25 MED ORDER — TIRZEPATIDE 7.5 MG/0.5ML SC SOAJ: 7.5000 mg | SUBCUTANEOUS | 1 refills | Status: AC

## 2024-09-25 MED ORDER — METOPROLOL SUCCINATE ER 50 MG PO TB24: 50.0000 mg | ORAL_TABLET | Freq: Every morning | ORAL | 1 refills | Status: AC

## 2024-09-25 MED ORDER — ROSUVASTATIN CALCIUM 20 MG PO TABS: 20.0000 mg | ORAL_TABLET | Freq: Every day | ORAL | 1 refills | Status: AC

## 2024-09-25 MED ORDER — AMLODIPINE BESYLATE 5 MG PO TABS: 5.0000 mg | ORAL_TABLET | Freq: Every day | ORAL | 1 refills | Status: AC

## 2024-09-25 MED ORDER — TRAMADOL HCL 50 MG PO TABS: 50.0000 mg | ORAL_TABLET | Freq: Four times a day (QID) | ORAL | 0 refills | Status: AC | PRN

## 2024-09-25 MED ORDER — LOSARTAN POTASSIUM-HCTZ 100-25 MG PO TABS: 1.0000 | ORAL_TABLET | Freq: Every morning | ORAL | 1 refills | Status: AC

## 2024-09-25 NOTE — Progress Notes (Signed)
 Date of Exam: 09/25/2024 11:04 AM        Patient ID: Claudia Adams is a 79 y.o. female.  Attending Physician: Sherryn Ruth, PA        Chief Complaint:    Chief Complaint   Patient presents with    Annual Exam    Medicare Annual Wellness Visit               HPI:    HPI  Hyperlipidemia:  rosuvastatin  (CRESTOR ) 20 MG tablet   Stable and doing well overall on current medication.   Diet/exercise: Had spinal infusion 1 month ago, limited mobility - no set regimen for exercise as a result. Stays active cleaning the house. Diet: healthy, well balanced and normal portions. Good variety of protein and vegetable sources.   Medication compliance: Takes in the morning.  Chest pain/pressure, leg cramping, headache, dizziness, shortness of breath? None.  Refill needed: Yes.  Labs today: Lipid Panel.    Lab Results   Component Value Date    CHOL 130 03/27/2024    CHOL 126 09/05/2023    TRIG 72 03/27/2024    TRIG 56 09/05/2023    HDL 71 03/27/2024    HDL 70 09/05/2023    LDL 44 03/27/2024    LDL 45 09/05/2023    AST 27 03/27/2024    AST 28 09/05/2023    ALT 18 03/27/2024    ALT 19 09/05/2023       Hypothyroidism:  levothyroxine  (SYNTHROID ) 25 MCG tablet   Stable and doing well on current medication.   Medication compliance: Taken in the morning without food.  Fatigue, hair loss, constipation, skin changes? Hair loss, has seen dermatologist.  Last TSH: 1.46 July 2025.   Refill needed: Yes  Labs today: TSH w/ rflx to T4    Lab Results   Component Value Date    TSH 1.460 03/27/2024    TSH 1.61 09/05/2023    T4FREE 1.14 03/27/2024    T4FREE 0.92 07/21/2022       Hypertension  losartan -hydrochlorothiazide (HYZAAR) 100-25 MG per tablet x1  metoprolol  succinate XL (TOPROL -XL) 50 MG 24 hr tablet x1  amLODIPine  (NORVASC ) 5 MG tablet x1  Stable and doing well overall on current medications.   Average BPs at home: Takes at home, usually 120/60.  Medication compliance: Taken in the morning .  Chest pain, leg  cramping, headache, dizziness, shortness of breath? None.   Last micro: normal today.   Refill needed: Yes  Labs today: CMP, micro (if not done within the last year (norma) or last 6 months (abnormal)    Lab Results   Component Value Date    CREAT 1.15 (H) 03/27/2024    CREAT 1.2 (H) 09/05/2023    EGFR 49 (L) 03/27/2024    EGFR 46.1 (L) 09/05/2023      Weight Loss  tirzepatide  (MOUNJARO ) 7.5 MG/0.5ML injection   Stable and doing well on current medications.   Medication compliance: Does injections weekly on Thursdays.   No skin reactions or side effects.   Current weight: 142.6lb  Weight at last visit: 134lb in 04/2024  Denies nausea, constipation, diarrhea, stomach cramping? None  Refill: Yes.  Had a bit of a lapse during the holidays but getting back on track.     traZODone  (DESYREL ) 50 MG tablet x1 taken in morning for insomnia   traMADol  (ULTRAM ) 50 MG tablet PRN, taken for chronic pain. Uses this mostly at night about a couple of times per week.  gabapentin  (NEURONTIN ) 100 MG capsule x 3, taken in morning, in afternoon, and before bed, taken for chronic shoulder and back pain.    Chronic back pain and instability  - Chronic back pain persists following spinal fusion performed one month ago.  - Nerve ablation test last week did not reduce pain.  - Prior X-ray and MRI demonstrated significant arthritis and scoliosis.  - Unsteadiness with walking.  - Receives Remicade infusions for RA management.  - Physical therapy previously attempted without improvement in symptoms.            Problem List:    Problem List[1]          Current Meds:    Medications Taking[2]       Allergies:    Allergies[3]          Past Surgical History:    Past Surgical History[4]        Family History:    Family History[5]        Social History:    Social History[6]        The following sections were reviewed this encounter by the provider:   Tobacco  Allergies  Meds  Problems  Med Hx  Surg Hx  Fam Hx             Vital Signs:    BP  126/78 (BP Site: Left arm, Patient Position: Sitting)   Pulse 72   Temp 97.9 F (36.6 C)   Resp 16   Ht 1.549 m (5' 1)   Wt 64.7 kg (142 lb 9.6 oz)   SpO2 96%   BMI 26.94 kg/m          ROS:    Review of Systems   Constitutional:  Negative for chills, diaphoresis, fatigue and fever.   HENT:  Negative for ear discharge, ear pain, hearing loss and tinnitus.    Eyes:  Negative for visual disturbance.   Respiratory:  Negative for chest tightness, shortness of breath and wheezing.    Cardiovascular:  Negative for chest pain and palpitations.   Gastrointestinal:  Negative for abdominal pain, constipation, diarrhea, nausea and vomiting.   Endocrine: Negative for polydipsia, polyphagia and polyuria.   Genitourinary:  Negative for frequency and urgency.   Musculoskeletal:  Positive for arthralgias and back pain. Negative for myalgias.   Neurological:  Negative for dizziness, weakness, light-headedness, numbness and headaches.   Psychiatric/Behavioral:  Negative for decreased concentration and sleep disturbance. The patient is not nervous/anxious.                 Physical Exam:    Physical Exam  Vitals and nursing note reviewed.   Constitutional:       General: She is awake. She is not in acute distress.     Appearance: Normal appearance.   HENT:      Head: Normocephalic and atraumatic.      Right Ear: Hearing, tympanic membrane, ear canal and external ear normal.      Left Ear: Hearing, tympanic membrane, ear canal and external ear normal.      Nose: Nose normal.      Mouth/Throat:      Lips: Pink.      Mouth: Mucous membranes are moist.   Eyes:      General: Lids are normal. Vision grossly intact. Gaze aligned appropriately.   Cardiovascular:      Rate and Rhythm: Normal rate and regular rhythm.      Pulses: Normal pulses.  Heart sounds: Normal heart sounds. No murmur heard.  Pulmonary:      Effort: Pulmonary effort is normal.      Breath sounds: Normal breath sounds. No decreased breath sounds, wheezing,  rhonchi or rales.   Abdominal:      General: Abdomen is flat. Bowel sounds are normal.      Palpations: Abdomen is soft.      Tenderness: There is no abdominal tenderness.   Musculoskeletal:      Cervical back: Normal, normal range of motion and neck supple.      Thoracic back: Normal.      Lumbar back: Tenderness and bony tenderness present. No swelling, edema or spasms. Decreased range of motion (from pain).   Skin:     General: Skin is warm and moist.      Findings: No lesion or rash.   Neurological:      General: No focal deficit present.      Mental Status: She is alert and oriented to person, place, and time.   Psychiatric:         Attention and Perception: Attention normal.         Mood and Affect: Mood normal.         Speech: Speech normal.         Behavior: Behavior is cooperative.         Cognition and Memory: Cognition normal.                    Assessment:    1. Type 2 diabetes mellitus with other diabetic arthropathy, without long-term current use of insulin  (CMS/HCC)  - Comprehensive Metabolic Panel; Future  - Hemoglobin A1C; Future  - Comprehensive Metabolic Panel  - Hemoglobin A1C    2. Essential hypertension  - amLODIPine  (NORVASC ) 5 MG tablet; Take 1 tablet (5 mg) by mouth once daily  Dispense: 90 tablet; Refill: 1  - losartan -hydrochlorothiazide (HYZAAR) 100-25 MG per tablet; Take 1 tablet by mouth once every morning  Dispense: 90 tablet; Refill: 1  - metoprolol  succinate XL (TOPROL -XL) 50 MG 24 hr tablet; Take 1 tablet (50 mg) by mouth once every morning  Dispense: 90 tablet; Refill: 1  - Comprehensive Metabolic Panel; Future  - Comprehensive Metabolic Panel    3. Mixed hyperlipidemia  - rosuvastatin  (CRESTOR ) 20 MG tablet; Take 1 tablet (20 mg) by mouth once daily  Dispense: 90 tablet; Refill: 1  - Lipid Panel; Future  - Lipid Panel    4. Hypothyroidism, unspecified type  - TSH; Future  - T4, Free; Future  - TSH  - T4, Free    5. Chronic low back pain without sciatica, unspecified back pain  laterality  - traMADol  (ULTRAM ) 50 MG tablet; Take 1 tablet (50 mg) by mouth every 6 (six) hours as needed for Pain Take as needed and sparingly  Dispense: 90 tablet; Refill: 0    6. Chronic pain of both feet  - traMADol  (ULTRAM ) 50 MG tablet; Take 1 tablet (50 mg) by mouth every 6 (six) hours as needed for Pain Take as needed and sparingly  Dispense: 90 tablet; Refill: 0    7. Insomnia, unspecified type  - traZODone  (DESYREL ) 50 MG tablet; TAKE 1 TABLET(50 MG) BY MOUTH 1 TIME AT BEDTIME AS NEEDED  Dispense: 90 tablet; Refill: 0    8. 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   - MRI and X-ray confirm significant arthritis.  -  Physical therapy ongoing but not yet effective.   - Scoliosis contributes to pain and posture issues.  - Continue follow-up with orthopedic and pain management specialist on February 4th for further injection.  - Requested refill on Tramadol  today that she uses sparingly for her pain spikes along with her gabapentin  that is covered by her specialist.   - Follow up as needed.     Essential hypertension  - Managed with amlodipine , metoprolol , and losartan  hydrochlorothiazide.   - Blood pressure was elevated in August but normalized recently. Initially 142/74 in office and then 126/78 on recheck.  -  Current regimen is effective but indicates a more resistant form of hypertension.  - Continue current antihypertensive regimen and keep a log if possible.  - Sent in refill to mail order pharmacy.  - Follow up in 3-6 months or sooner as needed.     Mixed hyperlipidemia  - Stable and doing well overall on current medications.  - Managed with Crestor  20 mg.   - Recent  cholesterol levels are well-controlled.  - Continue Crestor  20 mg at same prescription regimen.  - Continue to work on diet and exercise habits. Diet should be varied in both vegetable and protein sources. Ensure you have a variation in food groups and avoid excess sugars, salts, and carbohydrates. Avoid tobacco and intake alcohol in moderation. Add in a fish oil supplement if possible.  - Activity goal should be about 150 minutes per week on average. Good examples are low impact exercises such as walking, cycling, yoga/pilates, and swimming. Stretch before strenuous activity.   - Sent in refill to mail order pharmacy.  - Follow up in 3-6 months or sooner as needed.     Type 2 diabetes mellitus  - Stable and doing well overall on current medication.  - Managed with Mounjaro  7.5 mg injection once weekly  - Recent A1C levels are well-controlled.  - Continue on same prescription regimen.  - Continue to work on diet and exercise habits.   - Sent in refill to mail order pharmacy.  - Follow up in 3-6 months or sooner as needed.     Hypothyroidism  - Managed with thyroid  medication levothyroxine .  - Stable recently and doing well overall.  - Recent labs to be checked to confirm stable levels.  - Will recheck thyroid  labs including TSH and T4 and alter dosage from there.  - Will continue current thyroid  medication dosage of 25 mcg daily.  - Hold on sending in refill to mail order pharmacy.  - Follow up in 3-6 months or sooner as needed.     Insomnia  - Trazodone  is used as needed for insomnia.   - Stable and doing well on current medication.  - Magnesium  added at night for additional support, which helps with sleep quality.  - Continue trazodone  for insomnia 50 mg tablet at bedtime as needed.  - Continue magnesium  supplementation at night.  - Sent in refill to mail order pharmacy.  - Follow up in 3-6 months or sooner as needed.     Health Maintenance:   Discussed lifestyle recommendations: walking or other exercise to  promote a healthy mood and boost energy. Follow a diet rich in fruits/vegetables, avoid snacks and processed foods. Follow up as needed, or to further discuss any concerns addressed today.        Follow-up:    No follow-ups on file.         Sherryn Ruth, PA       Obtained consent to use [facility] services for medical purposes.             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)    Chronic pain of left knee    Stage 3a chronic kidney disease (CMS/HCC)    Positive QuantiFERON-TB Gold test    Other specified arthritis, right shoulder    Osteoarthritis of right shoulder    Atrial septal aneurysm    Cognitive disorder    Insomnia, unspecified type    Hypokalemia   [2]   Outpatient Medications Marked as Taking for the 09/25/24 encounter (Office Visit) with Ruth Sherryn, PA   Medication Sig Dispense Refill    Accu-Chek Guide Test test strip USE 1 TEST STRIP DAILY FOR BLOOD GLUCOSE 100 strip 0    fexofenadine (ALLEGRA) 180 MG tablet Take 1 tablet (180 mg) by mouth every morning      gabapentin  (NEURONTIN ) 100 MG capsule Take 1 capsule (100 mg) by mouth 3 (three) times daily      hydroxychloroquine  (PLAQUENIL ) 200 MG tablet Take 1 tablet (200 mg) by mouth every morning      inFLIXimab (REMICADE IV) Infuse into the vein every 28 days LAST DOSE 8/213/23, NEXT DOSE AFTER SURGERY      Insulin  Pen Needle (BD Pen Needle Nano 2nd Gen) 32G X 4 MM Misc USE FOR INSULIN  INJECTION ONCE/DAY      levothyroxine  (SYNTHROID ) 25 MCG tablet Take 1 tablet (25 mcg) by mouth Once a day at 6:00am 100 tablet 2    Multiple Vitamins-Minerals (Womens 50+ Multi Vitamin/Min) Tab Take 1 tablet by mouth daily      potassium chloride  (KLOR-CON   M) 10 MEQ CR tablet TAKE 1 TABLET(10 MEQ) BY MOUTH DAILY 30 tablet 0    [DISCONTINUED] amLODIPine  (NORVASC ) 5 MG tablet TAKE 1 TABLET BY MOUTH EVERY DAY 90 tablet 2    [DISCONTINUED] gabapentin  (NEURONTIN ) 300 MG capsule Take 1 capsule (300 mg) by mouth 3 (three) times daily 100 capsule 1    [DISCONTINUED] losartan -hydrochlorothiazide (HYZAAR) 100-25 MG per tablet Take 1 tablet by mouth once every morning 100 tablet 2    [DISCONTINUED] rosuvastatin  (CRESTOR ) 20 MG tablet Take 1 tablet (20 mg) by mouth once daily 100 tablet 2    [DISCONTINUED] tirzepatide  (MOUNJARO ) 7.5 MG/0.5ML injection Inject 7.5 mg into the skin once a week 6 mL 1    [DISCONTINUED] traMADol  (ULTRAM ) 50 MG tablet Take 1 tablet (50 mg) by mouth every 6 (six) hours as needed for Pain Take as needed and sparingly 90 tablet 0    [DISCONTINUED] traZODone  (DESYREL ) 50 MG tablet TAKE 1 TABLET(50 MG) BY MOUTH 1 TIME AT BEDTIME AS NEEDED 30 tablet 2   [3]   Allergies  Allergen Reactions    Penicillin G  Reports allergic as a child     [4]   Past Surgical History:  Procedure Laterality Date    ARTHROPLASTY SHOULDER REVERSE Right 07/01/2022    Procedure: ARTHROPLASTY, SHOULDER REVERSE;  Surgeon: Vicci Evalene RAMAN, MD;  Location: [facility];  Service: Orthopedics;  Laterality: Right;    EYE SURGERY      CATARCTS    FINGER SURGERY Right     INDEX    HYSTERECTOMY      KNEE SURGERY Right 11/30/2019    knee replacement by Dr. Alm Vicci    right reverse shoulder arthroplasty  06/2022    ROTATOR CUFF REPAIR Left     SHOULDER ARTHROSCOPY Right 07/01/2022    Total reverse shoulder    SHOULDER SURGERY Right 2019    Rotator cuff    SHOULDER SURGERY Right     PUT BACK IN SOCKET   [5]   Family History  Problem Relation Name Age of Onset    Diabetes Mother Larkin Potters     Heart failure Father Norleen Potters    [6]   Social History  Tobacco Use    Smoking status: Never    Smokeless tobacco: Never   Vaping Use    Vaping status: Never Used   Substance Use Topics     Alcohol use: Never    Drug use: Yes     Types: Marijuana     Comment: Gummie edibles for pain and anxiety-WILL NOT TAKE 7 DAYS BEFORE DOS

## 2024-09-25 NOTE — Progress Notes (Signed)
 [facility] AT [facility] - A FOUNDING MEMBER OF [facility]  Medicare Wellness Visit               Claudia Adams is a 79 y.o. female who presents today for the following Medicare Wellness Visit: Annual Wellness Visit - Subsequent       Health Risk Assessment   During the past month, how would you rate your general health?:  Fair  Which of the following tasks can you do without assistance - drive or take the bus alone; shop for groceries or clothes; prepare your own meals; do your own housework/laundry; handle your own finances/pay bills; eat, bathe or get around your home?: Drive or take the bus alone, Prepare your own meals, Handle your own finances/pay bills, Do your own housework/laundry, Shop for groceries or clothes, Eat, bathe, dress or get around your home  Which of the following problems have you been bothered by in the past month - dizzy when standing up; problems using the phone; feeling tired or fatigued; moderate or severe body pain?: Feeling tired or fatigued, Moderate or severe body pain  Do you exercise for about 20 minutes 3 or more days per week?:No  During the past month was someone available to help if you needed and wanted help?  For example, if you felt nervous, lonely, got sick and had to stay in bed, needed someone to talk to, needed help with daily chores or needed help just taking care of yourself.: Yes  Do you always wear a seat belt?: Yes  Do you have any trouble taking medications the way you have been told to take them?: No  Have you been given any information that can help you with keeping track of your medications?: No  Do you have trouble paying for your medications?: No  Hospitalizations   Hospitalization within past year: No    Screenings         09/22/2024 09/25/2024   Ambulatory Screenings   Falls Risk: Terrilee more than 2 times in past year N  N   Falls Risk: Suffer any injuries? N  N       Patient-reported        Substance Use Disorder Screen:  Clark  reports that she  has never smoked. She has never used smokeless tobacco. She reports current drug use. Drug: Marijuana. She reports that she does not drink alcohol.            Functional Ability/Level of Safety   Falls Risk/Home Safety Assessment:  Have you been given any information that can help you with hazards in your house, such as scatter rugs, furniture, etc?: No  Do you worry about falling?: Yes  Have you fallen two or more times in the past year?: No (fell 1x in 12/2023 (tripped))  Did you suffer any injuries from your falls in the past year?: No    Gait and Balance:   Do you feel unsteady when standing or walking?: Yes (has curvature in spine)      Home Safety:   Low Risk for Falls    Hearing Assessment:  patient reports hearing within normal limits. Pt will have hearing tested within the next year per daughter's request     Visual Acuity:     If no visual acuity exam above, the patient has declined the visual acuity exam portion of this encounter.      Exercise:   Light ( i.e. stretching or slow walking ) and walking  Diet:  Diet: - consumes a well balanced diet      Activities of Daily Living   ADL's  Bathing: Independent  Dressing: Independent  Mobility: Independent  Transfer: Independent  Eating: Independent  Toileting: Independent    IADL's  Phone: Independent  Housekeeping: Independent  Laundry: Independent  Transportation: Independent  Medications: Independent  Finances: Independent    ADL assistance:   No assistance needed    Social Activities/Engagement   Frequency of Communication with Friends and Family:  often    Frequency of Social Gatherings with Friends and Family:   very often    Advanced Care Planning   Discussion of Advance Directives:   Has no Scientist, Water Quality. Form provided.         Exam   BP 142/74 (BP Site: Left arm, Patient Position: Sitting)   Pulse 72   Temp 97.9 F (36.6 C)   Resp 16   Ht 1.549 m (5' 1)   Wt 64.7 kg (142 lb 9.6 oz)   SpO2 96%   BMI 26.94 kg/m   Physical  Exam      Evaluation of Cognitive Function   Mood/affect: Appropriate  Appearance:  alert, well appearing, and in no distress  Family member/caregiver input: Not present in room    Mini-Cog Score:  3 recalled words - negative screen for dementia      Assessment/Plan     Assessment & Plan  Type 2 diabetes mellitus with other diabetic arthropathy, without long-term current use of insulin  (CMS/HCC)  Neuropathy  Essential hypertension  Hypothyroidism, unspecified type  Mixed hyperlipidemia  Chronic pain of both feet  Insomnia, unspecified type        Personalized Prevention Plan   The patient was provided with a personalized prevention plan via   the Patient Instructions tab of this visit     History/Care Team   Medical History[1]  Past Surgical History[2]  Family History[3]  Current Medications[4]    Patient Care Team:  Russella Delon RAMAN, MD as PCP - General (Family Medicine)  Ione Gun, MD as Consulting Physician (Rheumatology)  Orlando Johnathan SAUNDERS, FNP as Nurse Practitioner (Family Nurse Practitioner)  Meade Rend, MD as Consulting Physician (Cardiology)  Julio Camellia BIRCH, MD as Consulting Physician (Interventional Cardiology)    Additional Documentation         Verbal consent obtained to record this visit when ambient technology is utilized.         [1]   Past Medical History:  Diagnosis Date    Anemia     H/H 11.2 / 33 06/17/22    Anxiety     SITUATIONAL    Arrhythmia     Diabetes mellitus (CMS/HCC) 25 years    Disorder of thyroid      Ear, nose and throat disorder     ALLERGIES    Hyperlipidemia     Hypertension 25 years ago    Hypothyroidism     MANAGED BY ENDOCRINOL- STABLE    Neuropathy     CONTROLLED WITH GABAPENTIN     Other specified arthritis, right shoulder     PREOP DIAGNOSIS    Pulmonary hypertension (CMS/HCC)     Renal insufficiency     CKD 3 MONITORED BY PCP. CREAT 1.1 06/17/22    Rheumatoid arthritis (CMS/HCC)     DR IONE- LAST SEEN FOE REMICADE 04/2022    Seasonal allergic rhinitis     ALLEGRA     Type 2 diabetes mellitus, controlled (CMS/HCC)  MANAGED BY ENDOCRINOL NOTE 02/15/22 IN NOTES.  AM FBS 130-160. HA1C 02/15/22 6.2   [2]   Past Surgical History:  Procedure Laterality Date    ARTHROPLASTY SHOULDER REVERSE Right 07/01/2022    Procedure: ARTHROPLASTY, SHOULDER REVERSE;  Surgeon: Vicci Evalene RAMAN, MD;  Location: [facility];  Service: Orthopedics;  Laterality: Right;    EYE SURGERY      CATARCTS    FINGER SURGERY Right     INDEX    HYSTERECTOMY      KNEE SURGERY Right 11/30/2019    knee replacement by Dr. Alm Vicci    right reverse shoulder arthroplasty  06/2022    ROTATOR CUFF REPAIR Left     SHOULDER ARTHROSCOPY Right 07/01/2022    Total reverse shoulder    SHOULDER SURGERY Right 2019    Rotator cuff    SHOULDER SURGERY Right     PUT BACK IN SOCKET   [3]   Family History  Problem Relation Name Age of Onset    Diabetes Mother Larkin Potters     Heart failure Father Norleen Potters    [4]   Current Outpatient Medications:     Accu-Chek Guide Test test strip, USE 1 TEST STRIP DAILY FOR BLOOD GLUCOSE, Disp: 100 strip, Rfl: 0    amLODIPine  (NORVASC ) 5 MG tablet, TAKE 1 TABLET BY MOUTH EVERY DAY, Disp: 90 tablet, Rfl: 2    fexofenadine (ALLEGRA) 180 MG tablet, Take 1 tablet (180 mg) by mouth every morning, Disp: , Rfl:     gabapentin  (NEURONTIN ) 100 MG capsule, Take 1 capsule (100 mg) by mouth 3 (three) times daily, Disp: , Rfl:     hydroxychloroquine  (PLAQUENIL ) 200 MG tablet, Take 1 tablet (200 mg) by mouth every morning, Disp: , Rfl:     inFLIXimab (REMICADE IV), Infuse into the vein every 28 days LAST DOSE 8/213/23, NEXT DOSE AFTER SURGERY, Disp: , Rfl:     Insulin  Pen Needle (BD Pen Needle Nano 2nd Gen) 32G X 4 MM Misc, USE FOR INSULIN  INJECTION ONCE/DAY, Disp: , Rfl:     levothyroxine  (SYNTHROID ) 25 MCG tablet, Take 1 tablet (25 mcg) by mouth Once a day at 6:00am, Disp: 100 tablet, Rfl: 2    losartan -hydrochlorothiazide (HYZAAR) 100-25 MG per tablet, Take 1 tablet by mouth once every morning, Disp:  100 tablet, Rfl: 2    Multiple Vitamins-Minerals (Womens 50+ Multi Vitamin/Min) Tab, Take 1 tablet by mouth daily, Disp: , Rfl:     potassium chloride  (KLOR-CON  M) 10 MEQ CR tablet, TAKE 1 TABLET(10 MEQ) BY MOUTH DAILY, Disp: 30 tablet, Rfl: 0    rosuvastatin  (CRESTOR ) 20 MG tablet, Take 1 tablet (20 mg) by mouth once daily, Disp: 100 tablet, Rfl: 2    tirzepatide  (MOUNJARO ) 7.5 MG/0.5ML injection, Inject 7.5 mg into the skin once a week, Disp: 6 mL, Rfl: 1    traMADol  (ULTRAM ) 50 MG tablet, Take 1 tablet (50 mg) by mouth every 6 (six) hours as needed for Pain Take as needed and sparingly, Disp: 90 tablet, Rfl: 0    traZODone  (DESYREL ) 50 MG tablet, TAKE 1 TABLET(50 MG) BY MOUTH 1 TIME AT BEDTIME AS NEEDED, Disp: 30 tablet, Rfl: 2    acetaminophen  (TYLENOL ) 325 MG tablet, Take 1 tablet (325 mg) by mouth every 4 (four) hours (Patient not taking: Reported on 09/25/2024), Disp: , Rfl:     fluticasone  (FLONASE ) 50 MCG/ACT nasal spray, 1 spray in each nostril Nasally Once a day; Duration: 30 day(s), Disp: , Rfl:  metoprolol  succinate XL (TOPROL -XL) 50 MG 24 hr tablet, Take 1 tablet (50 mg) by mouth once every morning, Disp: 100 tablet, Rfl: 2

## 2024-09-26 ENCOUNTER — Ambulatory Visit (INDEPENDENT_AMBULATORY_CARE_PROVIDER_SITE_OTHER): Payer: Self-pay

## 2024-09-26 DIAGNOSIS — E039 Hypothyroidism, unspecified: Secondary | ICD-10-CM

## 2024-09-26 LAB — COMPREHENSIVE METABOLIC PANEL
ALT: 25 IU/L (ref 0–32)
AST (SGOT): 28 IU/L (ref 0–40)
Albumin: 4.2 g/dL (ref 3.8–4.8)
Alkaline Phosphatase: 73 IU/L (ref 49–135)
BUN / Creatinine Ratio: 16 (ref 12–28)
BUN: 19 mg/dL (ref 8–27)
Bilirubin, Total: 0.7 mg/dL (ref 0.0–1.2)
CO2: 26 mmol/L (ref 20–29)
Calcium: 9.6 mg/dL (ref 8.7–10.3)
Chloride: 102 mmol/L (ref 96–106)
Creatinine: 1.18 mg/dL — ABNORMAL HIGH (ref 0.57–1.00)
Globulin, Total: 2.5 g/dL (ref 1.5–4.5)
Glucose: 183 mg/dL — ABNORMAL HIGH (ref 70–99)
Potassium: 4 mmol/L (ref 3.5–5.2)
Protein, Total: 6.7 g/dL (ref 6.0–8.5)
Sodium: 143 mmol/L (ref 134–144)
eGFR: 47 mL/min/1.73 — ABNORMAL LOW (ref >59)

## 2024-09-26 LAB — LIPID PANEL
Cholesterol / HDL Ratio: 1.8 ratio (ref 0.0–4.4)
Cholesterol: 139 mg/dL (ref 100–199)
HDL: 79 mg/dL (ref >39)
LDL Chol Calculated (NIH): 45 mg/dL (ref 0–99)
Triglycerides: 76 mg/dL (ref 0–149)
VLDL Calculated: 15 mg/dL (ref 5–40)

## 2024-09-26 LAB — HEMOGLOBIN A1C: Hemoglobin A1C: 6.3 % — ABNORMAL HIGH (ref 4.8–5.6)

## 2024-09-26 LAB — T4, FREE: T4, Free: 1.2 ng/dL (ref 0.82–1.77)

## 2024-09-26 LAB — TSH: TSH: 2.97 u[IU]/mL (ref 0.450–4.500)

## 2024-09-26 MED ORDER — LEVOTHYROXINE SODIUM 25 MCG PO TABS: 25.0000 ug | ORAL_TABLET | Freq: Every day | ORAL | 1 refills | Status: AC

## 2024-09-27 ENCOUNTER — Encounter (INDEPENDENT_AMBULATORY_CARE_PROVIDER_SITE_OTHER): Payer: Self-pay

## 2024-10-08 ENCOUNTER — Other Ambulatory Visit (INDEPENDENT_AMBULATORY_CARE_PROVIDER_SITE_OTHER): Payer: Self-pay | Admitting: Family

## 2024-10-08 DIAGNOSIS — E876 Hypokalemia: Secondary | ICD-10-CM
